# Patient Record
Sex: Female | Born: 1940 | Race: White | Hispanic: No | Smoking: Former smoker
Health system: Southern US, Community
[De-identification: ages and names within clinical notes are randomized; demographics above are authoritative.]

## PROBLEM LIST (undated history)

## (undated) DIAGNOSIS — E7800 Pure hypercholesterolemia, unspecified: Secondary | ICD-10-CM

## (undated) DIAGNOSIS — F32A Depression, unspecified: Secondary | ICD-10-CM

## (undated) DIAGNOSIS — E119 Type 2 diabetes mellitus without complications: Secondary | ICD-10-CM

## (undated) DIAGNOSIS — I10 Essential (primary) hypertension: Secondary | ICD-10-CM

## (undated) DIAGNOSIS — I4891 Unspecified atrial fibrillation: Secondary | ICD-10-CM

## (undated) DIAGNOSIS — I480 Paroxysmal atrial fibrillation: Secondary | ICD-10-CM

## (undated) DIAGNOSIS — N189 Chronic kidney disease, unspecified: Secondary | ICD-10-CM

## (undated) DIAGNOSIS — F329 Major depressive disorder, single episode, unspecified: Secondary | ICD-10-CM

## (undated) DIAGNOSIS — I639 Cerebral infarction, unspecified: Secondary | ICD-10-CM

## (undated) DIAGNOSIS — I219 Acute myocardial infarction, unspecified: Secondary | ICD-10-CM

## (undated) DIAGNOSIS — G4733 Obstructive sleep apnea (adult) (pediatric): Secondary | ICD-10-CM

## (undated) DIAGNOSIS — K219 Gastro-esophageal reflux disease without esophagitis: Secondary | ICD-10-CM

## (undated) DIAGNOSIS — E1142 Type 2 diabetes mellitus with diabetic polyneuropathy: Secondary | ICD-10-CM

## (undated) DIAGNOSIS — I5189 Other ill-defined heart diseases: Secondary | ICD-10-CM

## (undated) DIAGNOSIS — G4730 Sleep apnea, unspecified: Secondary | ICD-10-CM

## (undated) DIAGNOSIS — I2510 Atherosclerotic heart disease of native coronary artery without angina pectoris: Secondary | ICD-10-CM

## (undated) DIAGNOSIS — Z955 Presence of coronary angioplasty implant and graft: Secondary | ICD-10-CM

## (undated) HISTORY — PX: CYSTOSCOPY: SUR368

---

## 2009-03-10 ENCOUNTER — Ambulatory Visit: Payer: Self-pay | Admitting: Unknown Physician Specialty

## 2009-03-30 ENCOUNTER — Ambulatory Visit: Payer: Self-pay | Admitting: Unknown Physician Specialty

## 2009-04-14 ENCOUNTER — Encounter: Payer: Self-pay | Admitting: Podiatrist

## 2009-04-16 ENCOUNTER — Ambulatory Visit: Payer: Self-pay | Admitting: Unknown Physician Specialty

## 2009-05-17 ENCOUNTER — Ambulatory Visit: Payer: Self-pay | Admitting: Unknown Physician Specialty

## 2009-07-18 ENCOUNTER — Ambulatory Visit: Payer: Self-pay | Admitting: Family Medicine

## 2009-12-07 ENCOUNTER — Ambulatory Visit: Payer: Self-pay | Admitting: Family Medicine

## 2010-10-02 ENCOUNTER — Ambulatory Visit: Payer: Self-pay | Admitting: Internal Medicine

## 2011-06-14 ENCOUNTER — Ambulatory Visit: Payer: Self-pay | Admitting: Internal Medicine

## 2011-06-21 DIAGNOSIS — I10 Essential (primary) hypertension: Secondary | ICD-10-CM | POA: Insufficient documentation

## 2011-06-21 DIAGNOSIS — E7800 Pure hypercholesterolemia, unspecified: Secondary | ICD-10-CM | POA: Insufficient documentation

## 2011-06-21 DIAGNOSIS — F411 Generalized anxiety disorder: Secondary | ICD-10-CM | POA: Insufficient documentation

## 2011-09-26 ENCOUNTER — Ambulatory Visit: Payer: Self-pay | Admitting: Family Medicine

## 2011-09-28 DIAGNOSIS — E1142 Type 2 diabetes mellitus with diabetic polyneuropathy: Secondary | ICD-10-CM | POA: Insufficient documentation

## 2011-09-28 DIAGNOSIS — E1129 Type 2 diabetes mellitus with other diabetic kidney complication: Secondary | ICD-10-CM | POA: Insufficient documentation

## 2011-09-28 DIAGNOSIS — K219 Gastro-esophageal reflux disease without esophagitis: Secondary | ICD-10-CM | POA: Insufficient documentation

## 2011-11-23 DIAGNOSIS — M1710 Unilateral primary osteoarthritis, unspecified knee: Secondary | ICD-10-CM | POA: Insufficient documentation

## 2013-01-19 DIAGNOSIS — M8580 Other specified disorders of bone density and structure, unspecified site: Secondary | ICD-10-CM | POA: Insufficient documentation

## 2013-07-08 ENCOUNTER — Ambulatory Visit: Payer: Self-pay | Admitting: Family Medicine

## 2013-09-26 ENCOUNTER — Inpatient Hospital Stay: Payer: Self-pay | Admitting: Internal Medicine

## 2013-09-26 LAB — CBC
HCT: 46.5 % (ref 35.0–47.0)
MCH: 28.6 pg (ref 26.0–34.0)
MCHC: 33.8 g/dL (ref 32.0–36.0)
MCV: 85 fL (ref 80–100)
Platelet: 249 10*3/uL (ref 150–440)
RBC: 5.49 10*6/uL — ABNORMAL HIGH (ref 3.80–5.20)
RDW: 14.1 % (ref 11.5–14.5)

## 2013-09-26 LAB — TROPONIN I: Troponin-I: <0.02 ng/mL

## 2013-09-26 LAB — URINALYSIS, COMPLETE
Bacteria: NONE SEEN
Blood: NEGATIVE
Glucose,UR: NEGATIVE mg/dL (ref 0–75)
Ketone: NEGATIVE
Nitrite: NEGATIVE
Ph: 7 (ref 4.5–8.0)
RBC,UR: 2 /HPF (ref 0–5)

## 2013-09-26 LAB — COMPREHENSIVE METABOLIC PANEL
Albumin: 3.9 g/dL (ref 3.4–5.0)
Alkaline Phosphatase: 121 U/L (ref 50–136)
Anion Gap: 10 (ref 7–16)
Bilirubin,Total: 0.3 mg/dL (ref 0.2–1.0)
Co2: 26 mmol/L (ref 21–32)
EGFR (Non-African Amer.): >60
Glucose: 153 mg/dL — ABNORMAL HIGH (ref 65–99)
Osmolality: 283 (ref 275–301)
Potassium: 2.9 mmol/L — ABNORMAL LOW (ref 3.5–5.1)
Sodium: 140 mmol/L (ref 136–145)

## 2013-09-26 LAB — MAGNESIUM: Magnesium: 0.9 mg/dL — ABNORMAL LOW

## 2013-09-27 LAB — BASIC METABOLIC PANEL
Calcium, Total: 8.5 mg/dL (ref 8.5–10.1)
Creatinine: 0.93 mg/dL (ref 0.60–1.30)
EGFR (Non-African Amer.): >60
Glucose: 171 mg/dL — ABNORMAL HIGH (ref 65–99)
Potassium: 3.5 mmol/L (ref 3.5–5.1)
Sodium: 141 mmol/L (ref 136–145)

## 2013-09-27 LAB — CBC WITH DIFFERENTIAL/PLATELET
Eosinophil #: 0.1 10*3/uL (ref 0.0–0.7)
HGB: 14.3 g/dL (ref 12.0–16.0)
Lymphocyte %: 11.8 %
MCH: 28.5 pg (ref 26.0–34.0)
Neutrophil #: 7.3 10*3/uL — ABNORMAL HIGH (ref 1.4–6.5)
Neutrophil %: 76.1 %
Platelet: 239 10*3/uL (ref 150–440)
RBC: 5.02 10*6/uL (ref 3.80–5.20)
RDW: 13.9 % (ref 11.5–14.5)
WBC: 9.7 10*3/uL (ref 3.6–11.0)

## 2013-09-27 LAB — CK TOTAL AND CKMB (NOT AT ARMC)
CK, Total: 44 U/L (ref 21–215)
CK-MB: 1 ng/mL (ref 0.5–3.6)

## 2013-09-27 LAB — TROPONIN I
Troponin-I: 0.04 ng/mL
Troponin-I: <0.02 ng/mL

## 2013-09-27 LAB — MAGNESIUM: Magnesium: 1.7 mg/dL — ABNORMAL LOW

## 2013-12-07 ENCOUNTER — Ambulatory Visit: Payer: Self-pay | Admitting: Family Medicine

## 2013-12-07 LAB — URINALYSIS, COMPLETE
Bilirubin,UR: NEGATIVE
Glucose,UR: NEGATIVE mg/dL (ref 0–75)
Ketone: NEGATIVE
Ph: 6.5 (ref 4.5–8.0)
Protein: 100
Specific Gravity: 1.01 (ref 1.003–1.030)
Squamous Epithelial: NONE SEEN

## 2013-12-09 LAB — URINE CULTURE

## 2014-01-20 DIAGNOSIS — I4891 Unspecified atrial fibrillation: Secondary | ICD-10-CM | POA: Insufficient documentation

## 2014-01-20 DIAGNOSIS — M19039 Primary osteoarthritis, unspecified wrist: Secondary | ICD-10-CM | POA: Insufficient documentation

## 2014-01-20 DIAGNOSIS — I480 Paroxysmal atrial fibrillation: Secondary | ICD-10-CM | POA: Insufficient documentation

## 2014-02-22 ENCOUNTER — Other Ambulatory Visit: Payer: Self-pay | Admitting: Cardiology

## 2014-02-22 LAB — TROPONIN I: Troponin-I: <0.02 ng/mL

## 2014-03-29 ENCOUNTER — Ambulatory Visit: Payer: Self-pay | Admitting: Physician Assistant

## 2014-03-29 LAB — URIC ACID: URIC ACID: 7 mg/dL — AB (ref 2.6–6.0)

## 2014-11-08 DIAGNOSIS — N329 Bladder disorder, unspecified: Secondary | ICD-10-CM | POA: Insufficient documentation

## 2015-04-08 NOTE — Discharge Summary (Signed)
PATIENT NAME:  Vanessa Young, Vanessa Young MR#:  [id number] DATE OF BIRTH:  [birth year]  DATE OF ADMISSION:  09/26/2013 DATE OF DISCHARGE:  09/27/2013  ADMISSION DIAGNOSIS: New-onset atrial fibrillation.   DISCHARGE DIAGNOSIS:  New-onset atrial fibrillation.   CONSULTATIONS: Cardiology.   IMAGING AND RADIOLOGIC DATA:   1.  A 2-D echocardiogram is pending and will be followed up with Dr. Ubaldo Glassing.  2.  Troponins were negative.  3.  Discharge white blood cells 9.7, hemoglobin 14, hematocrit 43, platelets 239.  4.  Sodium 141, potassium 3.5, chloride 106, bicarb 28, BUN 12, creatinine 0.93, glucose 171.  5.  TSH was within normal limits.   HOSPITAL COURSE: This is a 74 year old female with new-onset atrial fibrillation. For further details, please refer to the H and P.  1.  Atrial fibrillation. The patient was given diltiazem x 2 in the ER. She was converted to normal sinus rhythm. Cardiology was consulted. Echocardiogram was ordered. The unofficial read of the echocardiogram is that it is essentially normal. She was placed on telemetry where she did not have any bouts of atrial fibrillation. Her CHADSS-VASC score is 4, therefore, cardiology recommended Xarelto 20 mg daily. I have explained the risks, benefits and alternatives of blood thinners such as Xarelto with the patient; risks including bleeding and hemorrhage. The patient understands these risks and is willing to take the risk. Her echo unofficial report does show a normal ejection fraction and no valvular abnormalities. She is also being discharged on Cardizem.    2.  History of hypertension. The patient will stop atenolol. Continue Cardizem, continue losartan and guanfacine.  Further followup with Dr. Ubaldo Glassing.  3.  Hyperlipidemia, on Crestor.  4.  Hypomagnesemia which was repleted.  5.  Diabetes. The patient will continue outpatient medications.   DISCHARGE MEDICATIONS: 1.  Metformin 500 mg daily.  2.  Losartan 100 mg daily.  3.  Fish oil 1 tablet b.i.d.   4.  Vitamin D3 1 tablet b.i.d.  5.  Guanfacine 4 mg daily.  6.  Lexapro 5 mg Monday, Wednesday, Friday.  7.  Crestor 5 mg Monday and Thursday.  8.  Omeprazole 10 mg in the evening.  9.  Xarelto 20 mg daily.  10.  Diltiazem 180 mg daily.  11.  Magnesium oxide 400 mg daily.   DISCHARGE DIET: Low sodium, ADA diet.   DISCHARGE ACTIVITY: As tolerated.   DISCHARGE FOLLOWUP: The patient will follow up Dr. Ubaldo Glassing in 2 week and Dr. Algie Coffer, her primary care physician, in 2 weeks.   TIME SPENT: Approximately 35 minutes.   The patient is medically stable for discharge.   ____________________________ Sheela Mcculley P. Benjie Karvonen, MD spm:cs D: 09/27/2013 12:49:00 ET T: 09/27/2013 14:36:21 ET JOB#: [id number]  cc: Chrissie Noa A. Ubaldo Glassing, MD Deshonda Cryderman P. Benjie Karvonen, MD, <Dictator>  Donell Beers Chastin Garlitz MD ELECTRONICALLY SIGNED 09/27/2013 19:59

## 2015-04-08 NOTE — Consult Note (Signed)
   Present Illness 74 yo female with history of diabetes melitus, hypertension who was admitted with rapid irregular heart rate and noted to be in atrial fibrillation. She recieved iv cardizem and converted to nsr. Her serum magnesium was 0.9 and her serum potassium was 2.9. She had been on 10 mg of oral lasix for her blood pressure after being taken off of hctz due to gout flare up. She denies chest pain, syncope or presyncope. SHe is currently stable in nsr. No contraindication to anticoagulation. Her CHADSS score is 2-3 and her CHADSS-VaSC is 4.   Physical Exam:  GEN no acute distress   HEENT PERRL, hearing intact to voice   NECK No masses   RESP normal resp effort  clear BS  no use of accessory muscles   CARD Regular rate and rhythm  No murmur   ABD denies tenderness  normal BS   LYMPH negative neck   EXTR negative edema   SKIN normal to palpation   NEURO cranial nerves intact, motor/sensory function intact   PSYCH A+O to time, place, person   Review of Systems:  General: No Complaints   Skin: No Complaints   ENT: No Complaints   Eyes: No Complaints   Neck: No Complaints   Respiratory: No Complaints   Cardiovascular: No Complaints   Gastrointestinal: No Complaints   Genitourinary: No Complaints   Vascular: No Complaints   Musculoskeletal: No Complaints   Neurologic: No Complaints   Hematologic: No Complaints   Endocrine: No Complaints   Psychiatric: No Complaints   Review of Systems: All other systems were reviewed and found to be negative   Medications/Allergies Reviewed Medications/Allergies reviewed   EKG:  EKG NSR   Interpretation sinus rhtyhm currently. Afib with rvr on admission    Penicillin: Hives  Keflex: Unknown   Impression 74 yo female with history of hypertension and diabetes melitus who developed afib with rvr. APpears to be in the setting of hypokalemia and hypomagnasemia. Has ruled out for mi and is currently in nsr on  cardeizem. Will repleat ka dn mg and discontinue atenolol and continue with cardizem cd 180 daily. Discussed risk and benefits of anticoagulation with warfarin vs novel agents. CHADSS score is 2-3 (age 81), and CHADSS-VASc of 4. Will place on Xarelto. 20 mg dialy for now. ECHO revealed normal lv function with normal atrial size and no significant valvular abnormalities.   Plan 1. OK for discharge on Cardizem CD 180 mg daily; Losartan 100 mg daily; Magnesium oxylate 250 mg daily; Xarelto 20 mg daily 2. Discontinue lasix and atenolol from admission meds 3. Ambulate and discharge to home. Will follow up as outpatient if desired.   Electronic Signatures: Teodoro Spray (MD)  (Signed 12-Oct-14 12:10)  Authored: General Aspect/Present Illness, History and Physical Exam, Review of System, EKG , Allergies, Impression/Plan   Last Updated: 12-Oct-14 12:10 by Teodoro Spray (MD)

## 2015-04-08 NOTE — H&P (Signed)
PATIENT NAME:  Vanessa Young, Vanessa Young MR#:  [id number] DATE OF BIRTH:  Apr 25, 1941  DATE OF ADMISSION:  09/26/2013  PRIMARY CARE PHYSICIAN: Dr. Myrtie Soman, [facility]   CHIEF COMPLAINT: Palpitations.   HISTORY OF PRESENT ILLNESS: This is a 74 year old female who presents to the hospital, as she developed palpitations earlier this afternoon around 2:00. The patient was in her usual state of health, sitting in her living room watching TV, when she started to feel palpitations and she became a bit short of breath. She was a bit concerned; therefore, came to the ER for further evaluation. At triage, the patient was noted to be in SVT and rapid AFib, with heart rates in the 160s. The patient was given 2 doses of IV Cardizem, and since then has converted to a normal sinus rhythm. The patient denied any chest pain with these symptoms. She denies any dizziness. She denied any nausea, vomiting, any numbness, tingling, or any other associated symptoms presently. The patient does not have any previous history of atrial fibrillation or SVT. The patient does say that her blood pressures have been significantly uncontrolled, so her primary care physician has been increasing her antihypertensive meds over the past few weeks.   REVIEW OF SYSTEMS:   CONSTITUTIONAL: No documented fever. No weight gain or weight loss.  EYES: No blurry or double vision.  ENT: No tinnitus. No postnasal drip. No redness of the oropharynx.  RESPIRATORY: No cough, no wheeze, no hemoptysis. Positive dyspnea.  CARDIOVASCULAR: No chest pain, no orthopnea. Positive palpitations. No true syncope.  GASTROINTESTINAL: No nausea, vomiting, diarrhea. No abdominal pain. No melena or hematochezia.  GENITOURINARY: No dysuria, no hematuria.  ENDOCRINE: No polyuria or nocturia. No heat or cold intolerance.  HEMATOLOGIC: No anemia, no bruising, no bleeding.  INTEGUMENT: No rashes. No lesions.  MUSCULOSKELETAL: No arthritis, no swelling, no gout.   NEUROLOGIC: No numbness or tingling. No ataxia. No seizure-type activity.  PSYCHIATRIC: No anxiety. No insomnia. No ADD. Positive depression.   PAST MEDICAL HISTORY: Consistent with diabetes, hypertension, hyperlipidemia, GERD, depression.   ALLERGIES: KEFLEX and PENICILLIN, which cause a rash.   SOCIAL HISTORY: Does have a history of smoking, but quit 20+ years ago. No alcohol abuse. No illicit drug abuse. Lives at home by herself.   FAMILY HISTORY: Mother and father are both deceased. Mother died from a cerebral hemorrhage. Father died from heart disease.   CURRENT MEDICATIONS ARE AS FOLLOWS:  Atenolol 25 mg in the morning, 50 mg in the evening. Metformin 500 mg daily. Lasix 10 mg daily. Losartan 100 mg daily. Fish oil b.i.d. Vitamin D twice daily. Lexapro 5 mg at bedtime. Crestor 5 mg on Mondays and Thursdays only. Tenex 4 mg daily. Prilosec 20 mg daily.   PHYSICAL EXAMINATION PRESENTLY IS AS FOLLOWS: VITAL SIGNS ARE NOTED TO BE: Temperature 98.2, pulse 82, respirations 18, blood pressure 185/92, sats 99% on room air.  GENERAL: She is a pleasant-appearing female in no apparent distress.  HEAD EYES, EARS, NOSE, THROAT EXAM: The patient is atraumatic, normocephalic. Extraocular muscles are intact. Pupils equal and reactive to light. Sclerae anicteric. No conjunctival injection. No pharyngeal erythema.  NECK: Supple. There is no jugular venous distention, no bruits, no lymphadenopathy, no thyromegaly.  HEART EXAM: Regular rate and rhythm. No murmurs. No rubs. No clicks.  LUNGS: Clear to auscultation bilaterally. No rales or rhonchi. No wheezes.  ABDOMEN: Soft, flat, nontender, nondistended. Has good bowel sounds. No hepatosplenomegaly appreciated.  EXTREMITIES: No evidence of any cyanosis, clubbing,  or peripheral edema. Has +2 pedal and radial pulses bilaterally.  NEUROLOGIC: The patient is alert, awake, oriented x 3, with no focal motor or sensory defecits  bilaterally.  SKIN: Moist and  warm, with no rashes appreciated.  LYMPHATIC: There is no cervical or axillary lymphadenopathy.   LABORATORY EXAM: Shows serum glucose of 153, BUN 14, creatinine 0.8, sodium 140, potassium 2.9, chloride 104, bicarb 26, magnesium is 0.9. The patient's LFTs are within normal limits. Troponin less than 0.02. White cell count 9.5, hemoglobin 15.7, hematocrit 46.5, platelet count 249. Urinalysis within normal limits.   The patient did have a chest x-ray done, which showed no acute cardiopulmonary disease of the chest.   ASSESSMENT AND PLAN: This is a 74 year old female with a history of diabetes, hypertension, depression, gastroesophageal reflux disease, hyperlipidemia, who presents to the hospital due to palpitations, and noted to be in new-onset atrial fibrillation.   1.  New-onset atrial fibrillation. This is likely cause of patient's palpitations. The exact etiology of the AFib is unclear, whether it is related to uncontrolled hypertension. The patient has now converted to a normal sinus rhythm after 2 doses of IV Cardizem. For now, I will continue atenolol and oral Cardizem for rate control. The patient's CHADS-VASC score is 2. She has an annual 4% risk for stroke. For now, I will continue the patient on just some aspirin. Await further cardiology evaluation for long-term anticoagulation. Will get a 2-dimensional echocardiogram, cycle her cardiac enzymes. I discussed the case with Dr. Ubaldo Glassing, who will see the patient tomorrow. Keep her on telemetry for now.   2.  Diabetes. The patient's blood sugars are under stable control. I will continue her metformin and place her on some sliding scale insulin. Continue with carb-control diet.   3.  Hypertension. Continue atenolol, continue Tenex, continue losartan.   4.  Gastroesophageal reflux disease. Continue Prilosec.    5.  Hyperlipidemia. Continue Crestor.  6.  Hypokalemia and hypomagnesemia. Will replace the electrolytes accordingly with both IV and oral  magnesium and potassium, and repeat levels in the morning.   The patient is a FULL CODE.   Time spent on admission is 50 minutes.     ____________________________ Belia Heman. Verdell Carmine, MD vjs:mr D: 09/26/2013 17:46:00 ET T: 09/26/2013 18:50:25 ET JOB#: [id number]  cc: Belia Heman. Verdell Carmine, MD, <Dictator> Henreitta Leber MD ELECTRONICALLY SIGNED 09/27/2013 13:37

## 2015-05-22 ENCOUNTER — Encounter: Payer: Self-pay | Admitting: Gynecology

## 2015-05-22 ENCOUNTER — Ambulatory Visit
Admission: EM | Admit: 2015-05-22 | Discharge: 2015-05-22 | Disposition: A | Discharge status: Home / Self Care | Payer: Medicare Other | Attending: Family Medicine | Admitting: Family Medicine

## 2015-05-22 DIAGNOSIS — A499 Bacterial infection, unspecified: Secondary | ICD-10-CM

## 2015-05-22 DIAGNOSIS — N390 Urinary tract infection, site not specified: Secondary | ICD-10-CM

## 2015-05-22 DIAGNOSIS — B9689 Other specified bacterial agents as the cause of diseases classified elsewhere: Secondary | ICD-10-CM

## 2015-05-22 DIAGNOSIS — N760 Acute vaginitis: Secondary | ICD-10-CM | POA: Insufficient documentation

## 2015-05-22 HISTORY — DX: Unspecified atrial fibrillation: I48.91

## 2015-05-22 HISTORY — DX: Essential (primary) hypertension: I10

## 2015-05-22 HISTORY — DX: Pure hypercholesterolemia, unspecified: E78.00

## 2015-05-22 HISTORY — DX: Gastro-esophageal reflux disease without esophagitis: K21.9

## 2015-05-22 HISTORY — DX: Type 2 diabetes mellitus without complications: E11.9

## 2015-05-22 HISTORY — DX: Major depressive disorder, single episode, unspecified: F32.9

## 2015-05-22 HISTORY — DX: Depression, unspecified: F32.A

## 2015-05-22 LAB — URINALYSIS COMPLETE WITH MICROSCOPIC (ARMC ONLY)
GLUCOSE, UA: NEGATIVE mg/dL
NITRITE: POSITIVE — AB
PH: 6 (ref 5.0–8.0)
Specific Gravity, Urine: 1.02 (ref 1.005–1.030)

## 2015-05-22 LAB — WET PREP, GENITAL
Trich, Wet Prep: NONE SEEN
Yeast Wet Prep HPF POC: NONE SEEN

## 2015-05-22 MED ORDER — NITROFURANTOIN MONOHYD MACRO 100 MG PO CAPS: 100.0000 mg | ORAL_CAPSULE | Freq: Two times a day (BID) | ORAL | Status: DC

## 2015-05-22 MED ORDER — PHENAZOPYRIDINE HCL 100 MG PO TABS: 200.0000 mg | ORAL_TABLET | Freq: Three times a day (TID) | ORAL | Status: DC | PRN

## 2015-05-22 MED ORDER — METRONIDAZOLE 0.75 % VA GEL: 1.0000 | Freq: Every day | VAGINAL | Status: DC

## 2015-05-22 NOTE — ED Provider Notes (Signed)
CSN: [id number]     Arrival date & time 05/22/15  0935 History   First MD Initiated Contact with Patient 05/22/15 1014     Chief Complaint  Patient presents with  . Urinary Tract Infection  . Vaginitis   (Consider location/radiation/quality/duration/timing/severity/associated sxs/prior Treatment) HPI Comments: Caucasian female with new diagnosis bladder cancer last year has cystoscopy and lesion ablation with urology and follow up CT scan scheduled for tomorrow.  Urinary odor started Friday 3 Jun, suprapubic pain, cloudy urine, headache for one week, urinary frequency and suprapubic pressure.  Last vaginal yeast infection 2008 monistat cleared but no improvement with OTC treatment x 24 hours  Patient is a 74 y.o. female presenting with urinary tract infection. The history is provided by the patient.  Urinary Tract Infection Pain severity:  Mild Onset quality:  Sudden Duration:  3 days Timing:  Constant Progression:  Unchanged Chronicity:  New Recent urinary tract infections: no   Relieved by:  Nothing Worsened by:  Nothing tried Ineffective treatments:  Cranberry juice Urinary symptoms: foul-smelling urine and frequent urination   Urinary symptoms: no discolored urine, no hematuria, no hesitancy and no bladder incontinence   Associated symptoms: abdominal pain and vaginal discharge   Associated symptoms: no fever, no flank pain, no genital lesions, no nausea and no vomiting   Abdominal pain:    Location:  Suprapubic   Quality:  Pressure   Severity:  Mild   Onset quality:  Sudden   Duration:  3 days   Timing:  Constant   Progression:  Unchanged   Chronicity:  New Vaginal discharge:    Quality:  Thick and white   Severity:  Mild   Onset quality:  Sudden   Duration:  1 day   Timing:  Constant   Progression:  Unchanged   Chronicity:  New Risk factors: sexually active   Risk factors: no hx of pyelonephritis, no hx of urolithiasis, no kidney transplant, not pregnant, no  recurrent urinary tract infections, no renal cysts, no renal disease, not single kidney, no sexually transmitted infections and no urinary catheter     Past Medical History  Diagnosis Date  . A-fib   . Hypertension   . Diabetes mellitus without complication   . GERD (gastroesophageal reflux disease)   . Hypercholesteremia   . Depression    Past Surgical History  Procedure Laterality Date  . Cystoscopy     No family history on file. History  Substance Use Topics  . Smoking status: Never Smoker   . Smokeless tobacco: Not on file  . Alcohol Use: No   OB History    No data available     Review of Systems  Constitutional: Negative for fever, chills, diaphoresis, activity change, appetite change and fatigue.  HENT: Negative for congestion, ear pain, mouth sores and trouble swallowing.   Eyes: Negative for pain, discharge, redness and itching.  Gastrointestinal: Positive for abdominal pain. Negative for nausea, vomiting, diarrhea, constipation, blood in stool, abdominal distention and rectal pain.  Endocrine: Negative for cold intolerance and heat intolerance.  Genitourinary: Positive for dysuria, frequency and vaginal discharge. Negative for urgency, hematuria, flank pain, decreased urine volume, vaginal bleeding, difficulty urinating, genital sores, vaginal pain, menstrual problem and pelvic pain.  Musculoskeletal: Negative for myalgias, back pain, joint swelling, arthralgias, gait problem, neck pain and neck stiffness.  Skin: Negative for color change, pallor, rash and wound.  Allergic/Immunologic: Negative for environmental allergies and food allergies.  Neurological: Positive for headaches. Negative for dizziness, tremors, syncope,  facial asymmetry, weakness and light-headedness.  Hematological: Negative for adenopathy. Does not bruise/bleed easily.  Psychiatric/Behavioral: Negative for behavioral problems, confusion, sleep disturbance and agitation.    Allergies  Keflex and  Penicillins  Home Medications   Prior to Admission medications   Medication Sig Start Date End Date Taking? Authorizing Provider  diltiazem (CARDIZEM) 120 MG tablet Take 120 mg by mouth 4 (four) times daily.   Yes Historical Provider, MD  escitalopram (LEXAPRO) 5 MG tablet Take 5 mg by mouth daily.   Yes Historical Provider, MD  guanFACINE (TENEX) 1 MG tablet Take 1 mg by mouth at bedtime.   Yes Historical Provider, MD  losartan (COZAAR) 100 MG tablet Take 100 mg by mouth daily.   Yes Historical Provider, MD  magnesium oxide (MAG-OX) 400 MG tablet Take 400 mg by mouth daily.   Yes Historical Provider, MD  metFORMIN (GLUMETZA) 500 MG (MOD) 24 hr tablet Take 500 mg by mouth daily with breakfast.   Yes Historical Provider, MD  omeprazole (PRILOSEC) 20 MG capsule Take 20 mg by mouth daily.   Yes Historical Provider, MD  rivaroxaban (XARELTO) 20 MG TABS tablet Take 20 mg by mouth daily with supper.   Yes Historical Provider, MD  rosuvastatin (CRESTOR) 5 MG tablet Take 5 mg by mouth daily.   Yes Historical Provider, MD  metroNIDAZOLE (METROGEL) 0.75 % vaginal gel Place 1 Applicatorful vaginally at bedtime. For 7 days 05/22/15   Olen Cordial, NP  nitrofurantoin, macrocrystal-monohydrate, (MACROBID) 100 MG capsule Take 1 capsule (100 mg total) by mouth 2 (two) times daily. 05/22/15   Olen Cordial, NP  phenazopyridine (PYRIDIUM) 100 MG tablet Take 2 tablets (200 mg total) by mouth 3 (three) times daily as needed for pain. 05/22/15   Aura Fey Betancourt, NP   BP 145/76 mmHg  Pulse 81  Temp(Src) 98.4 F (36.9 C) (Oral)  Ht 4\' 10"  (1.473 m)  Wt 137 lb (62.143 kg)  BMI 28.64 kg/m2  SpO2 98% Physical Exam  Constitutional: She is oriented to person, place, and time. Vital signs are normal. She appears well-developed and well-nourished.  HENT:  Head: Normocephalic and atraumatic.  Right Ear: External ear normal.  Left Ear: External ear normal.  Nose: Nose normal.  Mouth/Throat: Oropharynx is  clear and moist. No oropharyngeal exudate.  Eyes: Conjunctivae, EOM and lids are normal. Pupils are equal, round, and reactive to light. Right eye exhibits no discharge. Left eye exhibits no discharge. No scleral icterus.  Neck: Trachea normal and normal range of motion. Neck supple. No tracheal deviation present. No thyromegaly present.  Cardiovascular: Normal rate, regular rhythm, normal heart sounds and intact distal pulses.  Exam reveals no gallop and no friction rub.   No murmur heard. Pulmonary/Chest: Effort normal and breath sounds normal. No respiratory distress. She has no wheezes. She has no rales. She exhibits no tenderness.  Abdominal: Soft. Bowel sounds are normal. She exhibits no shifting dullness, no distension, no pulsatile liver, no fluid wave, no abdominal bruit, no ascites, no pulsatile midline mass and no mass. There is no hepatosplenomegaly. There is no tenderness. There is no rebound, no guarding and no CVA tenderness. No hernia. Hernia confirmed negative in the ventral area.  Dull to percussion x 4 quads  Genitourinary:    Pelvic exam was performed with patient supine. No labial fusion. There is rash on the right labia. There is no tenderness, lesion or injury on the right labia. There is rash on the left labia. There is  no tenderness, lesion or injury on the left labia. No erythema, tenderness or bleeding in the vagina. No foreign body around the vagina. No signs of injury around the vagina. Vaginal discharge found.  Chaperoned by RN Marni Griffon thin coating vaginal walls slight odor cervix with a little thicker discharge at base white slight excoriation vaginal walls  Musculoskeletal: Normal range of motion. She exhibits no edema.  Lymphadenopathy:    She has no cervical adenopathy.       Right: No inguinal adenopathy present.       Left: No inguinal adenopathy present.  Neurological: She is alert and oriented to person, place, and time. She displays normal reflexes.  Coordination normal.  Skin: Skin is warm, dry and intact. No rash noted. No erythema. No pallor.  Psychiatric: She has a normal mood and affect. Her speech is normal and behavior is normal. Judgment and thought content normal. Cognition and memory are normal.  Nursing note and vitals reviewed.   ED Course  Procedures (including critical care time) Labs Review Labs Reviewed  WET PREP, GENITAL - Abnormal; Notable for the following:    Clue Cells Wet Prep HPF POC FEW (*)    WBC, Wet Prep HPF POC MANY (*)    All other components within normal limits  URINALYSIS COMPLETEWITH MICROSCOPIC ([facility] ONLY) - Abnormal; Notable for the following:    APPearance CLOUDY (*)    Bilirubin Urine 1+ (*)    Ketones, ur TRACE (*)    Hgb urine dipstick 3+ (*)    Protein, ur >300 (*)    Nitrite POSITIVE (*)    Leukocytes, UA 3+ (*)    Bacteria, UA FEW (*)    Squamous Epithelial / LPF 0-5 (*)    All other components within normal limits  URINE CULTURE    Imaging Review No results found.   MDM   1. UTI (lower urinary tract infection)   2. Bacterial vaginosis    Patient given copy of urinalysis results and to follow up with urology this week.  Medications as directed.  Patient is also to push fluids and may use Pyridium po as needed. Call or return to clinic as needed if these symptoms worsen or fail to improve as anticipated.  Exitcare handout on cystitis given to patient Patient verbalized agreement and understanding of treatment plan and had no further questions at this time. P2:  Hydrate and cranberry juice  Patient to take metronidazole as directed avoid alcohol intake during entire therapy period.  Exitcare handout on bacterial vaginosis given to patient.  RTC if worsening symptoms or new symptoms.    Patient verbalized understanding and agreed with plan of care and had no further questions at this time.  Patient has taken metronidazole without side effects in the past    Olen Cordial,  NP 05/22/15 1816

## 2015-05-22 NOTE — Discharge Instructions (Signed)
Vaginitis Vaginitis is an inflammation of the vagina. It is most often caused by a change in the normal balance of the bacteria and yeast that live in the vagina. This change in balance causes an overgrowth of certain bacteria or yeast, which causes the inflammation. There are different types of vaginitis, but the most common types are:  Bacterial vaginosis.  Yeast infection (candidiasis).  Trichomoniasis vaginitis. This is a sexually transmitted infection (STI).  Viral vaginitis.  Atropic vaginitis.  Allergic vaginitis. CAUSES  The cause depends on the type of vaginitis. Vaginitis can be caused by:  Bacteria (bacterial vaginosis).  Yeast (yeast infection).  A parasite (trichomoniasis vaginitis)  A virus (viral vaginitis).  Low hormone levels (atrophic vaginitis). Low hormone levels can occur during pregnancy, breastfeeding, or after menopause.  Irritants, such as bubble baths, scented tampons, and feminine sprays (allergic vaginitis). Other factors can change the normal balance of the yeast and bacteria that live in the vagina. These include:  Antibiotic medicines.  Poor hygiene.  Diaphragms, vaginal sponges, spermicides, birth control pills, and intrauterine devices (IUD).  Sexual intercourse.  Infection.  Uncontrolled diabetes.  A weakened immune system. SYMPTOMS  Symptoms can vary depending on the cause of the vaginitis. Common symptoms include:  Abnormal vaginal discharge.  The discharge is white, gray, or yellow with bacterial vaginosis.  The discharge is thick, white, and cheesy with a yeast infection.  The discharge is frothy and yellow or greenish with trichomoniasis.  A bad vaginal odor.  The odor is fishy with bacterial vaginosis.  Vaginal itching, pain, or swelling.  Painful intercourse.  Pain or burning when urinating. Sometimes, there are no symptoms. TREATMENT  Treatment will vary depending on the type of infection.   Bacterial  vaginosis and trichomoniasis are often treated with antibiotic creams or pills.  Yeast infections are often treated with antifungal medicines, such as vaginal creams or suppositories.  Viral vaginitis has no cure, but symptoms can be treated with medicines that relieve discomfort. Your sexual partner should be treated as well.  Atrophic vaginitis may be treated with an estrogen cream, pill, suppository, or vaginal ring. If vaginal dryness occurs, lubricants and moisturizing creams may help. You may be told to avoid scented soaps, sprays, or douches.  Allergic vaginitis treatment involves quitting the use of the product that is causing the problem. Vaginal creams can be used to treat the symptoms. HOME CARE INSTRUCTIONS   Take all medicines as directed by your caregiver.  Keep your genital area clean and dry. Avoid soap and only rinse the area with water.  Avoid douching. It can remove the healthy bacteria in the vagina.  Do not use tampons or have sexual intercourse until your vaginitis has been treated. Use sanitary pads while you have vaginitis.  Wipe from front to back. This avoids the spread of bacteria from the rectum to the vagina.  Let air reach your genital area.  Wear cotton underwear to decrease moisture buildup.  Avoid wearing underwear while you sleep until your vaginitis is gone.  Avoid tight pants and underwear or nylons without a cotton panel.  Take off wet clothing (especially bathing suits) as soon as possible.  Use mild, non-scented products. Avoid using irritants, such as:  Scented feminine sprays.  Fabric softeners.  Scented detergents.  Scented tampons.  Scented soaps or bubble baths.  Practice safe sex and use condoms. Condoms may prevent the spread of trichomoniasis and viral vaginitis. SEEK MEDICAL CARE IF:   You have abdominal pain.  You  have a fever or persistent symptoms for more than 2-3 days.  You have a fever and your symptoms suddenly  get worse. Document Released: 09/30/2007 Document Revised: 08/27/2012 Document Reviewed: 05/15/2012 [facility] Patient Information 2015 [place], [place]. This information is not intended to replace advice given to you by your health care provider. Make sure you discuss any questions you have with your health care provider. Urinary Tract Infection Urinary tract infections (UTIs) can develop anywhere along your urinary tract. Your urinary tract is your body's drainage system for removing wastes and extra water. Your urinary tract includes two kidneys, two ureters, a bladder, and a urethra. Your kidneys are a pair of bean-shaped organs. Each kidney is about the size of your fist. They are located below your ribs, one on each side of your spine. CAUSES Infections are caused by microbes, which are microscopic organisms, including fungi, viruses, and bacteria. These organisms are so small that they can only be seen through a microscope. Bacteria are the microbes that most commonly cause UTIs. SYMPTOMS  Symptoms of UTIs may vary by age and gender of the patient and by the location of the infection. Symptoms in young women typically include a frequent and intense urge to urinate and a painful, burning feeling in the bladder or urethra during urination. Older women and men are more likely to be tired, shaky, and weak and have muscle aches and abdominal pain. A fever may mean the infection is in your kidneys. Other symptoms of a kidney infection include pain in your back or sides below the ribs, nausea, and vomiting. DIAGNOSIS To diagnose a UTI, your caregiver will ask you about your symptoms. Your caregiver also will ask to provide a urine sample. The urine sample will be tested for bacteria and white blood cells. White blood cells are made by your body to help fight infection. TREATMENT  Typically, UTIs can be treated with medication. Because most UTIs are caused by a bacterial infection, they usually can be  treated with the use of antibiotics. The choice of antibiotic and length of treatment depend on your symptoms and the type of bacteria causing your infection. HOME CARE INSTRUCTIONS  If you were prescribed antibiotics, take them exactly as your caregiver instructs you. Finish the medication even if you feel better after you have only taken some of the medication.  Drink enough water and fluids to keep your urine clear or pale yellow.  Avoid caffeine, tea, and carbonated beverages. They tend to irritate your bladder.  Empty your bladder often. Avoid holding urine for long periods of time.  Empty your bladder before and after sexual intercourse.  After a bowel movement, women should cleanse from front to back. Use each tissue only once. SEEK MEDICAL CARE IF:   You have back pain.  You develop a fever.  Your symptoms do not begin to resolve within 3 days. SEEK IMMEDIATE MEDICAL CARE IF:   You have severe back pain or lower abdominal pain.  You develop chills.  You have nausea or vomiting.  You have continued burning or discomfort with urination. MAKE SURE YOU:   Understand these instructions.  Will watch your condition.  Will get help right away if you are not doing well or get worse. Document Released: 09/12/2005 Document Revised: 06/03/2012 Document Reviewed: 01/11/2012 [facility] Patient Information 2015 [place], [place]. This information is not intended to replace advice given to you by your health care provider. Make sure you discuss any questions you have with your health care provider.

## 2015-05-26 LAB — URINE CULTURE: Special Requests: NORMAL

## 2015-06-11 DIAGNOSIS — G4733 Obstructive sleep apnea (adult) (pediatric): Secondary | ICD-10-CM | POA: Insufficient documentation

## 2015-09-30 ENCOUNTER — Emergency Department
Admission: EM | Admit: 2015-09-30 | Discharge: 2015-09-30 | Disposition: A | Discharge status: Home / Self Care | Payer: Medicare Other | Attending: Emergency Medicine | Admitting: Emergency Medicine

## 2015-09-30 ENCOUNTER — Emergency Department: Payer: Medicare Other

## 2015-09-30 DIAGNOSIS — R000 Tachycardia, unspecified: Secondary | ICD-10-CM | POA: Diagnosis not present

## 2015-09-30 DIAGNOSIS — I10 Essential (primary) hypertension: Secondary | ICD-10-CM | POA: Diagnosis not present

## 2015-09-30 DIAGNOSIS — E119 Type 2 diabetes mellitus without complications: Secondary | ICD-10-CM | POA: Insufficient documentation

## 2015-09-30 DIAGNOSIS — Z7901 Long term (current) use of anticoagulants: Secondary | ICD-10-CM | POA: Insufficient documentation

## 2015-09-30 DIAGNOSIS — Z880 Allergy status to penicillin: Secondary | ICD-10-CM | POA: Insufficient documentation

## 2015-09-30 DIAGNOSIS — Z79899 Other long term (current) drug therapy: Secondary | ICD-10-CM | POA: Insufficient documentation

## 2015-09-30 LAB — BASIC METABOLIC PANEL
Anion gap: 11 (ref 5–15)
BUN: 23 mg/dL — ABNORMAL HIGH (ref 6–20)
CALCIUM: 9.3 mg/dL (ref 8.9–10.3)
CO2: 25 mmol/L (ref 22–32)
Chloride: 103 mmol/L (ref 101–111)
Creatinine, Ser: 0.76 mg/dL (ref 0.44–1.00)
GFR calc Af Amer: >60 mL/min (ref >60)
Glucose, Bld: 156 mg/dL — ABNORMAL HIGH (ref 65–99)
Potassium: 4 mmol/L (ref 3.5–5.1)
Sodium: 139 mmol/L (ref 135–145)

## 2015-09-30 LAB — CBC
HCT: 46.2 % (ref 35.0–47.0)
HEMOGLOBIN: 15.2 g/dL (ref 12.0–16.0)
MCH: 27.6 pg (ref 26.0–34.0)
MCHC: 32.8 g/dL (ref 32.0–36.0)
MCV: 84.2 fL (ref 80.0–100.0)
Platelets: 264 10*3/uL (ref 150–440)
RBC: 5.49 MIL/uL — AB (ref 3.80–5.20)
RDW: 14.1 % (ref 11.5–14.5)
WBC: 7.4 10*3/uL (ref 3.6–11.0)

## 2015-09-30 LAB — TROPONIN I

## 2015-09-30 LAB — MAGNESIUM: MAGNESIUM: 1.8 mg/dL (ref 1.7–2.4)

## 2015-09-30 NOTE — ED Notes (Signed)
Pt c/o feeling like her heart is fast and irregular with a hx of afib ..denies pain/SOB.Marland Kitchen

## 2015-09-30 NOTE — ED Provider Notes (Signed)
[facility] Emergency Department Provider Note  Time seen: 9:11 AM  I have reviewed the triage vital signs and the nursing notes.   HISTORY  Chief Complaint Tachycardia    HPI Vanessa Young is a 74 y.o. female with a past medical history of paroxysmal atrial fibrillation, hypertension, gastric reflux, diabetes, hyperlipidemia, depression who presents the emergency department with a fast heart rate. According to the patient overnight she got up to use the restroom and noticed that her heart was racing. She laid back down and her heart continued to race for several minutes before slowing. She was able to go back to sleep, but noted several hours later she had a get up once again to go to the restroom and her heart was racing once again. This morning she felt like it was racing, so she came to the emergency department for evaluation. Currently takes are also for paroxysmal atrial fibrillation. Denies any history of rapid A. fib. Takes diltiazem twice daily including this morning. Currently the patient denies any symptoms, states her heart/chest feels normal. Denies any chest pain, shortness breath, nausea, diaphoresis now or at any time.    Past Medical History  Diagnosis Date  . A-fib ([place])   . Hypertension   . Diabetes mellitus without complication ([place])   . GERD (gastroesophageal reflux disease)   . Hypercholesteremia   . Depression     There are no active problems to display for this patient.   Past Surgical History  Procedure Laterality Date  . Cystoscopy      Current Outpatient Rx  Name  Route  Sig  Dispense  Refill  . diltiazem (CARDIZEM) 120 MG tablet   Oral   Take 120 mg by mouth 4 (four) times daily.         Marland Kitchen escitalopram (LEXAPRO) 5 MG tablet   Oral   Take 5 mg by mouth daily.         Marland Kitchen guanFACINE (TENEX) 1 MG tablet   Oral   Take 1 mg by mouth at bedtime.         Marland Kitchen losartan (COZAAR) 100 MG tablet   Oral   Take 100 mg by mouth  daily.         . magnesium oxide (MAG-OX) 400 MG tablet   Oral   Take 400 mg by mouth daily.         . metFORMIN (GLUMETZA) 500 MG (MOD) 24 hr tablet   Oral   Take 500 mg by mouth daily with breakfast.         . metroNIDAZOLE (METROGEL) 0.75 % vaginal gel   Vaginal   Place 1 Applicatorful vaginally at bedtime. For 7 days   70 g   0   . nitrofurantoin, macrocrystal-monohydrate, (MACROBID) 100 MG capsule   Oral   Take 1 capsule (100 mg total) by mouth 2 (two) times daily.   14 capsule   0   . omeprazole (PRILOSEC) 20 MG capsule   Oral   Take 20 mg by mouth daily.         . phenazopyridine (PYRIDIUM) 100 MG tablet   Oral   Take 2 tablets (200 mg total) by mouth 3 (three) times daily as needed for pain.   6 tablet   0   . rivaroxaban (XARELTO) 20 MG TABS tablet   Oral   Take 20 mg by mouth daily with supper.         . rosuvastatin (CRESTOR) 5  MG tablet   Oral   Take 5 mg by mouth daily.           Allergies Keflex and Penicillins  No family history on file.  Social History Social History  Substance Use Topics  . Smoking status: Never Smoker   . Smokeless tobacco: None  . Alcohol Use: No    Review of Systems Constitutional: Negative for fever. Cardiovascular: Negative for chest pain. Positive heart racing/fast heart rate. Now resolved. Respiratory: Negative for shortness of breath. Gastrointestinal: Negative for abdominal pain, vomiting and diarrhea. Genitourinary: Negative for dysuria. Musculoskeletal: Negative for back pain. Neurological: Negative for headache 10-point ROS otherwise negative.  ____________________________________________   PHYSICAL EXAM:  VITAL SIGNS: ED Triage Vitals  Enc Vitals Group     BP 09/30/15 0842 180/101 mmHg     Pulse Rate 09/30/15 0842 110     Resp 09/30/15 0842 18     Temp 09/30/15 0842 97.6 F (36.4 C)     Temp Source 09/30/15 0842 Oral     SpO2 09/30/15 0842 96 %     Weight 09/30/15 0842 135 lb  (61.236 kg)     Height 09/30/15 0842 4\' 10"  (1.473 m)     Head Cir --      Peak Flow --      Pain Score --      Pain Loc --      Pain Edu? --      Excl. in [place]? --     Constitutional: Alert and oriented. Well appearing and in no distress. Eyes: Normal exam ENT   Head: Normocephalic and atraumatic.   Mouth/Throat: Mucous membranes are moist. Cardiovascular: Normal rate, regular rhythm. No murmur Respiratory: Normal respiratory effort without tachypnea nor retractions. Breath sounds are clear Gastrointestinal: Soft and nontender. No distention.  Musculoskeletal: Nontender with normal range of motion in all extremities. No lower extremity tenderness or edema. Neurologic:  Normal speech and language. No gross focal neurologic deficits Psychiatric: Mood and affect are normal. Speech and behavior are normal. ____________________________________________    EKG  EKG reviewed and interpreted by myself shows sinus tachycardia 102 bpm, narrow QRS, left axis deviation, normal intervals, nonspecific but no concerning ST changes noted.  ____________________________________________    RADIOLOGY  Chest x-ray within normal limits  ____________________________________________    INITIAL IMPRESSION / ASSESSMENT AND PLAN / ED COURSE  Pertinent labs & imaging results that were available during my care of the patient were reviewed by me and considered in my medical decision making (see chart for details).  Patient with intermittent sensation of her heart racing. Denies any symptoms currently. Patient's EKG shows sinus tachycardia around 100 bpm. Patient did note over the last few days she has had lower extremity swelling so she has been taking her fluid pills, which is somewhat atypical for her. We will check labs, and closely monitor in the emergency department on telemetry. Currently the patient appears well with a normal heart rate, regular rhythm.  X-ray within normal limits, labs  within normal limits. Patient continues to feel well, heart rate around 80 bpm. I discussed strict return precautions with the patient, she will follow-up with Dr. Ubaldo Glassing. Patient agreeable to plan.  ____________________________________________   FINAL CLINICAL IMPRESSION(S) / ED DIAGNOSES  Tachycardia   Harvest Dark, MD 09/30/15 1124

## 2015-09-30 NOTE — Discharge Instructions (Signed)
As we have discussed please follow-up with your cardiologist as soon as possible. Return to the emergency department for any further fast heart rate, any chest pain, trouble breathing, or any other symptom personally concerning to yourself.    Nonspecific Tachycardia Tachycardia is a faster than normal heartbeat (more than 100 beats per minute). In adults, the heart normally beats between 60 and 100 times a minute. A fast heartbeat may be a normal response to exercise or stress. It does not necessarily mean that something is wrong. However, sometimes when your heart beats too fast it may not be able to pump enough blood to the rest of your body. This can result in chest pain, shortness of breath, dizziness, and even fainting. Nonspecific tachycardia means that the specific cause or pattern of your tachycardia is unknown. CAUSES  Tachycardia may be harmless or it may be due to a more serious underlying cause. Possible causes of tachycardia include:  Exercise or exertion.  Fever.  Pain or injury.  Infection.  Loss of body fluids (dehydration).  Overactive thyroid.  Lack of red blood cells (anemia).  Anxiety and stress.  Alcohol.  Caffeine.  Tobacco products.  Diet pills.  Illegal drugs.  Heart disease. SYMPTOMS  Rapid or irregular heartbeat (palpitations).  Suddenly feeling your heart beating (cardiac awareness).  Dizziness.  Tiredness (fatigue).  Shortness of breath.  Chest pain.  Nausea.  Fainting. DIAGNOSIS  Your caregiver will perform a physical exam and take your medical history. In some cases, a heart specialist (cardiologist) may be consulted. Your caregiver may also order:  Blood tests.  Electrocardiography. This test records the electrical activity of your heart.  A heart monitoring test. TREATMENT  Treatment will depend on the likely cause of your tachycardia. The goal is to treat the underlying cause of your tachycardia. Treatment methods may  include:  Replacement of fluids or blood through an intravenous (IV) tube for moderate to severe dehydration or anemia.  New medicines or changes in your current medicines.  Diet and lifestyle changes.  Treatment for certain infections.  Stress relief or relaxation methods. HOME CARE INSTRUCTIONS   Rest.  Drink enough fluids to keep your urine clear or pale yellow.  Do not smoke.  Avoid:  Caffeine.  Tobacco.  Alcohol.  Chocolate.  Stimulants such as over-the-counter diet pills or pills that help you stay awake.  Situations that cause anxiety or stress.  Illegal drugs such as marijuana, phencyclidine (PCP), and cocaine.  Only take medicine as directed by your caregiver.  Keep all follow-up appointments as directed by your caregiver. SEEK IMMEDIATE MEDICAL CARE IF:   You have pain in your chest, upper arms, jaw, or neck.  You become weak, dizzy, or feel faint.  You have palpitations that will not go away.  You vomit, have diarrhea, or pass blood in your stool.  Your skin is cool, pale, and wet.  You have a fever that will not go away with rest, fluids, and medicine. MAKE SURE YOU:   Understand these instructions.  Will watch your condition.  Will get help right away if you are not doing well or get worse.   This information is not intended to replace advice given to you by your health care provider. Make sure you discuss any questions you have with your health care provider.   Document Released: 01/10/2005 Document Revised: 02/25/2012 Document Reviewed: 06/17/2015 Elsevier Interactive Patient Education [employer].

## 2015-09-30 NOTE — ED Notes (Signed)
Patient transported to X-ray 

## 2016-01-05 ENCOUNTER — Ambulatory Visit
Admission: EM | Admit: 2016-01-05 | Discharge: 2016-01-05 | Disposition: A | Discharge status: Home / Self Care | Payer: Medicare Other | Attending: Family Medicine | Admitting: Family Medicine

## 2016-01-05 DIAGNOSIS — J40 Bronchitis, not specified as acute or chronic: Secondary | ICD-10-CM | POA: Diagnosis not present

## 2016-01-05 MED ORDER — AZITHROMYCIN 500 MG PO TABS: ORAL_TABLET | ORAL | Status: DC

## 2016-01-05 MED ORDER — FEXOFENADINE-PSEUDOEPHED ER 180-240 MG PO TB24: 1.0000 | ORAL_TABLET | Freq: Every day | ORAL | Status: DC

## 2016-01-05 NOTE — ED Notes (Signed)
Started Sunday with sinus congestion and cough. Not improving and non productive cough worse today

## 2016-01-05 NOTE — Discharge Instructions (Signed)

## 2016-01-05 NOTE — ED Provider Notes (Signed)
CSN: [id number]     Arrival date & time 01/05/16  1212 History   First MD Initiated Contact with Patient 01/05/16 1259    Nurses notes were reviewed. Chief Complaint  Patient presents with  . URI   Patient reports since Sunday she has had hoarseness running nose nasal congestion last night the cough got worse and the hoarseness progressively gotten worse as well. She denies any fever wheezing shortness of breath or chest pain. Stasis though sore but feels more from irritation sinus drainage. (Consider location/radiation/quality/duration/timing/severity/associated sxs/prior Treatment) Patient is a 75 y.o. female presenting with URI. The history is provided by the patient. No language interpreter was used.  URI Presenting symptoms: congestion, cough, rhinorrhea and sore throat   Presenting symptoms: no ear pain, no facial pain, no fatigue and no fever   Severity:  Mild Timing:  Constant Progression:  Worsening Chronicity:  New Ineffective treatments:  None tried Associated symptoms: sinus pain and swollen glands   Associated symptoms: no arthralgias, no headaches, no myalgias, no neck pain and no sneezing   Risk factors: being elderly and recent illness     Past Medical History  Diagnosis Date  . A-fib ([place])   . Hypertension   . Diabetes mellitus without complication ([place])   . GERD (gastroesophageal reflux disease)   . Hypercholesteremia   . Depression    Past Surgical History  Procedure Laterality Date  . Cystoscopy     Family History  Problem Relation Age of Onset  . CVA Mother   . CVA Father    Social History  Substance Use Topics  . Smoking status: Never Smoker   . Smokeless tobacco: None  . Alcohol Use: No   OB History    No data available     Review of Systems  Constitutional: Negative for fever and fatigue.  HENT: Positive for congestion, rhinorrhea and sore throat. Negative for ear pain and sneezing.   Respiratory: Positive for cough.   Musculoskeletal:  Negative for myalgias, arthralgias and neck pain.  Neurological: Negative for headaches.  All other systems reviewed and are negative.   Allergies  Keflex and Penicillins  Home Medications   Prior to Admission medications   Medication Sig Start Date End Date Taking? Authorizing Provider  diltiazem (CARDIZEM) 120 MG tablet Take 120 mg by mouth daily.    Yes Historical Provider, MD  diltiazem (DILACOR XR) 180 MG 24 hr capsule Take 180 mg by mouth every evening.   Yes Historical Provider, MD  escitalopram (LEXAPRO) 5 MG tablet Take 5 mg by mouth at bedtime.    Yes Historical Provider, MD  guanFACINE (TENEX) 1 MG tablet Take 1 mg by mouth 2 (two) times daily.    Yes Historical Provider, MD  losartan (COZAAR) 100 MG tablet Take 100 mg by mouth daily.   Yes Historical Provider, MD  magnesium oxide (MAG-OX) 400 MG tablet Take 400 mg by mouth 2 (two) times daily.    Yes Historical Provider, MD  metFORMIN (GLUMETZA) 500 MG (MOD) 24 hr tablet Take 500 mg by mouth daily with breakfast.   Yes Historical Provider, MD  omeprazole (PRILOSEC) 20 MG capsule Take 20 mg by mouth daily.   Yes Historical Provider, MD  rivaroxaban (XARELTO) 20 MG TABS tablet Take 20 mg by mouth daily with supper.   Yes Historical Provider, MD  rosuvastatin (CRESTOR) 5 MG tablet Take 5 mg by mouth daily. Daily on Monday and Thursday   Yes Historical Provider, MD   Meds Ordered  and Administered this Visit  Medications - No data to display  BP 134/74 mmHg  Pulse 88  Temp(Src) 98 F (36.7 C) (Tympanic)  Resp 17  Ht 4\' 9"  (1.448 m)  Wt 140 lb (63.504 kg)  BMI 30.29 kg/m2  SpO2 97% No data found.   Physical Exam  Constitutional: She is oriented to person, place, and time. She appears well-developed and well-nourished.  HENT:  Head: Normocephalic and atraumatic.  Right Ear: Hearing, tympanic membrane, external ear and ear canal normal.  Left Ear: Hearing, tympanic membrane, external ear and ear canal normal.  Nose:  Mucosal edema and rhinorrhea present. Right sinus exhibits maxillary sinus tenderness. Left sinus exhibits maxillary sinus tenderness.  Mouth/Throat: Uvula is midline. Posterior oropharyngeal erythema present.  Eyes: Conjunctivae are normal. Pupils are equal, round, and reactive to light.  Neck: Normal range of motion. Neck supple.  Cardiovascular: Normal rate and regular rhythm.   Pulmonary/Chest: Breath sounds normal.  Musculoskeletal: Normal range of motion.  Neurological: She is alert and oriented to person, place, and time.  Skin: Skin is warm and dry.  Psychiatric: She has a normal mood and affect. Her behavior is normal. Judgment normal.  Vitals reviewed.   ED Course  Procedures (including critical care time)  Labs Review Labs Reviewed - No data to display  Imaging Review No results found.   Visual Acuity Review  Right Eye Distance:   Left Eye Distance:   Bilateral Distance:    Right Eye Near:   Left Eye Near:    Bilateral Near:         MDM   1. Bronchitis    We'll treat the laryngitis and tracheitis with Zithromax for 5 days, allow her to take her Ladona Ridgel she has a home place on [address] 20 pounds 1 tablet daily.    Frederich Cha, MD 01/05/16 1322

## 2016-02-11 ENCOUNTER — Ambulatory Visit
Admission: EM | Admit: 2016-02-11 | Discharge: 2016-02-11 | Disposition: A | Discharge status: Home / Self Care | Payer: Medicare Other | Attending: Family Medicine | Admitting: Family Medicine

## 2016-02-11 ENCOUNTER — Encounter: Payer: Self-pay | Admitting: Gynecology

## 2016-02-11 DIAGNOSIS — H9222 Otorrhagia, left ear: Secondary | ICD-10-CM | POA: Diagnosis not present

## 2016-02-11 NOTE — ED Notes (Signed)
Patient c/o was seen by her PCP x yesterday and they did an ear lavaged on her left ear. Per patient the doctor use a probe to take some of the wax out  And then flush. Pt. Now with left ear bleeding.

## 2016-02-16 NOTE — ED Provider Notes (Signed)
CSN: [id number]     Arrival date & time 02/11/16  1009 History   First MD Initiated Contact with Patient 02/11/16 1147     Chief Complaint  Patient presents with  . Ear Drainage   (Consider location/radiation/quality/duration/timing/severity/associated sxs/prior Treatment) Patient is a 75 y.o. female presenting with ear drainage. The history is provided by the patient.  Ear Drainage This is a new problem. The current episode started yesterday (after ear lavage and probe by PCP; patient states has noticed intermittent bleeding since last nigh; denies pain or fevers). The problem occurs constantly. The problem has not changed (patient states she takes xarelto as blood thinner) since onset.   Past Medical History  Diagnosis Date  . A-fib ([place])   . Hypertension   . Diabetes mellitus without complication ([place])   . GERD (gastroesophageal reflux disease)   . Hypercholesteremia   . Depression    Past Surgical History  Procedure Laterality Date  . Cystoscopy     Family History  Problem Relation Age of Onset  . CVA Mother   . CVA Father    Social History  Substance Use Topics  . Smoking status: Never Smoker   . Smokeless tobacco: None  . Alcohol Use: No   OB History    No data available     Review of Systems  Allergies  Keflex and Penicillins  Home Medications   Prior to Admission medications   Medication Sig Start Date End Date Taking? Authorizing Provider  diltiazem (CARDIZEM) 120 MG tablet Take 120 mg by mouth daily.    Yes Historical Provider, MD  diltiazem (DILACOR XR) 180 MG 24 hr capsule Take 180 mg by mouth every evening.   Yes Historical Provider, MD  escitalopram (LEXAPRO) 5 MG tablet Take 5 mg by mouth at bedtime.    Yes Historical Provider, MD  fexofenadine-pseudoephedrine (ALLEGRA-D ALLERGY & CONGESTION) 180-240 MG 24 hr tablet Take 1 tablet by mouth daily. 01/05/16  Yes Frederich Cha, MD  guanFACINE (TENEX) 1 MG tablet Take 1 mg by mouth 2 (two) times daily.     Yes Historical Provider, MD  losartan (COZAAR) 100 MG tablet Take 100 mg by mouth daily.   Yes Historical Provider, MD  magnesium oxide (MAG-OX) 400 MG tablet Take 400 mg by mouth 2 (two) times daily.    Yes Historical Provider, MD  metFORMIN (GLUMETZA) 500 MG (MOD) 24 hr tablet Take 500 mg by mouth daily with breakfast.   Yes Historical Provider, MD  omeprazole (PRILOSEC) 20 MG capsule Take 20 mg by mouth daily.   Yes Historical Provider, MD  rivaroxaban (XARELTO) 20 MG TABS tablet Take 20 mg by mouth daily with supper.   Yes Historical Provider, MD  rosuvastatin (CRESTOR) 5 MG tablet Take 5 mg by mouth daily. Daily on Monday and Thursday   Yes Historical Provider, MD  azithromycin (ZITHROMAX) 500 MG tablet 1 tablet daily for 5 days 01/05/16   Frederich Cha, MD   Meds Ordered and Administered this Visit  Medications - No data to display  BP 145/68 mmHg  Pulse 72  Temp(Src) 98 F (36.7 C) (Oral)  Resp 16  Ht 4\' 9"  (1.448 m)  Wt 142 lb (64.411 kg)  BMI 30.72 kg/m2  SpO2 97% No data found.   Physical Exam  Constitutional: She appears well-developed and well-nourished. No distress.  HENT:  Head: Normocephalic and atraumatic.  Right Ear: Tympanic membrane, external ear and ear canal normal.  Nose: Nose normal.  Mouth/Throat: Oropharynx is clear  and moist. No oropharyngeal exudate.  Left ear canal with blood clot obstructing ear canal and dried blood surrounding  Skin: She is not diaphoretic.  Nursing note and vitals reviewed.   ED Course  Procedures (including critical care time)  Labs Review Labs Reviewed - No data to display  Imaging Review No results found.   Visual Acuity Review  Right Eye Distance:   Left Eye Distance:   Bilateral Distance:    Right Eye Near:   Left Eye Near:    Bilateral Near:         MDM   1. Blood in ear canal, left    1. diagnosis reviewed with patient 2.Recommend patient follow up with ENT for further evaluation and management 3.  Follow-up here prn    Norval Gable, MD 02/16/16 1351

## 2016-03-10 ENCOUNTER — Emergency Department: Payer: Medicare Other

## 2016-03-10 ENCOUNTER — Emergency Department
Admission: EM | Admit: 2016-03-10 | Discharge: 2016-03-10 | Disposition: A | Discharge status: Home / Self Care | Payer: Medicare Other | Attending: Emergency Medicine | Admitting: Emergency Medicine

## 2016-03-10 ENCOUNTER — Encounter: Payer: Self-pay | Admitting: Emergency Medicine

## 2016-03-10 DIAGNOSIS — Y9389 Activity, other specified: Secondary | ICD-10-CM | POA: Insufficient documentation

## 2016-03-10 DIAGNOSIS — Z7984 Long term (current) use of oral hypoglycemic drugs: Secondary | ICD-10-CM | POA: Diagnosis not present

## 2016-03-10 DIAGNOSIS — Y929 Unspecified place or not applicable: Secondary | ICD-10-CM | POA: Insufficient documentation

## 2016-03-10 DIAGNOSIS — Z792 Long term (current) use of antibiotics: Secondary | ICD-10-CM | POA: Diagnosis not present

## 2016-03-10 DIAGNOSIS — I10 Essential (primary) hypertension: Secondary | ICD-10-CM | POA: Insufficient documentation

## 2016-03-10 DIAGNOSIS — Z79899 Other long term (current) drug therapy: Secondary | ICD-10-CM | POA: Diagnosis not present

## 2016-03-10 DIAGNOSIS — S8002XA Contusion of left knee, initial encounter: Secondary | ICD-10-CM

## 2016-03-10 DIAGNOSIS — S8001XA Contusion of right knee, initial encounter: Secondary | ICD-10-CM | POA: Insufficient documentation

## 2016-03-10 DIAGNOSIS — E119 Type 2 diabetes mellitus without complications: Secondary | ICD-10-CM | POA: Insufficient documentation

## 2016-03-10 DIAGNOSIS — W1809XA Striking against other object with subsequent fall, initial encounter: Secondary | ICD-10-CM | POA: Diagnosis not present

## 2016-03-10 DIAGNOSIS — Y999 Unspecified external cause status: Secondary | ICD-10-CM | POA: Insufficient documentation

## 2016-03-10 DIAGNOSIS — S60221A Contusion of right hand, initial encounter: Secondary | ICD-10-CM | POA: Insufficient documentation

## 2016-03-10 DIAGNOSIS — E7800 Pure hypercholesterolemia, unspecified: Secondary | ICD-10-CM | POA: Insufficient documentation

## 2016-03-10 DIAGNOSIS — F329 Major depressive disorder, single episode, unspecified: Secondary | ICD-10-CM | POA: Insufficient documentation

## 2016-03-10 DIAGNOSIS — S1080XA Unspecified superficial injury of other specified part of neck, initial encounter: Secondary | ICD-10-CM | POA: Diagnosis not present

## 2016-03-10 DIAGNOSIS — I4891 Unspecified atrial fibrillation: Secondary | ICD-10-CM | POA: Diagnosis not present

## 2016-03-10 DIAGNOSIS — S1093XA Contusion of unspecified part of neck, initial encounter: Secondary | ICD-10-CM

## 2016-03-10 DIAGNOSIS — S0990XA Unspecified injury of head, initial encounter: Secondary | ICD-10-CM

## 2016-03-10 DIAGNOSIS — S0083XA Contusion of other part of head, initial encounter: Secondary | ICD-10-CM

## 2016-03-10 DIAGNOSIS — S80211A Abrasion, right knee, initial encounter: Secondary | ICD-10-CM

## 2016-03-10 DIAGNOSIS — S0181XA Laceration without foreign body of other part of head, initial encounter: Secondary | ICD-10-CM

## 2016-03-10 DIAGNOSIS — W19XXXA Unspecified fall, initial encounter: Secondary | ICD-10-CM

## 2016-03-10 LAB — CBC WITH DIFFERENTIAL/PLATELET
BASOS ABS: 0 10*3/uL (ref 0–0.1)
Basophils Relative: 0 %
EOS PCT: 1 %
Eosinophils Absolute: 0.1 10*3/uL (ref 0–0.7)
HCT: 42.8 % (ref 35.0–47.0)
Hemoglobin: 14.1 g/dL (ref 12.0–16.0)
LYMPHS PCT: 5 %
Lymphs Abs: 0.8 10*3/uL — ABNORMAL LOW (ref 1.0–3.6)
MCH: 27.4 pg (ref 26.0–34.0)
MCHC: 33 g/dL (ref 32.0–36.0)
MCV: 82.9 fL (ref 80.0–100.0)
Monocytes Absolute: 0.6 10*3/uL (ref 0.2–0.9)
Monocytes Relative: 5 %
NEUTROS ABS: 12.4 10*3/uL — AB (ref 1.4–6.5)
Neutrophils Relative %: 89 %
PLATELETS: 269 10*3/uL (ref 150–440)
RBC: 5.16 MIL/uL (ref 3.80–5.20)
RDW: 14.6 % — ABNORMAL HIGH (ref 11.5–14.5)
WBC: 13.9 10*3/uL — AB (ref 3.6–11.0)

## 2016-03-10 LAB — COMPREHENSIVE METABOLIC PANEL
ALT: 16 U/L (ref 14–54)
ANION GAP: 11 (ref 5–15)
AST: 29 U/L (ref 15–41)
Albumin: 4.3 g/dL (ref 3.5–5.0)
Alkaline Phosphatase: 85 U/L (ref 38–126)
BILIRUBIN TOTAL: 0.4 mg/dL (ref 0.3–1.2)
BUN: 18 mg/dL (ref 6–20)
CO2: 23 mmol/L (ref 22–32)
Calcium: 9.1 mg/dL (ref 8.9–10.3)
Chloride: 105 mmol/L (ref 101–111)
Creatinine, Ser: 0.92 mg/dL (ref 0.44–1.00)
GFR, EST NON AFRICAN AMERICAN: 59 mL/min — AB (ref >60)
Glucose, Bld: 153 mg/dL — ABNORMAL HIGH (ref 65–99)
POTASSIUM: 3.3 mmol/L — AB (ref 3.5–5.1)
Sodium: 139 mmol/L (ref 135–145)
TOTAL PROTEIN: 7.8 g/dL (ref 6.5–8.1)

## 2016-03-10 MED ORDER — TETANUS-DIPHTH-ACELL PERTUSSIS 5-2.5-18.5 LF-MCG/0.5 IM SUSP
0.5000 mL | Freq: Once | INTRAMUSCULAR | Status: AC
Start: 2016-03-10 — End: 2016-03-10
  Administered 2016-03-10: 0.5 mL via INTRAMUSCULAR
  Filled 2016-03-10: qty 0.5

## 2016-03-10 MED ORDER — IOPAMIDOL (ISOVUE-300) INJECTION 61%
75.0000 mL | Freq: Once | INTRAVENOUS | Status: AC | PRN
  Administered 2016-03-10: 75 mL via INTRAVENOUS
  Filled 2016-03-10: qty 75

## 2016-03-10 MED ORDER — ALPRAZOLAM 0.5 MG PO TABS
0.5000 mg | ORAL_TABLET | Freq: Once | ORAL | Status: AC
  Administered 2016-03-10: 0.5 mg via ORAL
  Filled 2016-03-10: qty 1

## 2016-03-10 MED ORDER — LIDOCAINE-EPINEPHRINE (PF) 1 %-1:200000 IJ SOLN
INTRAMUSCULAR | Status: AC
  Filled 2016-03-10: qty 30

## 2016-03-10 MED ORDER — TRAMADOL HCL 50 MG PO TABS: 50.0000 mg | ORAL_TABLET | Freq: Four times a day (QID) | ORAL | Status: DC | PRN

## 2016-03-10 MED ORDER — TRAMADOL HCL 50 MG PO TABS
ORAL_TABLET | ORAL | Status: AC
  Administered 2016-03-10: 50 mg via ORAL
  Filled 2016-03-10: qty 1

## 2016-03-10 MED ORDER — TRAMADOL HCL 50 MG PO TABS
50.0000 mg | ORAL_TABLET | Freq: Once | ORAL | Status: AC
  Administered 2016-03-10: 50 mg via ORAL

## 2016-03-10 MED ORDER — HYDROCODONE-ACETAMINOPHEN 5-325 MG PO TABS
1.0000 | ORAL_TABLET | Freq: Once | ORAL | Status: DC
  Filled 2016-03-10: qty 1

## 2016-03-10 MED ORDER — LIDOCAINE-EPINEPHRINE (PF) 2 %-1:200000 IJ SOLN
10.0000 mL | Freq: Once | INTRAMUSCULAR | Status: DC
  Filled 2016-03-10: qty 10

## 2016-03-10 NOTE — ED Notes (Signed)
Pt. Going home with family will follow up with urgent care in [facility] in one week for suture removal.

## 2016-03-10 NOTE — ED Provider Notes (Signed)
[facility] Emergency Department Provider Note  ____________________________________________  Time seen: Approximately 3:21 PM  I have reviewed the triage vital signs and the nursing notes.   HISTORY  Chief Complaint Facial Laceration    HPI Vanessa Young is a 75 y.o. female who presents to emergency department status post a fall this afternoon. Patient states that she was walking towards her car when her foot caught and uneven section of payment causing her to fall forward. She states that she tried to catch herself but ended up landing on her knees, right hand, striking the left side of her forehead against the corner of the car door. Patient endorses a laceration to the left eyebrow. She endorses a headache, left-sided neck pain, right hand pain, bilateral knee pain. Patient denies any blurred vision or double vision, shortness of breath or chest pain, nausea or vomiting. Patient is using Xarelto.   Past Medical History  Diagnosis Date  . A-fib ([place])   . Hypertension   . Diabetes mellitus without complication ([place])   . GERD (gastroesophageal reflux disease)   . Hypercholesteremia   . Depression     There are no active problems to display for this patient.   Past Surgical History  Procedure Laterality Date  . Cystoscopy      Current Outpatient Rx  Name  Route  Sig  Dispense  Refill  . azithromycin (ZITHROMAX) 500 MG tablet      1 tablet daily for 5 days   5 tablet   0   . diltiazem (CARDIZEM) 120 MG tablet   Oral   Take 120 mg by mouth daily.          Marland Kitchen diltiazem (DILACOR XR) 180 MG 24 hr capsule   Oral   Take 180 mg by mouth every evening.         . escitalopram (LEXAPRO) 5 MG tablet   Oral   Take 5 mg by mouth at bedtime.          . fexofenadine-pseudoephedrine (ALLEGRA-D ALLERGY & CONGESTION) 180-240 MG 24 hr tablet   Oral   Take 1 tablet by mouth daily.   30 tablet   0   . guanFACINE (TENEX) 1 MG tablet   Oral   Take 1  mg by mouth 2 (two) times daily.          Marland Kitchen losartan (COZAAR) 100 MG tablet   Oral   Take 100 mg by mouth daily.         . magnesium oxide (MAG-OX) 400 MG tablet   Oral   Take 400 mg by mouth 2 (two) times daily.          . metFORMIN (GLUMETZA) 500 MG (MOD) 24 hr tablet   Oral   Take 500 mg by mouth daily with breakfast.         . omeprazole (PRILOSEC) 20 MG capsule   Oral   Take 20 mg by mouth daily.         . rivaroxaban (XARELTO) 20 MG TABS tablet   Oral   Take 20 mg by mouth daily with supper.         . rosuvastatin (CRESTOR) 5 MG tablet   Oral   Take 5 mg by mouth daily. Daily on Monday and Thursday         . traMADol (ULTRAM) 50 MG tablet   Oral   Take 1 tablet (50 mg total) by mouth every 6 (six) hours as  needed.   20 tablet   0     Allergies Keflex; Lisinopril; Penicillins; and Statins  Family History  Problem Relation Age of Onset  . CVA Mother   . CVA Father     Social History Social History  Substance Use Topics  . Smoking status: Never Smoker   . Smokeless tobacco: None  . Alcohol Use: No     Review of Systems  Constitutional: No fever/chills Eyes: No visual changes.  Cardiovascular: no chest pain. Respiratory: no cough. No SOB. Gastrointestinal:   No nausea, no vomiting.   Musculoskeletal: Negative for back pain. Positive for right hand pain. Positive for left knee pain. Skin: Negative for rash. Neurological: Positive for headache but denies focal weakness or numbness. 10-point ROS otherwise negative.  ____________________________________________   PHYSICAL EXAM:  VITAL SIGNS: ED Triage Vitals  Enc Vitals Group     BP 03/10/16 1343 172/82 mmHg     Pulse Rate 03/10/16 1343 105     Resp 03/10/16 1343 18     Temp 03/10/16 1343 98.2 F (36.8 C)     Temp Source 03/10/16 1343 Oral     SpO2 03/10/16 1343 97 %     Weight 03/10/16 1343 140 lb (63.504 kg)     Height 03/10/16 1343 4\' 9"  (1.448 m)     Head Cir --       Peak Flow --      Pain Score 03/10/16 1345 5     Pain Loc --      Pain Edu? --      Excl. in [place]? --      Constitutional: Alert and oriented. Well appearing and in no acute distress. Eyes: Conjunctivae are normal. PERRL. EOMI. Head: Positive for laceration through the left eyebrow. This is approximately 3.5 cm in length. Edges are ragged. No visible foreign body. Bleeding is controlled. Ecchymosis is noted to the surrounding area around laceration. I cannot to left eye. Spreading ecchymosis down the left cheek. Patient is tender to palpation over the right frontal region. No crepitus noted. No palatal abnormality. No other tenderness to palpation over the skull. No crepitus noted. ENT:      Ears: No serosanguineous fluid drainage.      Nose: No congestion/rhinnorhea. No serosanguineous fluid drainage.      Mouth/Throat: Mucous membranes are moist.  Neck: No stridor.  No cervical spine tenderness to palpation. Large hematoma noted to the left anterior neck. Patient reports mild tenderness to palpation over this area. Area is not pulsating. No bruits are appreciated bilaterally. Cardiovascular: Normal rate, regular rhythm. Normal S1 and S2.  Good peripheral circulation. Respiratory: Normal respiratory effort without tachypnea or retractions. Lungs CTAB. Gastrointestinal: Soft and nontender. No distention. No rigidity. . Musculoskeletal: No visible deformity to right hand. Hematoma noted to the dorsal aspect over the fourth and fifth metacarpal bones. No palpable abnormality. Area is tender to palpation. Full range of motion of wrist and all digits. Sensation intact 5 digits. Radial pulses appreciated and Refill appreciated affected extremity. No visible deformity to the bilateral knees. Small abrasion noted to the anterior aspect of the right knee. Full range of motion to knee. Minimal tenderness to palpation of the anterior aspect of the right knee. Varus, valgus, Lachman's are negative to the  right knee. No visible deformity to left knee upon inspection. Hematoma noted to left knee. Knee is diffusely tender to palpation over the anterior aspect. No palpable abnormality. Limited range of motion due to pain. Negative  varus, valgus, Lachman's to knee. Dorsalis pedis pulses appreciated bilateral lower extremities. Sensation intact and equal lower extremities. Neurologic:  Normal speech and language. No gross focal neurologic deficits are appreciated. Cranial nerves II through XII grossly intact. Negative pronator and Romberg's. Skin:  Skin is warm, dry and intact. No rash noted. Psychiatric: Mood and affect are normal. Speech and behavior are normal. Patient exhibits appropriate insight and judgement.   ____________________________________________   LABS (all labs ordered are listed, but only abnormal results are displayed)  Labs Reviewed  COMPREHENSIVE METABOLIC PANEL - Abnormal; Notable for the following:    Potassium 3.3 (*)    Glucose, Bld 153 (*)    GFR calc non Af Amer 59 (*)    All other components within normal limits  CBC WITH DIFFERENTIAL/PLATELET - Abnormal; Notable for the following:    WBC 13.9 (*)    RDW 14.6 (*)    Neutro Abs 12.4 (*)    Lymphs Abs 0.8 (*)    All other components within normal limits   ____________________________________________  EKG   ____________________________________________  RADIOLOGY Diamantina Providence Cuthriell, personally viewed and evaluated these images (plain radiographs) as part of my medical decision making, as well as reviewing the written report by the radiologist.  Dg Knee 2 Views Left  03/10/2016  CLINICAL DATA:  Pt states she was walking down the steps with flowers in her arms and fell and her head heat her car door. Huge hematoma on left knee. Pt on blood thinners. EXAM: LEFT KNEE - 1-2 VIEW COMPARISON:  None. FINDINGS: Large anterior soft tissue hematoma. No fracture. Mild medial joint space compartment narrowing. No other  arthropathic change. No joint effusion. IMPRESSION: 1. No fracture or dislocation. 2. Large anterior soft tissue hematoma.  No joint effusion. Electronically Signed   By: Lajean Manes M.D.   On: 03/10/2016 15:52   Ct Head Wo Contrast  03/10/2016  CLINICAL DATA:  Patient fell earlier today.  Anticoagulation. EXAM: CT HEAD WITHOUT CONTRAST CT MAXILLOFACIAL WITHOUT CONTRAST TECHNIQUE: Multidetector CT imaging of the head and maxillofacial structures were performed using the standard protocol without intravenous contrast. Multiplanar CT image reconstructions of the maxillofacial structures were also generated. COMPARISON:  CT neck reported separately. FINDINGS: CT HEAD FINDINGS No evidence for acute infarction, hemorrhage, mass lesion, hydrocephalus, or extra-axial fluid. Generalized atrophy. Chronic microvascular ischemic change. Calvarium intact. Large LEFT frontal scalp hematoma with laceration. No sinus or mastoid air fluid level. Vascular calcification and dolichoectasia. CT MAXILLOFACIAL FINDINGS No facial fracture is evident. There is no sinus or mastoid air fluid level. There is a moderate-sized hematoma just lateral to the LEFT orbit, associated with swelling of the superior eyelid and preseptal periorbital soft tissue swelling. No postseptal hematoma. No visible radiopaque foreign body in the scalp or periorbital soft tissues. Globes are symmetric. Slight swelling over the LEFT cheek. No involvement of the masticator space. Visualized salivary glands unremarkable. No airway compromise. There is no blowout injury. Nasal bones are intact. Nasal septum essentially midline. TMJs are located. No mandibular fracture. Hounsfield artifact obscures portions of the neck. Atherosclerosis. Cervical spondylosis. IMPRESSION: No skull fracture or intracranial hemorrhage. Large LEFT frontal scalp hematoma and laceration. No facial fracture or blowout injury. No hemosinus or orbital hematoma. LEFT preseptal periorbital  hematoma and soft tissue swelling, without visible injury to the globe or postseptal orbital structures. Electronically Signed   By: Staci Righter M.D.   On: 03/10/2016 16:54   Ct Soft Tissue Neck W Contrast  03/10/2016  CLINICAL DATA:  75 year old female status post fall on Coumadin with left anterior neck hematoma. Initial encounter. EXAM: CT NECK WITH CONTRAST TECHNIQUE: Multidetector CT imaging of the neck was performed using the standard protocol following the bolus administration of intravenous contrast. CONTRAST:  75 mL Isovue-300. COMPARISON:  CT head and face without contrast from today reported separately. FINDINGS: Pharynx and larynx: Laryngeal and pharyngeal soft tissue contours are normal. Negative parapharyngeal and retropharyngeal spaces. Salivary glands: Sublingual space, submandibular glands and parotid glands are within normal limits. Superficial to the left submandibular gland there is a a 3 cm area of mild subcutaneous stranding compatible with superficial contusion in this setting (series 6, image 53). Mild thickening of the underlying platysma. Thyroid: Multiple subcentimeter thyroid nodules which do not meet consensus criteria for ultrasound follow-up. Lymph nodes: No cervical lymphadenopathy. There is a mildly enhancing left level 1 B node situated between the left submandibular gland an the superficial soft tissue contusion. Along the anterior right lower neck and clavicle there is a large area of generalized subcutaneous stranding (series 6, image 77) compatible with infiltrative subcutaneous hematoma or contusion. No discrete fluid collection in the neck. Vascular: Major vascular structures in the neck and at the skullbase are patent, although there is calcified plaque at the right ICA origin which appears to be hemodynamically significant. The left vertebral artery is dominant. Limited intracranial: Negative. Visualized orbits: Negative. Mastoids and visualized paranasal sinuses:  Clear. Skeleton: Mandible intact. Visible clavicles intact. Visualized skull base is intact. No atlanto-occipital dissociation. Multilevel degenerative appearing mild spondylolisthesis in the cervical spine including C4-C5, C5-C6, and C7-T1. Associated widespread cervical facet hypertrophy. Bilateral posterior element alignment is within normal limits. No acute cervical spine fracture identified, but the protocol for this exam is not optimal for that evaluation. There are mild superior endplate deformities of T2, T3, and T4 but these appear chronic. Visualized upper ribs appear intact. Upper chest: Right clavicular region superficial soft tissue injury as detailed above. No axillary lymphadenopathy. Small superior mediastinal lymph nodes appear increased in number but not in size. Negative lung apices. IMPRESSION: 1. Superficial soft tissue contusion and/or hematoma at the left submandibular and right clavicular regions. No discrete or drainable collection. 2. Calcified right ICA bulb atherosclerosis appears to be hemodynamically significant. 3. Otherwise negative neck soft tissues. 4. Chronic appearing mild upper thoracic compression fractures. 5. Nonspecific increased number of small superior mediastinal lymph nodes, probably inflammatory in nature. 6. CT Head and Face from today are reported separately. Electronically Signed   By: Genevie Ann M.D.   On: 03/10/2016 16:56   Dg Hand Complete Right  03/10/2016  CLINICAL DATA:  Fall today. Swollen and tenderness around the fifth metacarpal. EXAM: RIGHT HAND - COMPLETE 3+ VIEW COMPARISON:  None. FINDINGS: No fracture. No dislocation. There is a bone cyst at the base of the middle phalanx of the middle finger. There is joint space narrowing with small marginal osteophytes at the second metacarpophalangeal joints. Ulnar styloid is truncated with an adjacent small well corticated bone fragment likely an old fracture. Bones are demineralized. There is soft tissue swelling  over the dorsal ulnar aspect of the hand. IMPRESSION: 1. No fracture or dislocation. Electronically Signed   By: Lajean Manes M.D.   On: 03/10/2016 16:59   Ct Maxillofacial Wo Cm  03/10/2016  CLINICAL DATA:  Patient fell earlier today.  Anticoagulation. EXAM: CT HEAD WITHOUT CONTRAST CT MAXILLOFACIAL WITHOUT CONTRAST TECHNIQUE: Multidetector CT imaging of the head and maxillofacial structures were performed using  the standard protocol without intravenous contrast. Multiplanar CT image reconstructions of the maxillofacial structures were also generated. COMPARISON:  CT neck reported separately. FINDINGS: CT HEAD FINDINGS No evidence for acute infarction, hemorrhage, mass lesion, hydrocephalus, or extra-axial fluid. Generalized atrophy. Chronic microvascular ischemic change. Calvarium intact. Large LEFT frontal scalp hematoma with laceration. No sinus or mastoid air fluid level. Vascular calcification and dolichoectasia. CT MAXILLOFACIAL FINDINGS No facial fracture is evident. There is no sinus or mastoid air fluid level. There is a moderate-sized hematoma just lateral to the LEFT orbit, associated with swelling of the superior eyelid and preseptal periorbital soft tissue swelling. No postseptal hematoma. No visible radiopaque foreign body in the scalp or periorbital soft tissues. Globes are symmetric. Slight swelling over the LEFT cheek. No involvement of the masticator space. Visualized salivary glands unremarkable. No airway compromise. There is no blowout injury. Nasal bones are intact. Nasal septum essentially midline. TMJs are located. No mandibular fracture. Hounsfield artifact obscures portions of the neck. Atherosclerosis. Cervical spondylosis. IMPRESSION: No skull fracture or intracranial hemorrhage. Large LEFT frontal scalp hematoma and laceration. No facial fracture or blowout injury. No hemosinus or orbital hematoma. LEFT preseptal periorbital hematoma and soft tissue swelling, without visible injury  to the globe or postseptal orbital structures. Electronically Signed   By: Staci Righter M.D.   On: 03/10/2016 16:54    ____________________________________________    PROCEDURES  Procedure(s) performed:   LACERATION REPAIR Performed by: Darletta Moll Authorized by: Charline Bills Cuthriell Consent: Verbal consent obtained. Risks and benefits: risks, benefits and alternatives were discussed Consent given by: patient Patient identity confirmed: provided demographic data Prepped and Draped in normal sterile fashion Wound explored  Laceration Location: Left forehead  Laceration Length: 3.5 cm  No Foreign Bodies seen or palpated  Anesthesia: local infiltration  Local anesthetic: lidocaine 1 % with epinephrine  Anesthetic total: 7 ml  Irrigation method: syringe Amount of cleaning: standard  Skin closure: 5-0 Ethilon sutures   Number of sutures: 9   Technique: Simple interrupted   Patient tolerance: Patient tolerated the procedure well with no immediate complications.     Medications  lidocaine-EPINEPHrine (XYLOCAINE W/EPI) 2 %-1:200000 (PF) injection 10 mL (not administered)  lidocaine-EPINEPHrine (XYLOCAINE-EPINEPHrine) 1 %-1:200000 (PF) injection (not administered)  ALPRAZolam (XANAX) tablet 0.5 mg (0.5 mg Oral Given 03/10/16 1526)  traMADol (ULTRAM) tablet 50 mg (50 mg Oral Given 03/10/16 1555)  iopamidol (ISOVUE-300) 61 % injection 75 mL (75 mLs Intravenous Contrast Given 03/10/16 1705)  Tdap (BOOSTRIX) injection 0.5 mL (0.5 mLs Intramuscular Given 03/10/16 1729)     ____________________________________________   INITIAL IMPRESSION / ASSESSMENT AND PLAN / ED COURSE  Pertinent labs & imaging results that were available during my care of the patient were reviewed by me and considered in my medical decision making (see chart for details).   ----------------------------------------- 5:21 PM on 03/10/2016 -----------------------------------------  CT scans  and x-rays returned with reassuring results. No intracranial hemorrhage. No fractures. Incidental finding of significant atherosclerosis to the right ICA. At this time laceration will be closed emergency department.    Patient's diagnosis is consistent withA fall resulting in facial laceration and contusion, neck contusion, hand contusion, knee contusion and abrasion. The patient suffered a mechanical fall but did strike her head and was on blood thinners. As such, multiple imaging studies were undertaken. These resulted without any acute/emergent abnormalities. There was an incidental finding of significant atherosclerosis in the right ICA. Patient's laceration was closed as described above. Patient tolerated procedure well. Exam is  reassuring after this time the patient will be discharged home. She has a follow-up appointment with Dr. Ubaldo Glassing for routine visit for her atrial fibrillation and she is encouraged to discuss atherosclerosis with him at that time. Patient will follow-up with [place] urgent care in one week for suture removal. . Patient will be discharged home with prescriptions for tramadol for pain.  Patient is given ED precautions to return to the ED for any worsening or new symptoms.     ____________________________________________  FINAL CLINICAL IMPRESSION(S) / ED DIAGNOSES  Final diagnoses:  Fall, initial encounter  Head trauma, initial encounter  Facial laceration, initial encounter  Facial contusion, initial encounter  Neck contusion, initial encounter  Hand contusion, right, initial encounter  Knee contusion, left, initial encounter  Knee abrasion, right, initial encounter      NEW MEDICATIONS STARTED DURING THIS VISIT:  New Prescriptions   TRAMADOL (ULTRAM) 50 MG TABLET    Take 1 tablet (50 mg total) by mouth every 6 (six) hours as needed.        This chart was dictated using voice recognition software/Dragon. Despite best efforts to proofread, errors can occur  which can change the meaning. Any change was purely unintentional.    Darletta Moll, PA-C 03/10/16 1908  Daymon Larsen, MD 03/10/16 [phone]

## 2016-03-10 NOTE — ED Notes (Signed)
Patient returned  - IV left in left hand.  Need removal. ED Tech Kenasia Scheller removed 03/10/2016 1918.

## 2016-03-10 NOTE — ED Notes (Signed)
Patient takes xaralto, no labs drawn, pressure dressing to laceration.

## 2016-03-10 NOTE — Discharge Instructions (Signed)
Facial Laceration ° A facial laceration is a cut on the face. These injuries can be painful and cause bleeding. Lacerations usually heal quickly, but they need special care to reduce scarring. °DIAGNOSIS  °Your health care provider will take a medical history, ask for details about how the injury occurred, and examine the wound to determine how deep the cut is. °TREATMENT  °Some facial lacerations may not require closure. Others may not be able to be closed because of an increased risk of infection. The risk of infection and the chance for successful closure will depend on various factors, including the amount of time since the injury occurred. °The wound may be cleaned to help prevent infection. If closure is appropriate, pain medicines may be given if needed. Your health care provider will use stitches (sutures), wound glue (adhesive), or skin adhesive strips to repair the laceration. These tools bring the skin edges together to allow for faster healing and a better cosmetic outcome. If needed, you may also be given a tetanus shot. °HOME CARE INSTRUCTIONS °· Only take over-the-counter or prescription medicines as directed by your health care provider. °· Follow your health care provider's instructions for wound care. These instructions will vary depending on the technique used for closing the wound. °For Sutures: °· Keep the wound clean and dry.   °· If you were given a bandage (dressing), you should change it at least once a day. Also change the dressing if it becomes wet or dirty, or as directed by your health care provider.   °· Wash the wound with soap and water 2 times a day. Rinse the wound off with water to remove all soap. Pat the wound dry with a clean towel.   °· After cleaning, apply a thin layer of the antibiotic ointment recommended by your health care provider. This will help prevent infection and keep the dressing from sticking.   °· You may shower as usual after the first 24 hours. Do not soak the  wound in water until the sutures are removed.   °· Get your sutures removed as directed by your health care provider. With facial lacerations, sutures should usually be taken out after 4-5 days to avoid stitch marks.   °· Wait a few days after your sutures are removed before applying any makeup. °For Skin Adhesive Strips: °· Keep the wound clean and dry.   °· Do not get the skin adhesive strips wet. You may bathe carefully, using caution to keep the wound dry.   °· If the wound gets wet, pat it dry with a clean towel.   °· Skin adhesive strips will fall off on their own. You may trim the strips as the wound heals. Do not remove skin adhesive strips that are still stuck to the wound. They will fall off in time.   °For Wound Adhesive: °· You may briefly wet your wound in the shower or bath. Do not soak or scrub the wound. Do not swim. Avoid periods of heavy sweating until the skin adhesive has fallen off on its own. After showering or bathing, gently pat the wound dry with a clean towel.   °· Do not apply liquid medicine, cream medicine, ointment medicine, or makeup to your wound while the skin adhesive is in place. This may loosen the film before your wound is healed.   °· If a dressing is placed over the wound, be careful not to apply tape directly over the skin adhesive. This may cause the adhesive to be pulled off before the wound is healed.   °· Avoid   prolonged exposure to sunlight or tanning lamps while the skin adhesive is in place.  The skin adhesive will usually remain in place for 5-10 days, then naturally fall off the skin. Do not pick at the adhesive film.  After Healing: Once the wound has healed, cover the wound with sunscreen during the day for 1 full year. This can help minimize scarring. Exposure to ultraviolet light in the first year will darken the scar. It can take 1-2 years for the scar to lose its redness and to heal completely.  SEEK MEDICAL CARE IF:  You have a fever. SEEK IMMEDIATE  MEDICAL CARE IF:  You have redness, pain, or swelling around the wound.   You see ayellowish-white fluid (pus) coming from the wound.    This information is not intended to replace advice given to you by your health care provider. Make sure you discuss any questions you have with your health care provider.   Document Released: 01/10/2005 Document Revised: 12/24/2014 Document Reviewed: 07/16/2013 Elsevier Interactive Patient Education 2016 [place].  Facial or Scalp Contusion A facial or scalp contusion is a deep bruise on the face or head. Injuries to the face and head generally cause a lot of swelling, especially around the eyes. Contusions are the result of an injury that caused bleeding under the skin. The contusion may turn blue, purple, or yellow. Minor injuries will give you a painless contusion, but more severe contusions may stay painful and swollen for a few weeks.  CAUSES  A facial or scalp contusion is caused by a blunt injury or trauma to the face or head area.  SIGNS AND SYMPTOMS   Swelling of the injured area.   Discoloration of the injured area.   Tenderness, soreness, or pain in the injured area.  DIAGNOSIS  The diagnosis can be made by taking a medical history and doing a physical exam. An X-ray exam, CT scan, or MRI may be needed to determine if there are any associated injuries, such as broken bones (fractures). TREATMENT  Often, the best treatment for a facial or scalp contusion is applying cold compresses to the injured area. Over-the-counter medicines may also be recommended for pain control.  HOME CARE INSTRUCTIONS   Only take over-the-counter or prescription medicines as directed by your health care provider.   Apply ice to the injured area.   Put ice in a plastic bag.   Place a towel between your skin and the bag.   Leave the ice on for 20 minutes, 2-3 times a day.  SEEK MEDICAL CARE IF:  You have bite problems.   You have pain with  chewing.   You are concerned about facial defects. SEEK IMMEDIATE MEDICAL CARE IF:  You have severe pain or a headache that is not relieved by medicine.   You have unusual sleepiness, confusion, or personality changes.   You throw up (vomit).   You have a persistent nosebleed.   You have double vision or blurred vision.   You have fluid drainage from your nose or ear.   You have difficulty walking or using your arms or legs.  MAKE SURE YOU:   Understand these instructions.  Will watch your condition.  Will get help right away if you are not doing well or get worse.   This information is not intended to replace advice given to you by your health care provider. Make sure you discuss any questions you have with your health care provider.   Document Released: 01/10/2005  Document Revised: 12/24/2014 Document Reviewed: 07/16/2013 Elsevier Interactive Patient Education 2016 [employer]  Hematoma A hematoma is a collection of blood under the skin, in an organ, in a body space, in a joint space, or in other tissue. The blood can clot to form a lump that you can see and feel. The lump is often firm and may sometimes become sore and tender. Most hematomas get better in a few days to weeks. However, some hematomas may be serious and require medical care. Hematomas can range in size from very small to very large. CAUSES  A hematoma can be caused by a blunt or penetrating injury. It can also be caused by spontaneous leakage from a blood vessel under the skin. Spontaneous leakage from a blood vessel is more likely to occur in older people, especially those taking blood thinners. Sometimes, a hematoma can develop after certain medical procedures. SIGNS AND SYMPTOMS   A firm lump on the body.  Possible pain and tenderness in the area.  Bruising.Blue, dark blue, purple-red, or yellowish skin may appear at the site of the hematoma if the hematoma is close to the surface of the  skin. For hematomas in deeper tissues or body spaces, the signs and symptoms may be subtle. For example, an intra-abdominal hematoma may cause abdominal pain, weakness, fainting, and shortness of breath. An intracranial hematoma may cause a headache or symptoms such as weakness, trouble speaking, or a change in consciousness. DIAGNOSIS  A hematoma can usually be diagnosed based on your medical history and a physical exam. Imaging tests may be needed if your health care provider suspects a hematoma in deeper tissues or body spaces, such as the abdomen, head, or chest. These tests may include ultrasonography or a CT scan.  TREATMENT  Hematomas usually go away on their own over time. Rarely does the blood need to be drained out of the body. Large hematomas or those that may affect vital organs will sometimes need surgical drainage or monitoring. HOME CARE INSTRUCTIONS   Apply ice to the injured area:   Put ice in a plastic bag.   Place a towel between your skin and the bag.   Leave the ice on for 20 minutes, 2-3 times a day for the first 1 to 2 days.   After the first 2 days, switch to using warm compresses on the hematoma.   Elevate the injured area to help decrease pain and swelling. Wrapping the area with an elastic bandage may also be helpful. Compression helps to reduce swelling and promotes shrinking of the hematoma. Make sure the bandage is not wrapped too tight.   If your hematoma is on a lower extremity and is painful, crutches may be helpful for a couple days.   Only take over-the-counter or prescription medicines as directed by your health care provider. SEEK IMMEDIATE MEDICAL CARE IF:   You have increasing pain, or your pain is not controlled with medicine.   You have a fever.   You have worsening swelling or discoloration.   Your skin over the hematoma breaks or starts bleeding.   Your hematoma is in your chest or abdomen and you have weakness, shortness of  breath, or a change in consciousness.  Your hematoma is on your scalp (caused by a fall or injury) and you have a worsening headache or a change in alertness or consciousness. MAKE SURE YOU:   Understand these instructions.  Will watch your condition.  Will get help right away if you are  not doing well or get worse.   This information is not intended to replace advice given to you by your health care provider. Make sure you discuss any questions you have with your health care provider.   Document Released: 07/17/2004 Document Revised: 08/05/2013 Document Reviewed: 05/13/2013 Elsevier Interactive Patient Education 2016 [place] Tissue Injury of the Neck A soft tissue injury of the neck may be either blunt or penetrating. A blunt injury does not break the skin. A penetrating injury breaks the skin, creating an open wound. Blunt injuries may happen in several ways. Most involve some type of direct blow to the neck. This can cause serious injury to the windpipe, voice box, cervical spine, or esophagus. In some cases, the injury to the soft tissue can also result in a break (fracture) of the cervical spine.  Soft tissue injuries of the neck require immediate medical care. Sometimes, you may not notice the signs of injury right away. You may feel fine at first, but the swelling may eventually close off your airway. This could result in a significant or life-threatening injury. This is rare, but it is important to keep in mind with any injury to the neck.  CAUSES  Causes of blunt injury may include:  "Clothesline" injuries. This happens when someone is moving at high speed and runs into a clothesline, outstretched arm, or similar object. This results in a direct injury to the front of the neck. If the airway is blocked, it can cause suffocation due to lack of oxygen (asphyxiation) or even instant death.  High-energy trauma. This includes injuries from motor vehicle crashes, falling from  a great height, or heavy objects falling onto the neck.  Sports-related injuries. Injury to the windpipe and voice box can result from being struck by another player or being struck by an object, such as a baseball, hockey stick, or an outstretched arm.  Strangulation. This type of injury may cause skin trauma, hoarseness of voice, or broken cartilage in the voice box or windpipe. It may also cause a serious airway problem. SYMPTOMS   Bruising.  Pain and tenderness in the neck.  Swelling of the neck and face.  Hoarseness of voice.  Pain or difficulty with swallowing.  Drooling or inability to swallow.  Trouble breathing. This may become worse when lying flat.  Coughing up blood.  High-pitched, harsh, vibratory noise due to partial obstruction of the windpipe (stridor).  Swelling of the upper arms.  Windpipe that appears to be pushed off to one side.  Air in the tissues under the skin of the neck or chest (subcutaneous emphysema). This usually indicates a problem with the normal airway and is a medical emergency. DIAGNOSIS   If possible, your caregiver may ask about the details of how the injury occurred. A detailed exam can help to identify specific areas of the neck that are injured.  Your caregiver may ask for tests to rule out injury of the voice box, airway, or esophagus. This may include X-rays, ultrasounds, CT scans, or MRI scans, depending on the severity of your injury. TREATMENT  If you have an injury to your windpipe or voice box, immediate medical care is required. In almost all cases, hospitalization is necessary. For injuries that do not appear to require surgery, it is helpful to have medical observation for 24 hours. You may be asked to do one or more of the following:  Rest your voice.  Bed rest.  Limit your diet, depending on the  extent of the injury. Follow your caregiver's dietary guidelines. Often, only fluids and soft foods are recommended.  Keep your  head raised.  Breathe humidified air.  Take medicines to control infection, reduce swelling, and reduce normal stomach acid. You may also need pain medicine, depending on your injury. For injuries that appear to require surgery, you will need to stay in the hospital. The exact type of procedure needed will depend on your exact injury or injuries.  HOME CARE INSTRUCTIONS   If the skin was broken, keep the wound area clean and dry. Wear your bandage (dressing) and care for your wound as instructed.  Follow your caregiver's advice about your diet.  Follow your caregiver's advice about use of your voice.  Take medicines as directed.  Keep your head and neck at least partially raised (elevated) while recovering. This should also be done while sleeping. SEEK MEDICAL CARE IF:   Your voice becomes weaker.  Your swelling or bruising is not improving as expected. Typically, this takes several days to improve.  You feel that you are having problems with medicines prescribed.  You have drainage from the injury site. This may be a sign that your wound is not healing properly or is infected.  You develop increasing pain or difficulty while swallowing.  You develop an oral temperature of 102 F (38.9 C) or higher. SEEK IMMEDIATE MEDICAL CARE IF:   You cough up blood.  You develop sudden trouble breathing.  You cannot tolerate your oral medicines, or you are unable to swallow.  You develop drooling.  You have new or worsening vomiting.  You develop sudden, new swelling of the neck or face.  You have an oral temperature above 102 F (38.9 C), not controlled by medicine. MAKE SURE YOU:  Understand these instructions.  Will watch your condition.  Will get help right away if you are not doing well or get worse.   This information is not intended to replace advice given to you by your health care provider. Make sure you discuss any questions you have with your health care  provider.   Document Released: 03/11/2008 Document Revised: 02/25/2012 Document Reviewed: 06/22/2015 Elsevier Interactive Patient Education 2016 [employer]  Periosteal Hematoma Periosteal hematoma (bone bruise) is a localized, tender, raised area close to the bone. It can occur from a small hidden fracture of the bone, following surgery, or from other trauma to the area. It typically occurs in bones located close to the surface of the skin, such as the shin, knee, and heel bone. Although it may take 2 or more weeks to completely heal, bone bruises typically are not associated with permanent or serious damage to the bone. If you are taking blood thinners, you may be at greater risk for such injuries.  CAUSES  A bone bruise is usually caused by high-impact trauma to the bone, but it can be caused by sports injuries or twisting injuries. SIGNS AND SYMPTOMS   Severe pain around the injured area that typically lasts longer than a normal bruise.  Difficulty using the bruised area.  Tender, raised area close to the bone.  Discoloration or swelling of the bruised area. DIAGNOSIS  You may need an MRI of the injured area to confirm a bone bruise if your health care provider feels it is necessary. A regular X-ray will not detect a bone bruise, but it will detect a broken bone (fracture). An X-ray may be taken to rule out any fractures. TREATMENT  Often, the best  treatment for a bone bruise is resting, icing, and applying cold compresses to the injured area. Over-the-counter medicines may also be recommended for pain control. HOME CARE INSTRUCTIONS  Some things you can do to improve the condition are:   Rest and elevate the area of injury as long as it is very tender or swollen.  Apply ice to the injured area:  Put ice in a plastic bag.  Place a towel between your skin and the bag.  Leave the ice on for 20 minutes, 2-3 times a day.  Use an elastic wrap to reduce swelling and protect the  injured area. Make sure it is not applied too tightly. If the area around the wrap becomes cold or blue, the wrap is too tight. Wrap it more loosely.  For activity:  Follow your health care provider's instructions about whether walking with crutches is required. This will depend on how serious your condition is.  Start weight bearing gradually on the bruised part.  Continue to use crutches or a cane until you can stand without causing pain, or as instructed.  If a plaster splint was applied:  Wear the splint until you are seen for a follow-up exam.  Rest it on nothing harder than a pillow the first 24 hours.  Do not put weight on it.  Do not get it wet. You may take it off to take a shower or bath.  You may have been given an elastic bandage to use with or without the plaster splint. The splint is too tight if you have numbness or tingling, or if the skin around the bandage becomes cold and blue. Adjust the bandage to make it comfortable.  If an air splint was applied:  You may alter the amount of air in the splint as needed for comfort.  You may take it off at night and to take a shower or bath.  If the injury was in either leg, wiggle your toes in the splint several times per day if you are able.  Only take over-the-counter or prescription medicines for pain, discomfort, or fever as directed by your health care provider.  Keep all follow-up visits with your health care provider. This includes any orthopedic referrals, physical therapy, and rehabilitation. Any delay in getting necessary care could result in a delay or failure of the bones to heal. SEEK MEDICAL CARE IF:   You have an increase in bruising, swelling, tenderness, heat, or pain over your injury.  You notice coldness of your toes that does not improve after removing a splint or bandage.  Your pain is not lessened after you take medicine.  You have increased difficulty bearing weight on the injured leg, if the  injury is in either leg. SEEK IMMEDIATE MEDICAL CARE IF:   You have severe pain near the injured area or severe pain with stretching.  You have increased swelling that resulted in a tense, hard area or a loss of sensation in the area of the injury.  You have pale, cool skin below the area of the injury (in an extremity) that does not go away after removing a splint or bandage. MAKE SURE YOU:   Understand these instructions.  Will watch your condition.  Will get help right away if you are not doing well or get worse.   This information is not intended to replace advice given to you by your health care provider. Make sure you discuss any questions you have with your health care provider.  Document Released: 01/10/2005 Document Revised: 09/23/2013 Document Reviewed: 05/22/2013 Elsevier Interactive Patient Education 2016 [place], [place], or Adhesive Wound Closure Health care providers use stitches (sutures), staples, and certain glue (skin adhesives) to hold skin together while it heals (wound closure). You may need this treatment after you have surgery or if you cut your skin accidentally. These methods help your skin to heal more quickly and make it less likely that you will have a scar. A wound may take several months to heal completely. The type of wound you have determines when your wound gets closed. In most cases, the wound is closed as soon as possible (primary skin closure). Sometimes, closure is delayed so the wound can be cleaned and allowed to heal naturally. This reduces the chance of infection. Delayed closure may be needed if your wound:  Is caused by a bite.  Happened more than 6 hours ago.  Involves loss of skin or the tissues under the skin.  Has dirt or debris in it that cannot be removed.  Is infected. WHAT ARE THE DIFFERENT KINDS OF WOUND CLOSURES? There are many options for wound closure. The one that your health care provider uses depends on  how deep and how large your wound is. Adhesive Glue To use this type of glue to close a wound, your health care provider holds the edges of the wound together and paints the glue on the surface of your skin. You may need more than one layer of glue. Then the wound may be covered with a light bandage (dressing). This type of skin closure may be used for small wounds that are not deep (superficial). Using glue for wound closure is less painful than other methods. It does not require a medicine that numbs the area (local anesthetic). This method also leaves nothing to be removed. Adhesive glue is often used for children and on facial wounds. Adhesive glue cannot be used for wounds that are deep, uneven, or bleeding. It is not used inside of a wound.  Adhesive Strips These strips are made of sticky (adhesive), porous paper. They are applied across your skin edges like a regular adhesive bandage. You leave them on until they fall off. Adhesive strips may be used to close very superficial wounds. They may also be used along with sutures to improve the closure of your skin edges.  Sutures Sutures are the oldest method of wound closure. Sutures can be made from natural substances, such as silk, or from synthetic materials, such as nylon and steel. They can be made from a material that your body can break down as your wound heals (absorbable), or they can be made from a material that needs to be removed from your skin (nonabsorbable). They come in many different strengths and sizes. Your health care provider attaches the sutures to a steel needle on one end. Sutures can be passed through your skin, or through the tissues beneath your skin. Then they are tied and cut. Your skin edges may be closed in one continuous stitch or in separate stitches. Sutures are strong and can be used for all kinds of wounds. Absorbable sutures may be used to close tissues under the skin. The disadvantage of sutures is that they may  cause skin reactions that lead to infection. Nonabsorbable sutures need to be removed. Staples When surgical staples are used to close a wound, the edges of your skin on both sides of the wound are brought close together. A staple is placed  across the wound, and an instrument secures the edges together. Staples are often used to close surgical cuts (incisions). Staples are faster to use than sutures, and they cause less skin reaction. Staples need to be removed using a tool that bends the staples away from your skin. HOW DO I CARE FOR MY WOUND CLOSURE?  Take medicines only as directed by your health care provider.  If you were prescribed an antibiotic medicine for your wound, finish it all even if you start to feel better.  Use ointments or creams only as directed by your health care provider.  Wash your hands with soap and water before and after touching your wound.  Do not soak your wound in water. Do not take baths, swim, or use a hot tub until your health care provider approves.  Ask your health care provider when you can start showering. Cover your wound if directed by your health care provider.  Do not take out your own sutures or staples.  Do not pick at your wound. Picking can cause an infection.  Keep all follow-up visits as directed by your health care provider. This is important. HOW LONG WILL I HAVE MY WOUND CLOSURE?  Leave adhesive glue on your skin until the glue peels away.  Leave adhesive strips on your skin until the strips fall off.  Absorbable sutures will dissolve within several days.  Nonabsorbable sutures and staples must be removed. The location of the wound will determine how long they stay in. This can range from several days to a couple of weeks. WHEN SHOULD I SEEK HELP FOR MY WOUND CLOSURE? Contact your health care provider if:  You have a fever.  You have chills.  You have drainage, redness, swelling, or pain at your wound.  There is a bad smell  coming from your wound.  The skin edges of your wound start to separate after your sutures have been removed.  Your wound becomes thick, raised, and darker in color after your sutures come out (scarring).   This information is not intended to replace advice given to you by your health care provider. Make sure you discuss any questions you have with your health care provider.   Document Released: 08/28/2001 Document Revised: 12/24/2014 Document Reviewed: 05/12/2014 Elsevier Interactive Patient Education [employer].

## 2016-03-10 NOTE — ED Notes (Signed)
Tripped and fell into open car door just prior to arrival. No LOC. Mechanical fall.

## 2016-03-23 DIAGNOSIS — C4442 Squamous cell carcinoma of skin of scalp and neck: Secondary | ICD-10-CM | POA: Insufficient documentation

## 2017-03-13 ENCOUNTER — Ambulatory Visit
Admission: EM | Admit: 2017-03-13 | Discharge: 2017-03-13 | Disposition: A | Discharge status: Home / Self Care | Payer: Medicare Other | Attending: Internal Medicine | Admitting: Internal Medicine

## 2017-03-13 DIAGNOSIS — R69 Illness, unspecified: Secondary | ICD-10-CM

## 2017-03-13 DIAGNOSIS — J111 Influenza due to unidentified influenza virus with other respiratory manifestations: Secondary | ICD-10-CM

## 2017-03-13 MED ORDER — BENZONATATE 100 MG PO CAPS: 100.0000 mg | ORAL_CAPSULE | Freq: Three times a day (TID) | ORAL | 0 refills | Status: DC | PRN

## 2017-03-13 MED ORDER — OSELTAMIVIR PHOSPHATE 75 MG PO CAPS
75.0000 mg | ORAL_CAPSULE | Freq: Two times a day (BID) | ORAL | 0 refills | Status: DC
Start: 2017-03-13 — End: 2018-11-10

## 2017-03-13 NOTE — ED Provider Notes (Signed)
[facility] ____________________________________________  Time seen: Approximately 8:52 AM  I have reviewed the triage vital signs and the nursing notes.   HISTORY  Chief Complaint Fever   HPI Vanessa Young is a 76 y.o. female presenting for evaluation of onset of runny nose, nasal congestion, cough, chills, body aches and fever that started yesterday morning. Patient reports that prior to yesterday morning she felt well. Reports her daughter is currently visiting in town and has similar symptoms. Reports fever maximum 101 orally and that was this morning. Reports to take Tylenol just prior to arrival. Reports has continued to eat and drink well. States no nasal congestion and cough or any other symptoms prior to yesterday morning. Denies any recent changes in her chronic medical history. Denies any pain at this time. States no accompanying sore throat.  Denies chest pain, shortness of breath, abdominal pain, dysuria, extremity pain, extremity swelling or rash. Denies recent sickness. Denies recent antibiotic use. Patient reports that she has had evaluation for questionable cancer cells to kidneys, denies any renal insufficiency.  [facility]: PCP    Past Medical History:  Diagnosis Date  . A-fib (HCC)-intermittent   . Depression   . Diabetes mellitus without complication ([place])   . GERD (gastroesophageal reflux disease)   . Hypercholesteremia   . Hypertension     There are no active problems to display for this patient.   Past Surgical History:  Procedure Laterality Date  . CYSTOSCOPY       No current facility-administered medications for this encounter.   Current Outpatient Prescriptions:  .  diltiazem (CARDIZEM) 120 MG tablet, Take 120 mg by mouth daily. , Disp: , Rfl:  .  diltiazem (DILACOR XR) 180 MG 24 hr capsule, Take 180 mg by mouth every evening., Disp: , Rfl:  .  escitalopram (LEXAPRO) 5 MG tablet, Take 5 mg by mouth at bedtime. ,  Disp: , Rfl:  .  guanFACINE (TENEX) 1 MG tablet, Take 1 mg by mouth 2 (two) times daily. , Disp: , Rfl:  .  losartan (COZAAR) 100 MG tablet, Take 100 mg by mouth daily., Disp: , Rfl:  .  magnesium oxide (MAG-OX) 400 MG tablet, Take 400 mg by mouth 2 (two) times daily. , Disp: , Rfl:  .  metFORMIN (GLUMETZA) 500 MG (MOD) 24 hr tablet, Take 500 mg by mouth daily with breakfast., Disp: , Rfl:  .  omeprazole (PRILOSEC) 20 MG capsule, Take 20 mg by mouth daily., Disp: , Rfl:  .  rivaroxaban (XARELTO) 20 MG TABS tablet, Take 20 mg by mouth daily with supper., Disp: , Rfl:  .  rosuvastatin (CRESTOR) 5 MG tablet, Take 5 mg by mouth daily. Daily on Monday and Thursday, Disp: , Rfl:  .  azithromycin (ZITHROMAX) 500 MG tablet, 1 tablet daily for 5 days, Disp: 5 tablet, Rfl: 0 .  benzonatate (TESSALON PERLES) 100 MG capsule, Take 1 capsule (100 mg total) by mouth 3 (three) times daily as needed., Disp: 15 capsule, Rfl: 0 .  fexofenadine-pseudoephedrine (ALLEGRA-D ALLERGY & CONGESTION) 180-240 MG 24 hr tablet, Take 1 tablet by mouth daily., Disp: 30 tablet, Rfl: 0 .  oseltamivir (TAMIFLU) 75 MG capsule, Take 1 capsule (75 mg total) by mouth every 12 (twelve) hours., Disp: 10 capsule, Rfl: 0 .  traMADol (ULTRAM) 50 MG tablet, Take 1 tablet (50 mg total) by mouth every 6 (six) hours as needed., Disp: 20 tablet, Rfl: 0  Allergies Keflex [cephalexin]; Lisinopril; Penicillins; and Statins  Family History  Problem Relation Age of Onset  . CVA Mother   . CVA Father     Social History Social History  Substance Use Topics  . Smoking status: Never Smoker  . Smokeless tobacco: Never Used  . Alcohol use No    Review of Systems Constitutional: As above.  Eyes: No visual changes. ENT: No sore throat. Cardiovascular: Denies chest pain. Respiratory: Denies shortness of breath. Gastrointestinal: No abdominal pain.  No nausea, no vomiting.  No diarrhea.  No constipation. Genitourinary: Negative for  dysuria. Musculoskeletal: Negative for back pain. Skin: Negative for rash. Neurological: Negative for focal weakness or numbness.  10-point ROS otherwise negative.  ____________________________________________   PHYSICAL EXAM:  VITAL SIGNS: ED Triage Vitals  Enc Vitals Group     BP 03/13/17 0835 138/63     Pulse Rate 03/13/17 0835 97     Resp 03/13/17 0835 18     Temp 03/13/17 0835 100.2 F (37.9 C)     Temp Source 03/13/17 0835 Oral     SpO2 03/13/17 0835 94 %     Weight 03/13/17 0832 145 lb (65.8 kg)     Height 03/13/17 0832 4\' 9"  (1.448 m)     Head Circumference --      Peak Flow --      Pain Score 03/13/17 0832 5     Pain Loc --      Pain Edu? --      Excl. in [place]? --     Constitutional: Alert and oriented. Well appearing and in no acute distress. Eyes: Conjunctivae are normal. PERRL. EOMI. Head: Atraumatic. No sinus tenderness to palpation. No swelling. No erythema.  Ears: no erythema, normal TMs bilaterally.   Nose:Nasal congestion with clear rhinorrhea  Mouth/Throat: Mucous membranes are moist. No pharyngeal erythema. No tonsillar swelling or exudate.  Neck: No stridor.  No cervical spine tenderness to palpation. Hematological/Lymphatic/Immunilogical: No cervical lymphadenopathy. Cardiovascular: Normal rate, regular rhythm. Grossly normal heart sounds.  Good peripheral circulation. Respiratory: Normal respiratory effort.  No retractions. No wheezes, rales or rhonchi. Good air movement. Occasional dry cough noted in room. Speaking in complete sentences. Gastrointestinal: Soft and nontender. No CVA tenderness. Musculoskeletal: Ambulatory with steady gait. No cervical, thoracic or lumbar tenderness to palpation. Bilateral lower extremities and nontender and no edema noted. Neurologic:  Normal speech and language. No gait instability. Skin:  Skin appears warm, dry and intact. No rash noted. Psychiatric: Mood and affect are normal. Speech and behavior are  normal.  ___________________________________________   LABS (all labs ordered are listed, but only abnormal results are displayed)  Labs Reviewed - No data to display  PROCEDURES Procedures    INITIAL IMPRESSION / ASSESSMENT AND PLAN / ED COURSE  Pertinent labs & imaging results that were available during my care of the patient were reviewed by me and considered in my medical decision making (see chart for details).  Well-appearing patient. No acute distress. Lungs clear throughout. Reports quick onset of symptoms yesterday. Discussed in detail with patient suspect influenza-like illness. Discussed evaluation and treatment options with patient. Will treat patient with oral Tamiflu and when necessary Tessalon Perles. Encouraged rest, fluids and supportive care. Discussed very strict follow-up and return parameters. Discussed indication, risks and benefits of medications with patient.  Discussed follow up with Primary care physician this week. Discussed follow up and return parameters including no resolution or any worsening concerns. Patient verbalized understanding and agreed to plan.   ____________________________________________   FINAL CLINICAL IMPRESSION(S) /  ED DIAGNOSES  Final diagnoses:  Influenza-like illness     New Prescriptions   BENZONATATE (TESSALON PERLES) 100 MG CAPSULE    Take 1 capsule (100 mg total) by mouth 3 (three) times daily as needed.   OSELTAMIVIR (TAMIFLU) 75 MG CAPSULE    Take 1 capsule (75 mg total) by mouth every 12 (twelve) hours.    Note: This dictation was prepared with Dragon dictation along with smaller phrase technology. Any transcriptional errors that result from this process are unintentional.         Marylene Land, NP 03/13/17 0900

## 2017-03-13 NOTE — ED Triage Notes (Signed)
Patient complains of fever, cough, body aches that started yesterday. Patient states that cough is dry, reports that temp was 101 this morning before taking tylenol.

## 2017-03-13 NOTE — Discharge Instructions (Signed)
Take medication as prescribed. Rest. Drink plenty of fluids.  ° °Follow up with your primary care physician this week as needed. Return to Urgent care for new or worsening concerns.  ° °

## 2017-05-28 ENCOUNTER — Ambulatory Visit: Payer: Medicare Other | Admitting: Dietician

## 2017-05-30 ENCOUNTER — Encounter: Payer: Medicare Other | Attending: Unknown Physician Specialty | Admitting: Dietician

## 2017-05-30 ENCOUNTER — Encounter: Payer: Self-pay | Admitting: Dietician

## 2017-05-30 VITALS — BP 124/80 | Ht <= 58 in | Wt 140.0 lb

## 2017-05-30 DIAGNOSIS — E663 Overweight: Secondary | ICD-10-CM

## 2017-05-30 DIAGNOSIS — E119 Type 2 diabetes mellitus without complications: Secondary | ICD-10-CM | POA: Insufficient documentation

## 2017-05-30 DIAGNOSIS — Z713 Dietary counseling and surveillance: Secondary | ICD-10-CM | POA: Insufficient documentation

## 2017-05-30 NOTE — Progress Notes (Signed)
Diabetes Self-Management Education  Visit Type: First/Initial  Diabetes refresher program, initial visit  Appt. Start Time: 1500 Appt. End Time: 1600  05/31/2017  Ms. Lionel December, identified by name and date of birth, is a 76 y.o. female with a diagnosis of Diabetes: Type 2.   ASSESSMENT  Blood pressure 124/80, height 4\' 9"  (1.448 m), weight 140 lb (63.5 kg). Body mass index is 30.3 kg/m.      Diabetes Self-Management Education - 05/30/17 1514      Visit Information   Visit Type First/Initial     Initial Visit   Diabetes Type Type 2   Are you taking your medications as prescribed? Yes     Psychosocial Assessment   Patient Belief/Attitude about Diabetes Motivated to manage diabetes   Self-care barriers None   Patient Concerns Nutrition/Meal planning   Special Needs None   Preferred Learning Style Auditory   Learning Readiness Not Ready   How often do you need to have someone help you when you read instructions, pamphlets, or other written materials from your doctor or pharmacy? 1 - Never     Pre-Education Assessment   Patient understands the diabetes disease and treatment process. Demonstrates understanding / competency   Patient understands incorporating nutritional management into lifestyle. Needs Review   Patient undertands incorporating physical activity into lifestyle. Needs Review   Patient understands using medications safely. Demonstrates understanding / competency   Patient understands monitoring blood glucose, interpreting and using results Needs Review   Patient understands prevention, detection, and treatment of acute complications. Needs Review   Patient understands prevention, detection, and treatment of chronic complications. Demonstrates understanding / competency   Patient understands how to develop strategies to address psychosocial issues. Needs Review   Patient understands how to develop strategies to promote health/change behavior. Demonstrates  understanding / competency     Complications   Last HgB A1C per patient/outside source 7.5 %  [place] 07/2016   How often do you check your blood sugar? 1-2 times/day   Fasting Blood glucose range (mg/dL) 130-179   Postprandial Blood glucose range (mg/dL) 130-179     Dietary Intake   Breakfast Dave's Bread 2 slices with 1tsp avocado and 1 boiled egg; occasional Egg McMuffin; bacon or Canadian bacon and egg; Special K with berries. 2c. coffee. Loves tomato juice   Lunch If out, chick fila sandwich with fries; at home ham and [place] or BLT on [place] + fruit (grapes, peaches, banana), diet Coke   Snack (afternoon) occasionally banana and peanut butter; thin pretzels   Dinner often takeout from cafeteria: grilled chicken with onions or conutry steak with rice; at home: pork chop or chicken or shrimp grilled; stuffed peppers; Likes tuna; baked potato 1-2 times a week; rice; veg. such as brussels sprouts, asparagus   Snack (evening) pretzels, multigrain chips with salsa; triscuits with some type of topping i.e. cheese; sugar free jello with whipped topping   Beverage(s) water, coffee, diet drinks, uses Fairlife milk (lactose free)     Exercise   Exercise Type ADL's  generally sedentary     Patient Education   Previous Diabetes Education Yes (please comment)  [place] approximately 2014   Nutrition management  Role of diet in the treatment of diabetes and the relationship between the three main macronutrients and blood glucose level;Food label reading, portion sizes and measuring food.;Reviewed blood glucose goals for pre and post meals and how to evaluate the patients' food intake on their blood glucose level.;Meal timing in regards to  the patients' current diabetes medication.;Meal options for control of blood glucose level and chronic complications.;Other (comment)  basic meal planning for gradual weight loss   Physical activity and exercise  Role of exercise on diabetes management, blood pressure  control and cardiac health.;Helped patient identify appropriate exercises in relation to his/her diabetes, diabetes complications and other health issue.   Monitoring Purpose and frequency of SMBG.   Personal strategies to promote health Lifestyle issues that need to be addressed for better diabetes care     Outcomes   Expected Outcomes Demonstrated interest in learning. Expect positive outcomes   Future DMSE PRN   Program Status Not Completed  patient will complete later if needed.      Individualized Plan for Diabetes Self-Management Training:   Learning Objective:  Patient will have a greater understanding of diabetes self-management. Patient education plan is to attend individual and/or group sessions per assessed needs and concerns.  Ms. Bulls requested review of meal planning, as she feels she has gradually drifted away from healthy eating habits. She feels confident that she can now make diet improvements. She has positive support from her daughter.    Plan:   Patient Instructions   Pre-measure portions of snack foods; keep to 15, no more than 20grams carbohydrate for your evening snack.   Control portions of starchy foods at meals to fist-size (1 cup) or less. If closer to 1/2-cup portions, you can add a fruit to the meal.   Use menus to plan easy and balanced meals.   Begin some regular exercise; allow yourself to start with a short time/ distance and gradually increase as you develop more energy.    Expected Outcomes:  Demonstrated interest in learning. Expect positive outcomes  Education material provided: General Guidelines for Meal Planning with Diabetes; Carb Counting and Meal Planning [place]); Plate Planner with food lists; Quick and Healthy Meal Ideas  If problems or questions, patient to contact team via:  Phone and Email  Future DSME appointment: PRN

## 2017-05-30 NOTE — Patient Instructions (Signed)
   Pre-measure portions of snack foods; keep to 15, no more than 20grams carbohydrate for your evening snack.   Control portions of starchy foods at meals to fist-size (1 cup) or less. If closer to 1/2-cup portions, you can add a fruit to the meal.   Use menus to plan easy and balanced meals.   Begin some regular exercise; allow yourself to start with a short time/ distance and gradually increase as you develop more energy.

## 2017-09-15 ENCOUNTER — Ambulatory Visit
Admission: EM | Admit: 2017-09-15 | Discharge: 2017-09-15 | Discharge status: Against Medical Advice | Payer: Medicare Other | Attending: Family Medicine | Admitting: Family Medicine

## 2017-09-15 ENCOUNTER — Ambulatory Visit
Admission: EM | Admit: 2017-09-15 | Discharge: 2017-09-15 | Disposition: A | Discharge status: Home / Self Care | Payer: Medicare Other | Attending: Family Medicine | Admitting: Family Medicine

## 2017-09-15 DIAGNOSIS — R21 Rash and other nonspecific skin eruption: Secondary | ICD-10-CM

## 2017-09-15 DIAGNOSIS — B029 Zoster without complications: Secondary | ICD-10-CM | POA: Diagnosis not present

## 2017-09-15 MED ORDER — VALACYCLOVIR HCL 1 G PO TABS: 1000.0000 mg | ORAL_TABLET | Freq: Two times a day (BID) | ORAL | 0 refills | Status: DC

## 2017-09-15 NOTE — ED Provider Notes (Signed)
[facility]    CSN: [id number] Arrival date & time: 09/15/17  1326     History   Chief Complaint Chief Complaint  Patient presents with  . Rash    HPI Vanessa Young is a 76 y.o. female.   Patient is a 76 year old white female who's had pain in her left leg for about 3 days. She reports burning and irritation the left leg got worse yesterday and today she knows to rash. She's not had any trouble with her back yet. The rash wraps around her left thigh. She did get shingles vaccination but that was about for 5 years ago. History of A. fib depression diabetes GERD hyperlipidemia and hypertension. She does not smoke. She has cystoscopy before in the past. Allergic to Dyazide Keflex lisinopril penicillins and statins.   The history is provided by the patient.  Rash  Location:  Leg Leg rash location:  L upper leg Quality: blistering   Severity:  Moderate Onset quality:  Sudden Duration:  3 days Timing:  Constant Progression:  Worsening Chronicity:  New Relieved by:  Nothing Worsened by:  Nothing   Past Medical History:  Diagnosis Date  . A-fib ([place])   . Depression   . Diabetes mellitus without complication ([place])   . GERD (gastroesophageal reflux disease)   . Hypercholesteremia   . Hypertension     Patient Active Problem List   Diagnosis Date Noted  . SCC (squamous cell carcinoma), scalp/neck 03/23/2016  . 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 Atrial fibrillation ([place]) 01/20/2014  . 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 Osteopenia 01/19/2013  . OA (osteoarthritis) of knee 11/23/2011  . Type 2 diabetes mellitus with microalbuminuria, without long-term current use of insulin ([place]) 09/28/2011  . Type 2 diabetes mellitus with diabetic polyneuropathy, without long-term current use of insulin ([place]) 09/28/2011  . GERD (gastroesophageal reflux disease) 09/28/2011  . Benign essential hypertension 06/21/2011  . Hypercholesterolemia 06/21/2011  . Generalized  anxiety disorder 06/21/2011    Past Surgical History:  Procedure Laterality Date  . CYSTOSCOPY      OB History    No data available       Home Medications    Prior to Admission medications   Medication Sig Start Date End Date Taking? Authorizing Provider  azithromycin (ZITHROMAX) 500 MG tablet 1 tablet daily for 5 days Patient not taking: Reported on 05/30/2017 01/05/16   Frederich Cha, MD  benzonatate (TESSALON PERLES) 100 MG capsule Take 1 capsule (100 mg total) by mouth 3 (three) times daily as needed. Patient not taking: Reported on 05/30/2017 03/13/17   Marylene Land, NP  diltiazem (CARDIZEM) 120 MG tablet Take 120 mg by mouth daily.     [provider]  diltiazem (DILACOR XR) 180 MG 24 hr capsule Take 180 mg by mouth every evening.    [provider]  escitalopram (LEXAPRO) 5 MG tablet Take 5 mg by mouth at bedtime.     [provider]  fexofenadine-pseudoephedrine (ALLEGRA-D ALLERGY & CONGESTION) 180-240 MG 24 hr tablet Take 1 tablet by mouth daily. Patient not taking: Reported on 05/30/2017 01/05/16   Frederich Cha, MD  guanFACINE (TENEX) 1 MG tablet Take 1 mg by mouth 2 (two) times daily.     [provider]  losartan (COZAAR) 100 MG tablet Take 100 mg by mouth daily.    [provider]  magnesium oxide (MAG-OX) 400 MG tablet Take 400 mg by mouth 2 (two) times daily.     [provider]  metFORMIN (GLUMETZA) 500 MG (MOD) 24 hr tablet Take 500 mg by mouth daily with breakfast.    [provider]  omeprazole (PRILOSEC) 20 MG capsule Take 20 mg by mouth daily.    [provider]  oseltamivir (TAMIFLU) 75 MG capsule Take 1 capsule (75 mg total) by mouth every 12 (twelve) hours. Patient not taking: Reported on 05/30/2017 03/13/17   Marylene Land, NP  rivaroxaban (XARELTO) 20 MG TABS tablet Take 20 mg by mouth daily with supper.    [provider]  rosuvastatin (CRESTOR) 5 MG tablet Take 5 mg by mouth  daily. Daily on Monday and Thursday    [provider]  traMADol (ULTRAM) 50 MG tablet Take 1 tablet (50 mg total) by mouth every 6 (six) hours as needed. Patient not taking: Reported on 05/30/2017 03/10/16   Cuthriell, Charline Bills, PA-C  valACYclovir (VALTREX) 1000 MG tablet Take 1 tablet (1,000 mg total) by mouth 2 (two) times daily. 09/15/17   Frederich Cha, MD    Family History Family History  Problem Relation Age of Onset  . CVA Mother   . CVA Father     Social History Social History  Substance Use Topics  . Smoking status: Never Smoker  . Smokeless tobacco: Never Used  . Alcohol use No     Allergies   Keflex [cephalexin]; Hydrochlorothiazide w-triamterene; Lisinopril; Penicillins; and Statins   Review of Systems Review of Systems  Skin: Positive for rash.  All other systems reviewed and are negative.    Physical Exam Triage Vital Signs ED Triage Vitals [09/15/17 1443]  Enc Vitals Group     BP 119/76     Pulse Rate 70     Resp      Temp 98.2 F (36.8 C)     Temp Source Oral     SpO2 98 %     Weight      Height      Head Circumference      Peak Flow      Pain Score      Pain Loc      Pain Edu?      Excl. in [place]?    No data found.   Updated Vital Signs BP 119/76 (BP Location: Left Arm)   Pulse 70   Temp 98.2 F (36.8 C) (Oral)   SpO2 98%   Visual Acuity Right Eye Distance:   Left Eye Distance:   Bilateral Distance:    Right Eye Near:   Left Eye Near:    Bilateral Near:     Physical Exam  Constitutional: She is oriented to person, place, and time. She appears well-developed and well-nourished.  HENT:  Head: Normocephalic.  Eyes: Pupils are equal, round, and reactive to light.  Neck: Normal range of motion. Neck supple.  Pulmonary/Chest: Effort normal.  Musculoskeletal: Normal range of motion.  Neurological: She is oriented to person, place, and time.  Skin: Rash noted. There is erythema.     Very spread out this occur rash on  her left upper thigh almost The Leg but As It Wraps It Does Go Further Distally. No Lesions on the Back at This Time  Psychiatric: She has a normal mood and affect.  Vitals reviewed.    UC Treatments / Results  Labs (all labs ordered are listed, but only abnormal results are displayed) Labs Reviewed - No data to display  EKG  EKG Interpretation None       Radiology No results  found.  Procedures Procedures (including critical care time)  Medications Ordered in UC Medications - No data to display   Initial Impression / Assessment and Plan / UC Course  I have reviewed the triage vital signs and the nursing notes.  Pertinent labs & imaging results that were available during my care of the patient were reviewed by me and considered in my medical decision making (see chart for details).     Rash suspicious for shingles. Pain. We'll place on Valtrex 1 g 3 times a day for 7 days she was a written prescription will be that to her as well. Follow-up PCP as needed once the rash may get worse  Final Clinical Impressions(s) / UC Diagnoses   Final diagnoses:  Rash  Thigh shingles    New Prescriptions Current Discharge Medication List    START taking these medications   Details  valACYclovir (VALTREX) 1000 MG tablet Take 1 tablet (1,000 mg total) by mouth 2 (two) times daily. Qty: 21 tablet, Refills: 0       Note: This dictation was prepared with Dragon dictation along with smaller phrase technology. Any transcriptional errors that result from this process are unintentional.  Controlled Substance Prescriptions [place] Controlled Substance Registry consulted? Not Applicable   Frederich Cha, MD 09/15/17 [phone]

## 2017-09-15 NOTE — ED Triage Notes (Signed)
See Previous note. Patient had left and return.

## 2017-09-15 NOTE — ED Triage Notes (Signed)
Patient complains rash on her left leg. Patient is concerned that rash may be shingles. Patient states that she noticed the area around Friday pm.

## 2018-10-09 ENCOUNTER — Other Ambulatory Visit: Payer: Self-pay | Admitting: Unknown Physician Specialty

## 2018-10-09 DIAGNOSIS — Z780 Asymptomatic menopausal state: Secondary | ICD-10-CM

## 2018-10-16 ENCOUNTER — Inpatient Hospital Stay: Admission: RE | Admit: 2018-10-16 | Payer: Medicare Other | Source: Ambulatory Visit

## 2018-11-04 ENCOUNTER — Ambulatory Visit
Admission: RE | Admit: 2018-11-04 | Discharge: 2018-11-04 | Disposition: A | Discharge status: Home / Self Care | Payer: Medicare Other | Source: Ambulatory Visit | Attending: Unknown Physician Specialty | Admitting: Unknown Physician Specialty

## 2018-11-04 DIAGNOSIS — Z780 Asymptomatic menopausal state: Secondary | ICD-10-CM | POA: Diagnosis not present

## 2018-11-10 ENCOUNTER — Other Ambulatory Visit (INDEPENDENT_AMBULATORY_CARE_PROVIDER_SITE_OTHER): Payer: Self-pay | Admitting: Vascular Surgery

## 2018-11-10 ENCOUNTER — Observation Stay
Admission: EM | Admit: 2018-11-10 | Discharge: 2018-11-11 | Disposition: A | Discharge status: Home / Self Care | Payer: Medicare Other | Attending: Internal Medicine | Admitting: Internal Medicine

## 2018-11-10 ENCOUNTER — Emergency Department: Payer: Medicare Other

## 2018-11-10 ENCOUNTER — Other Ambulatory Visit: Payer: Self-pay

## 2018-11-10 ENCOUNTER — Observation Stay
Admit: 2018-11-10 | Discharge: 2018-11-10 | Disposition: A | Discharge status: Home / Self Care | Payer: Medicare Other | Attending: Internal Medicine | Admitting: Internal Medicine

## 2018-11-10 DIAGNOSIS — R0902 Hypoxemia: Secondary | ICD-10-CM | POA: Diagnosis not present

## 2018-11-10 DIAGNOSIS — G4733 Obstructive sleep apnea (adult) (pediatric): Secondary | ICD-10-CM | POA: Insufficient documentation

## 2018-11-10 DIAGNOSIS — I2510 Atherosclerotic heart disease of native coronary artery without angina pectoris: Secondary | ICD-10-CM

## 2018-11-10 DIAGNOSIS — I10 Essential (primary) hypertension: Secondary | ICD-10-CM | POA: Insufficient documentation

## 2018-11-10 DIAGNOSIS — I480 Paroxysmal atrial fibrillation: Secondary | ICD-10-CM | POA: Diagnosis not present

## 2018-11-10 DIAGNOSIS — Z79899 Other long term (current) drug therapy: Secondary | ICD-10-CM | POA: Insufficient documentation

## 2018-11-10 DIAGNOSIS — F411 Generalized anxiety disorder: Secondary | ICD-10-CM | POA: Insufficient documentation

## 2018-11-10 DIAGNOSIS — Z7984 Long term (current) use of oral hypoglycemic drugs: Secondary | ICD-10-CM | POA: Diagnosis not present

## 2018-11-10 DIAGNOSIS — E860 Dehydration: Secondary | ICD-10-CM | POA: Insufficient documentation

## 2018-11-10 DIAGNOSIS — Z7901 Long term (current) use of anticoagulants: Secondary | ICD-10-CM | POA: Insufficient documentation

## 2018-11-10 DIAGNOSIS — K219 Gastro-esophageal reflux disease without esophagitis: Secondary | ICD-10-CM | POA: Diagnosis not present

## 2018-11-10 DIAGNOSIS — R002 Palpitations: Secondary | ICD-10-CM | POA: Diagnosis present

## 2018-11-10 DIAGNOSIS — F329 Major depressive disorder, single episode, unspecified: Secondary | ICD-10-CM | POA: Diagnosis not present

## 2018-11-10 DIAGNOSIS — E785 Hyperlipidemia, unspecified: Secondary | ICD-10-CM | POA: Diagnosis not present

## 2018-11-10 DIAGNOSIS — E1142 Type 2 diabetes mellitus with diabetic polyneuropathy: Secondary | ICD-10-CM | POA: Diagnosis not present

## 2018-11-10 DIAGNOSIS — E7800 Pure hypercholesterolemia, unspecified: Secondary | ICD-10-CM | POA: Diagnosis not present

## 2018-11-10 DIAGNOSIS — I700 Atherosclerosis of aorta: Secondary | ICD-10-CM | POA: Diagnosis not present

## 2018-11-10 DIAGNOSIS — I4891 Unspecified atrial fibrillation: Secondary | ICD-10-CM | POA: Diagnosis not present

## 2018-11-10 LAB — BASIC METABOLIC PANEL
ANION GAP: 12 (ref 5–15)
BUN: 22 mg/dL (ref 8–23)
CALCIUM: 9.3 mg/dL (ref 8.9–10.3)
CHLORIDE: 103 mmol/L (ref 98–111)
CO2: 25 mmol/L (ref 22–32)
Creatinine, Ser: 0.84 mg/dL (ref 0.44–1.00)
GFR calc Af Amer: >60 mL/min (ref >60)
GFR calc non Af Amer: >60 mL/min (ref >60)
Glucose, Bld: 221 mg/dL — ABNORMAL HIGH (ref 70–99)
POTASSIUM: 3.5 mmol/L (ref 3.5–5.1)
Sodium: 140 mmol/L (ref 135–145)

## 2018-11-10 LAB — HEPATIC FUNCTION PANEL
ALT: 14 U/L (ref 0–44)
AST: 28 U/L (ref 15–41)
Albumin: 4.3 g/dL (ref 3.5–5.0)
Alkaline Phosphatase: 100 U/L (ref 38–126)
BILIRUBIN TOTAL: 0.4 mg/dL (ref 0.3–1.2)
Total Protein: 7.7 g/dL (ref 6.5–8.1)

## 2018-11-10 LAB — CBC
HEMATOCRIT: 46.6 % — AB (ref 36.0–46.0)
HEMOGLOBIN: 15 g/dL (ref 12.0–15.0)
MCH: 28.2 pg (ref 26.0–34.0)
MCHC: 32.2 g/dL (ref 30.0–36.0)
MCV: 87.6 fL (ref 80.0–100.0)
Platelets: 288 10*3/uL (ref 150–400)
RBC: 5.32 MIL/uL — AB (ref 3.87–5.11)
RDW: 13.4 % (ref 11.5–15.5)
WBC: 7.7 10*3/uL (ref 4.0–10.5)
nRBC: 0 % (ref 0.0–0.2)

## 2018-11-10 LAB — GLUCOSE, CAPILLARY
Glucose-Capillary: 200 mg/dL — ABNORMAL HIGH (ref 70–99)
Glucose-Capillary: 144 mg/dL — ABNORMAL HIGH (ref 70–99)
Glucose-Capillary: 183 mg/dL — ABNORMAL HIGH (ref 70–99)

## 2018-11-10 LAB — PHOSPHORUS: Phosphorus: 3.3 mg/dL (ref 2.5–4.6)

## 2018-11-10 LAB — ECHOCARDIOGRAM COMPLETE
HEIGHTINCHES: 57 in
Weight: 2121.6 oz

## 2018-11-10 LAB — TROPONIN I: Troponin I: <0.03 ng/mL (ref <0.03)

## 2018-11-10 LAB — BRAIN NATRIURETIC PEPTIDE: B NATRIURETIC PEPTIDE 5: 93 pg/mL (ref 0.0–100.0)

## 2018-11-10 LAB — MAGNESIUM: MAGNESIUM: 1.6 mg/dL — AB (ref 1.7–2.4)

## 2018-11-10 MED ORDER — MORPHINE SULFATE (PF) 4 MG/ML IV SOLN: 4.0000 mg | INTRAVENOUS | Status: DC | PRN

## 2018-11-10 MED ORDER — LACTATED RINGERS IV SOLN: INTRAVENOUS | Status: DC

## 2018-11-10 MED ORDER — KCL-LACTATED RINGERS 20 MEQ/L IV SOLN
INTRAVENOUS | Status: DC
  Filled 2018-11-10: qty 1000

## 2018-11-10 MED ORDER — VALACYCLOVIR HCL 500 MG PO TABS
1000.0000 mg | ORAL_TABLET | Freq: Two times a day (BID) | ORAL | Status: DC
  Filled 2018-11-10 (×3): qty 2

## 2018-11-10 MED ORDER — MAGNESIUM SULFATE 2 GM/50ML IV SOLN
2.0000 g | Freq: Once | INTRAVENOUS | Status: AC
  Administered 2018-11-10: 2 g via INTRAVENOUS
  Filled 2018-11-10: qty 50

## 2018-11-10 MED ORDER — ONDANSETRON HCL 4 MG/2ML IJ SOLN: 4.0000 mg | Freq: Four times a day (QID) | INTRAMUSCULAR | Status: DC | PRN

## 2018-11-10 MED ORDER — DILTIAZEM HCL 30 MG PO TABS
120.0000 mg | ORAL_TABLET | Freq: Every day | ORAL | Status: DC
  Administered 2018-11-10 – 2018-11-11 (×2): 120 mg via ORAL
  Filled 2018-11-10 (×2): qty 4

## 2018-11-10 MED ORDER — ASPIRIN EC 81 MG PO TBEC
81.0000 mg | DELAYED_RELEASE_TABLET | Freq: Every day | ORAL | Status: DC
  Filled 2018-11-10 (×2): qty 1

## 2018-11-10 MED ORDER — RIVAROXABAN 20 MG PO TABS
20.0000 mg | ORAL_TABLET | Freq: Every day | ORAL | Status: DC
  Administered 2018-11-10: 20 mg via ORAL
  Filled 2018-11-10: qty 1

## 2018-11-10 MED ORDER — IOHEXOL 350 MG/ML SOLN
75.0000 mL | Freq: Once | INTRAVENOUS | Status: AC | PRN
  Administered 2018-11-10: 75 mL via INTRAVENOUS

## 2018-11-10 MED ORDER — KCL-LACTATED RINGERS 20 MEQ/L IV SOLN
INTRAVENOUS | Status: DC
  Administered 2018-11-10: 07:00:00 via INTRAVENOUS
  Filled 2018-11-10: qty 1000

## 2018-11-10 MED ORDER — DILTIAZEM HCL 60 MG PO TABS: 30.0000 mg | ORAL_TABLET | Freq: Three times a day (TID) | ORAL | Status: DC | PRN

## 2018-11-10 MED ORDER — SENNOSIDES-DOCUSATE SODIUM 8.6-50 MG PO TABS: 1.0000 | ORAL_TABLET | Freq: Every evening | ORAL | Status: DC | PRN

## 2018-11-10 MED ORDER — ROSUVASTATIN CALCIUM 5 MG PO TABS
5.0000 mg | ORAL_TABLET | ORAL | Status: DC
  Administered 2018-11-10: 5 mg via ORAL
  Filled 2018-11-10: qty 1

## 2018-11-10 MED ORDER — LOSARTAN POTASSIUM 50 MG PO TABS
100.0000 mg | ORAL_TABLET | Freq: Every day | ORAL | Status: DC
  Administered 2018-11-10 – 2018-11-11 (×2): 100 mg via ORAL
  Filled 2018-11-10 (×2): qty 2

## 2018-11-10 MED ORDER — INSULIN ASPART 100 UNIT/ML ~~LOC~~ SOLN: 0.0000 [IU] | Freq: Every day | SUBCUTANEOUS | Status: DC

## 2018-11-10 MED ORDER — MORPHINE SULFATE (PF) 2 MG/ML IV SOLN: 2.0000 mg | INTRAVENOUS | Status: DC | PRN

## 2018-11-10 MED ORDER — PANTOPRAZOLE SODIUM 40 MG PO TBEC
40.0000 mg | DELAYED_RELEASE_TABLET | Freq: Every day | ORAL | Status: DC
  Administered 2018-11-10 – 2018-11-11 (×2): 40 mg via ORAL
  Filled 2018-11-10 (×2): qty 1

## 2018-11-10 MED ORDER — MAGNESIUM OXIDE 400 (241.3 MG) MG PO TABS
400.0000 mg | ORAL_TABLET | Freq: Two times a day (BID) | ORAL | Status: DC
  Administered 2018-11-10 – 2018-11-11 (×3): 400 mg via ORAL
  Filled 2018-11-10 (×3): qty 1

## 2018-11-10 MED ORDER — BISACODYL 5 MG PO TBEC: 5.0000 mg | DELAYED_RELEASE_TABLET | Freq: Every day | ORAL | Status: DC | PRN

## 2018-11-10 MED ORDER — INSULIN ASPART 100 UNIT/ML ~~LOC~~ SOLN
0.0000 [IU] | Freq: Three times a day (TID) | SUBCUTANEOUS | Status: DC
  Administered 2018-11-10: 3 [IU] via SUBCUTANEOUS
  Administered 2018-11-10: 2 [IU] via SUBCUTANEOUS
  Administered 2018-11-11: 3 [IU] via SUBCUTANEOUS
  Filled 2018-11-10 (×4): qty 1

## 2018-11-10 MED ORDER — DILTIAZEM HCL ER COATED BEADS 180 MG PO CP24
180.0000 mg | ORAL_CAPSULE | Freq: Every evening | ORAL | Status: DC
  Administered 2018-11-10: 180 mg via ORAL
  Filled 2018-11-10: qty 1

## 2018-11-10 MED ORDER — ESCITALOPRAM OXALATE 10 MG PO TABS
5.0000 mg | ORAL_TABLET | Freq: Every day | ORAL | Status: DC
  Administered 2018-11-10: 5 mg via ORAL
  Filled 2018-11-10 (×2): qty 0.5

## 2018-11-10 MED ORDER — ACETAMINOPHEN 325 MG PO TABS
650.0000 mg | ORAL_TABLET | ORAL | Status: DC | PRN
  Administered 2018-11-10: 650 mg via ORAL
  Filled 2018-11-10: qty 2

## 2018-11-10 MED ORDER — GUANFACINE HCL 1 MG PO TABS
1.0000 mg | ORAL_TABLET | Freq: Two times a day (BID) | ORAL | Status: DC
  Administered 2018-11-10 – 2018-11-11 (×3): 1 mg via ORAL
  Filled 2018-11-10 (×4): qty 1

## 2018-11-10 NOTE — Care Management Obs Status (Signed)
[facility] NOTIFICATION   Patient Details  Name: Vanessa Young MRN: [id number] Date of Birth: 1941/02/20   Medicare Observation Status Notification Given:  Yes    Elza Rafter, RN 11/10/2018, 3:40 PM

## 2018-11-10 NOTE — ED Notes (Signed)
Patient transported to CT 

## 2018-11-10 NOTE — ED Triage Notes (Signed)
Patient coming [facility] from home for "racing heart" that woke her from sleep at 245 this am. Patient also reported burning chest pain upon awakening. Patient took prescribed dose of 30 mg Cardizem prior to EMS arrival.   EMS vitals: Heart Rate 110, BP 167/82, 97% on RA

## 2018-11-10 NOTE — Consult Note (Signed)
[facility] Cardiology  CARDIOLOGY CONSULT NOTE  Patient ID: Vanessa Young MRN: [id number] DOB/AGE: 01-05-1941 77 y.o.  Admit date: 11/10/2018 Referring Physician Dr. Brett Albino Primary Physician [facility] Primary Cardiologist Dr. Ubaldo Glassing Reason for Consultation Atrial fibrillation with RVR  HPI: Vanessa Young is a 77 year old female with a past medical history significant for paroxsymal atrial fibrillation, on Xarelto, essential hypertension, hypercholesterolemia, type 2 diabetes, and obstructive sleep apnea who presented to the ED on 11/10/18 with palpitations.  Around 2:30am this morning, Vanessa Young was awoken from her sleep with palpitations.  Took 30mg  of diltiazem and called EMS.  Upon EMS arrival, patient was noted to be in atrial fibrillation with RVR with a rate between 110 and 120.  Denied associated chest pain or shortness of breath.  Denied dizziness or lightheadedness.  Vanessa Young had another brief episode of palpitations in the ED with ambulation, but has been asymptomatic since.   Compliant with diltiazem and Xarelto daily.  Admits to bruising but denies evidence of bleeding.   Follows up with Dr. Ubaldo Glassing at [facility].  Takes diltiazem 120mg  every morning and diltiazem 180mg  every evening which has worked well for her.  Echocardiogram revealed normal LV function with an EF estimated at 55% to 60% with mild LVH and grade 1 diastolic dysfunction.     Review of systems complete and found to be negative unless listed above     Past Medical History:  Diagnosis Date  . A-fib ([place])   . Depression   . Diabetes mellitus without complication ([place])   . GERD (gastroesophageal reflux disease)   . Hypercholesteremia   . Hypertension     Past Surgical History:  Procedure Laterality Date  . CYSTOSCOPY      Medications Prior to Admission  Medication Sig Dispense Refill Last Dose  . allopurinol (ZYLOPRIM) 100 MG tablet Take 100 mg by mouth.   11/09/2018 at 2000  . diltiazem (CARDIZEM) 120 MG  tablet Take 120 mg by mouth daily. Patient takes 120mg  tablet in the am and 180mg  in the evening   11/09/2018 at 0800  . diltiazem (DILACOR XR) 180 MG 24 hr capsule Take 180 mg by mouth every evening.   11/09/2018 at 2000  . escitalopram (LEXAPRO) 5 MG tablet Take 5 mg by mouth at bedtime.    11/09/2018 at 2000  . furosemide (LASIX) 20 MG tablet Take 20 mg by mouth.   11/09/2018 at 0800  . guanFACINE (TENEX) 1 MG tablet Take 1 mg by mouth 2 (two) times daily.    11/09/2018 at 2000  . losartan (COZAAR) 100 MG tablet Take 100 mg by mouth daily.   11/09/2018 at 2000  . magnesium oxide (MAG-OX) 400 MG tablet Take 400 mg by mouth daily.    11/09/2018 at 0800  . metFORMIN (GLUMETZA) 500 MG (MOD) 24 hr tablet Take 500 mg by mouth daily with breakfast.   11/09/2018 at 0800  . omeprazole (PRILOSEC) 20 MG capsule Take 20 mg by mouth daily.   11/09/2018 at 2000  . rivaroxaban (XARELTO) 20 MG TABS tablet Take 20 mg by mouth daily with supper.   11/09/2018 at 0800  . rosuvastatin (CRESTOR) 5 MG tablet Take 5 mg by mouth daily. Daily on Monday and Thursday   Past Week at Unknown time  . valACYclovir (VALTREX) 1000 MG tablet Take 1 tablet (1,000 mg total) by mouth 2 (two) times daily. 21 tablet 0 prn at prn   Social History   Socioeconomic History  .  Marital status: Widowed    Spouse name: Not on file  . Number of children: Not on file  . Years of education: Not on file  . Highest education level: Not on file  Occupational History  . Not on file  Social Needs  . Financial resource strain: Not on file  . Food insecurity:    Worry: Not on file    Inability: Not on file  . Transportation needs:    Medical: Not on file    Non-medical: Not on file  Tobacco Use  . Smoking status: Never Smoker  . Smokeless tobacco: Never Used  Substance and Sexual Activity  . Alcohol use: No  . Drug use: No  . Sexual activity: Not on file  Lifestyle  . Physical activity:    Days per week: Not on file    Minutes  per session: Not on file  . Stress: Not on file  Relationships  . Social connections:    Talks on phone: Not on file    Gets together: Not on file    Attends religious service: Not on file    Active member of club or organization: Not on file    Attends meetings of clubs or organizations: Not on file    Relationship status: Not on file  . Intimate partner violence:    Fear of current or ex partner: Not on file    Emotionally abused: Not on file    Physically abused: Not on file    Forced sexual activity: Not on file  Other Topics Concern  . Not on file  Social History Narrative  . Not on file    Family History  Problem Relation Age of Onset  . CVA Mother   . CVA Father       Review of systems complete and found to be negative unless listed above      PHYSICAL EXAM  General: Well developed, well nourished, sitting up in bed in no acute distress HEENT:  Normocephalic and atramatic Neck:  No JVD.  Lungs: On supplemental O2. Clear bilaterally to auscultation and percussion. Heart: HRRR . Normal S1 and S2 without gallops or murmurs.  Abdomen: Bowel sounds are positive, abdomen soft and non-tender  Msk:  Back normal. Normal strength and tone for age. Gait not assessed Extremities: No clubbing, cyanosis or edema.   Neuro: Alert and oriented X 3. Psych:  Good affect, responds appropriately  Labs:   Lab Results  Component Value Date   WBC 7.7 11/10/2018   HGB 15.0 11/10/2018   HCT 46.6 (H) 11/10/2018   MCV 87.6 11/10/2018   PLT 288 11/10/2018    Recent Labs  Lab 11/10/18 0431  NA 140  K 3.5  CL 103  CO2 25  BUN 22  CREATININE 0.84  CALCIUM 9.3  PROT 7.7  BILITOT 0.4  ALKPHOS 100  ALT 14  AST 28  GLUCOSE 221*   Lab Results  Component Value Date   CKTOTAL 39 09/27/2013   CKMB 1.0 09/27/2013   TROPONINI <0.03 11/10/2018   No results found for: CHOL No results found for: HDL No results found for: LDLCALC No results found for: TRIG No results found  for: CHOLHDL No results found for: LDLDIRECT    Radiology: Dg Chest 2 View  Result Date: 11/10/2018 CLINICAL DATA:  Chest pain. EXAM: CHEST - 2 VIEW COMPARISON:  Radiograph 09/30/2015 FINDINGS: The cardiomediastinal contours are normal. Aortic atherosclerosis. Pulmonary vasculature is normal. No consolidation, pleural effusion, or pneumothorax.  No acute osseous abnormalities are seen. IMPRESSION: 1. No acute findings. 2.  Aortic Atherosclerosis (ICD10-I70.0). Electronically Signed   By: Keith Rake M.D.   On: 11/10/2018 05:15   Ct Angio Chest Pe W/cm &/or Wo Cm  Result Date: 11/10/2018 CLINICAL DATA:  PE suspected, high pretest prob. Chest pain and racing heart. EXAM: CT ANGIOGRAPHY CHEST WITH CONTRAST TECHNIQUE: Multidetector CT imaging of the chest was performed using the standard protocol during bolus administration of intravenous contrast. Multiplanar CT image reconstructions and MIPs were obtained to evaluate the vascular anatomy. CONTRAST:  86mL OMNIPAQUE IOHEXOL 350 MG/ML SOLN COMPARISON:  Radiograph earlier this day. FINDINGS: Cardiovascular: There are no filling defects within the pulmonary arteries to suggest pulmonary embolus. Thoracic aorta is normal in caliber without dissection. There is calcified and noncalcified atheromatous plaque primarily of the arch and descending aorta. Focal ulcerated plaque of the distal thoracic aorta just proximal to the diaphragmatic hiatus without periaortic stranding. No aortic dissection. There are coronary artery calcifications. Mediastinum/Nodes: Multiple small mediastinal nodes not enlarged by size criteria. No hilar adenopathy. No visualized thyroid nodule. The esophagus is nondistended. Lungs/Pleura: Dependent hypoventilatory changes in the bases. No confluent consolidation. Small subpleural blebs in the right lower lobe. Scattered subpleural atelectasis or scarring in the right middle lobe. Calcified right middle lobe granuloma. No pulmonary mass.  The trachea and mainstem bronchi are patent. Upper Abdomen: No acute findings. Atherosclerosis of upper abdominal vasculature. Musculoskeletal: Bone island within L1, partially included. There are no acute or suspicious osseous abnormalities. Review of the MIP images confirms the above findings. IMPRESSION: 1. No pulmonary embolus. 2. Aortic atherosclerosis with calcified and noncalcified plaque. Small ulcerated plaque in the distal thoracic aorta. Aortic Atherosclerosis (ICD10-I70.0). 3. Coronary artery calcifications. Electronically Signed   By: Keith Rake M.D.   On: 11/10/2018 05:48   Dg Bone Density (dxa)  Result Date: 11/04/2018 EXAM: DUAL X-RAY ABSORPTIOMETRY (DXA) FOR BONE MINERAL DENSITY IMPRESSION: crr Your patient Janice Bodine completed a BMD test on 11/04/2018 using the [place] (analysis version: 14.10) manufactured by [employer]. The following summarizes the results of our evaluation. PATIENT BIOGRAPHICAL: Name: Sherill, Mangen Patient ID: [id number] Birth Date: 1941/07/07 Height: 58.0 in. Gender: Female Exam Date: 11/04/2018 Weight: 140.4 lbs. Indications: Advanced Age, Caucasian, diabetic, Height Loss, Low Body Weight, POSTmenopausal, skin ca Fractures: Treatments: metformin, Vitamin D, XARELTO ASSESSMENT: The BMD measured at Forearm Radius 33% is 0.711 g/cm2 with a T-score of -1.9. This patient is considered osteopenic according to [facility]) criteria.scan quality is good. Site Region Measured Measured WHO Young Adult BMD Date       Age      Classification T-score AP Spine L1-L4 11/04/2018 78.7 Normal -0.4 1.129 g/cm2 DualFemur Neck Left 11/04/2018 78.7 Osteopenia -1.8 0.789 g/cm2 Right Forearm Radius 33% 11/04/2018 78.7 Osteopenia -1.9 0.711 g/cm2 World Health Organization [facility]) criteria for post-menopausal, Caucasian Women: Normal:       T-score at or above -1 SD Osteopenia:   T-score between -1 and -2.5 SD Osteoporosis: T-score at or below -2.5  SD RECOMMENDATIONS: 1. All patients should optimize calcium and vitamin D intake. 2. Consider FDA-approved medical therapies in postmenopausal women and men aged 64 years and older, based on the following: a. A hip or vertebral (clinical or morphometric) fracture b. T-score < -2.5 at the femoral neck or spine after appropriate evaluation to exclude secondary causes c. Low bone mass (T-score between -1.0 and -2.5 at the femoral neck or spine) and  a 10-year probability of a hip fracture > 3% or a 10-year probability of a major osteoporosis-related fracture > 20% based on the [place]-adapted WHO algorithm d. Clinician judgment and/or patient preferences may indicate treatment for people with 10-year fracture probabilities above or below these levels FOLLOW-UP: Patients with diagnosed cases of osteoporosis or at high risk for fracture should have regular bone mineral density tests. For patients eligible for Medicare, routine testing is allowed once every 2 years. The testing frequency can be increased to one year for patients who have rapidly progressing disease, those who are receiving or discontinuing medical therapy to restore bone mass, or have additional risk factors. I have reviewed this report, and agree with the above findings. [facility] crr Your patient VARIE MACHAMER completed a FRAX assessment on 11/04/2018 using the [place] (analysis version: 14.10) manufactured by [employer]. The following summarizes the results of our evaluation. PATIENT BIOGRAPHICAL: Name: Evangelina, Delancey Patient ID: [id number] Birth Date: 1941-12-14 Height:    58.0 in. Gender:     [age]    [age]       Weight:    140.4 lbs. Ethnicity:  White                            Exam Date: 11/04/2018 FRAX* RESULTS:  (version: 3.5) 10-year Probability of Fracture1 Major Osteoporotic Fracture2 Hip Fracture 13.9% 3.6% Population: [place] (Caucasian) Risk Factors: None Based on Femur (Left) Neck BMD 1 -The 10-year  probability of fracture may be lower than reported if the patient has received treatment. 2 -Major Osteoporotic Fracture: Clinical Spine, Forearm, Hip or Shoulder *FRAX is a Materials engineer of the [employer] for Metabolic Bone Disease, a [place] (WHO) [facility]. ASSESSMENT: The probability of a major osteoporotic fracture is 13.9% within the next ten years. The probability of a hip fracture is 3.6% within the next ten years. Electronically Signed   By: Rolm Baptise M.D.   On: 11/04/2018 10:58    EKG: Normal sinus rhythm with rate of 85  ASSESSMENT AND PLAN:  1.  Atrial fibrillation   -Now back in NSR with controlled rate  -Will continue with daily medication regimen of diltiazem 120mg  in the morning and 180mg  in the evening  -Continue with Xarelto   -Follow up outpatient cardiology  2.  Hypertension  -Continue current home medication regimen of losartan, diltiazem, and furosemide  3.  Obstructive sleep apnea  -Continue nightly compliance with CPAP  4.  Type 2 diabetes  -Continue current medications  5.  Hypercholesterolemia   -Continue rosuvastatin 5mg     The history, physical exam findings, and plan of care were all discussed with Dr. Bartholome Bill, and all decision making was made in collaboration.   Signed: Avie Arenas PA-C 11/10/2018, 4:35 PM

## 2018-11-10 NOTE — H&P (Addendum)
[facility] [place] NAME: Vanessa Young    MR#:  [id number]  DATE OF BIRTH:  11/01/1941  DATE OF ADMISSION:  11/10/2018  PRIMARY CARE PHYSICIAN: [facility]   REQUESTING/REFERRING PHYSICIAN: Darel Hong, MD  CHIEF COMPLAINT:   Chief Complaint  Patient presents with  . Palpitations    HISTORY OF PRESENT ILLNESS:  Vanessa Young  is a 77 y.o. female with a known history of T2NIDDM, HTN, HLD, paroxysmal Afib (Xarelto) p/w palpitations and dehydration. Pt states she was well and in her usual state of health during the day on Sunday (11/09/2018). She states she went to bed as usual. She was woken from sleep @~0200-0215AM (Monday 11/10/2018) by a burning sensation throughout her upper body (chest, arms, neck). She states she got up, and developed palpitations. She called EMS and then took a 30mg  Diltiazem to see if that would help. When she got up, she noticed she her legs were weak and she was very shaky. She states she sat down and waited for EMS to help her up. She was in RVR at the time of arrival to ED, but spontaneously converted back to sinus rhythm w/o intervention. She is in sinus at the time of my assessment, HR 80s-90s. Denies N/V, diaphoresis, LH/LOC. She is comfortable, well-appearing and in no acute distress at the time of my assessment.  PAST MEDICAL HISTORY:   Past Medical History:  Diagnosis Date  . A-fib ([place])   . Depression   . Diabetes mellitus without complication ([place])   . GERD (gastroesophageal reflux disease)   . Hypercholesteremia   . Hypertension     PAST SURGICAL HISTORY:   Past Surgical History:  Procedure Laterality Date  . CYSTOSCOPY      SOCIAL HISTORY:   Social History   Tobacco Use  . Smoking status: Never Smoker  . Smokeless tobacco: Never Used  Substance Use Topics  . Alcohol use: No    FAMILY HISTORY:   Family History  Problem Relation Age of Onset  . CVA Mother   . CVA  Father     DRUG ALLERGIES:   Allergies  Allergen Reactions  . Keflex [Cephalexin] Hives  . Hydrochlorothiazide W-Triamterene Nausea Only  . Lisinopril Other (See Comments)    Unknown    . Penicillins Hives  . Statins Other (See Comments)    Unknown     REVIEW OF SYSTEMS:   Review of Systems  Constitutional: Negative for chills, diaphoresis, fever, malaise/fatigue and weight loss.  HENT: Negative for congestion, ear pain, hearing loss, nosebleeds, sinus pain, sore throat and tinnitus.   Eyes: Negative for blurred vision, double vision, photophobia, pain, discharge and redness.  Respiratory: Negative for cough, hemoptysis, sputum production, shortness of breath and wheezing.   Cardiovascular: Positive for palpitations. Negative for chest pain, orthopnea, claudication, leg swelling and PND.  Gastrointestinal: Negative for abdominal pain, blood in stool, constipation, diarrhea, heartburn, melena, nausea and vomiting.  Genitourinary: Negative for dysuria, flank pain, frequency, hematuria and urgency.  Musculoskeletal: Negative for back pain, falls, joint pain, myalgias and neck pain.  Skin: Negative for itching and rash.  Neurological: Positive for weakness. Negative for dizziness, tingling, tremors, sensory change, speech change, focal weakness, seizures, loss of consciousness and headaches.  Psychiatric/Behavioral: Negative for memory loss. The patient does not have insomnia.    MEDICATIONS AT HOME:   Prior to Admission medications   Medication Sig Start Date End Date Taking? Authorizing Provider  azithromycin (ZITHROMAX) 500 MG tablet 1 tablet daily for 5 days Patient not taking: Reported on 05/30/2017 01/05/16   Frederich Cha, MD  benzonatate (TESSALON PERLES) 100 MG capsule Take 1 capsule (100 mg total) by mouth 3 (three) times daily as needed. Patient not taking: Reported on 05/30/2017 03/13/17   Marylene Land, NP  diltiazem (CARDIZEM) 120 MG tablet Take 120 mg by mouth daily.      [provider]  diltiazem (DILACOR XR) 180 MG 24 hr capsule Take 180 mg by mouth every evening.    [provider]  escitalopram (LEXAPRO) 5 MG tablet Take 5 mg by mouth at bedtime.     [provider]  fexofenadine-pseudoephedrine (ALLEGRA-D ALLERGY & CONGESTION) 180-240 MG 24 hr tablet Take 1 tablet by mouth daily. Patient not taking: Reported on 05/30/2017 01/05/16   Frederich Cha, MD  guanFACINE (TENEX) 1 MG tablet Take 1 mg by mouth 2 (two) times daily.     [provider]  losartan (COZAAR) 100 MG tablet Take 100 mg by mouth daily.    [provider]  magnesium oxide (MAG-OX) 400 MG tablet Take 400 mg by mouth 2 (two) times daily.     [provider]  metFORMIN (GLUMETZA) 500 MG (MOD) 24 hr tablet Take 500 mg by mouth daily with breakfast.    [provider]  omeprazole (PRILOSEC) 20 MG capsule Take 20 mg by mouth daily.    [provider]  oseltamivir (TAMIFLU) 75 MG capsule Take 1 capsule (75 mg total) by mouth every 12 (twelve) hours. Patient not taking: Reported on 05/30/2017 03/13/17   Marylene Land, NP  rivaroxaban (XARELTO) 20 MG TABS tablet Take 20 mg by mouth daily with supper.    [provider]  rosuvastatin (CRESTOR) 5 MG tablet Take 5 mg by mouth daily. Daily on Monday and Thursday    [provider]  traMADol (ULTRAM) 50 MG tablet Take 1 tablet (50 mg total) by mouth every 6 (six) hours as needed. Patient not taking: Reported on 05/30/2017 03/10/16   Cuthriell, Charline Bills, PA-C  valACYclovir (VALTREX) 1000 MG tablet Take 1 tablet (1,000 mg total) by mouth 2 (two) times daily. 09/15/17   Frederich Cha, MD      VITAL SIGNS:  Blood pressure (!) 145/77, pulse 90, resp. rate (!) 21, SpO2 93 %.  PHYSICAL EXAMINATION:  Physical Exam  Constitutional: She is oriented to person, place, and time. She appears well-developed and well-nourished. She is active and cooperative.  Non-toxic appearance.  She does not have a sickly appearance. She does not appear ill. No distress. She is not intubated.  HENT:  Head: Normocephalic and atraumatic.  Mouth/Throat: Oropharynx is clear and moist. No oropharyngeal exudate.  Eyes: Conjunctivae, EOM and lids are normal. No scleral icterus.  Neck: Neck supple. No JVD present. No thyromegaly present.  Cardiovascular: Normal rate, regular rhythm, S1 normal, S2 normal and normal heart sounds.  No extrasystoles are present. Exam reveals no gallop, no S3, no S4, no distant heart sounds and no friction rub.  No murmur heard. Pulmonary/Chest: Effort normal. No accessory muscle usage or stridor. No apnea and no tachypnea. She is not intubated. No respiratory distress. She has no decreased breath sounds. She has no wheezes. She has no rhonchi. She has no rales.  Abdominal: Soft. Bowel sounds are normal. She exhibits no distension. There is no tenderness. There is no rigidity, no rebound and no guarding.  Musculoskeletal: Normal range of motion. She exhibits no  edema or tenderness.  Lymphadenopathy:    She has no cervical adenopathy.  Neurological: She is alert and oriented to person, place, and time. She is not disoriented.  Skin: Skin is warm, dry and intact. No rash noted. She is not diaphoretic. No erythema.  Psychiatric: She has a normal mood and affect. Her speech is normal and behavior is normal. Judgment and thought content normal. Cognition and memory are normal.   In sinus at the time of my assessment, HR 80s-90s. Lungs diminished but clear. Comfortable, well-appearing, no distress. LABORATORY PANEL:   CBC Recent Labs  Lab 11/10/18 0431  WBC 7.7  HGB 15.0  HCT 46.6*  PLT 288   ------------------------------------------------------------------------------------------------------------------  Chemistries  Recent Labs  Lab 11/10/18 0431  NA 140  K 3.5  CL 103  CO2 25  GLUCOSE 221*  BUN 22  CREATININE 0.84  CALCIUM 9.3  AST 28  ALT 14    ALKPHOS 100  BILITOT 0.4   ------------------------------------------------------------------------------------------------------------------  Cardiac Enzymes Recent Labs  Lab 11/10/18 0431  TROPONINI <0.03   ------------------------------------------------------------------------------------------------------------------  RADIOLOGY:  Dg Chest 2 View  Result Date: 11/10/2018 CLINICAL DATA:  Chest pain. EXAM: CHEST - 2 VIEW COMPARISON:  Radiograph 09/30/2015 FINDINGS: The cardiomediastinal contours are normal. Aortic atherosclerosis. Pulmonary vasculature is normal. No consolidation, pleural effusion, or pneumothorax. No acute osseous abnormalities are seen. IMPRESSION: 1. No acute findings. 2.  Aortic Atherosclerosis (ICD10-I70.0). Electronically Signed   By: Keith Rake M.D.   On: 11/10/2018 05:15   Ct Angio Chest Pe W/cm &/or Wo Cm  Result Date: 11/10/2018 CLINICAL DATA:  PE suspected, high pretest prob. Chest pain and racing heart. EXAM: CT ANGIOGRAPHY CHEST WITH CONTRAST TECHNIQUE: Multidetector CT imaging of the chest was performed using the standard protocol during bolus administration of intravenous contrast. Multiplanar CT image reconstructions and MIPs were obtained to evaluate the vascular anatomy. CONTRAST:  80mL OMNIPAQUE IOHEXOL 350 MG/ML SOLN COMPARISON:  Radiograph earlier this day. FINDINGS: Cardiovascular: There are no filling defects within the pulmonary arteries to suggest pulmonary embolus. Thoracic aorta is normal in caliber without dissection. There is calcified and noncalcified atheromatous plaque primarily of the arch and descending aorta. Focal ulcerated plaque of the distal thoracic aorta just proximal to the diaphragmatic hiatus without periaortic stranding. No aortic dissection. There are coronary artery calcifications. Mediastinum/Nodes: Multiple small mediastinal nodes not enlarged by size criteria. No hilar adenopathy. No visualized thyroid nodule. The  esophagus is nondistended. Lungs/Pleura: Dependent hypoventilatory changes in the bases. No confluent consolidation. Small subpleural blebs in the right lower lobe. Scattered subpleural atelectasis or scarring in the right middle lobe. Calcified right middle lobe granuloma. No pulmonary mass. The trachea and mainstem bronchi are patent. Upper Abdomen: No acute findings. Atherosclerosis of upper abdominal vasculature. Musculoskeletal: Bone island within L1, partially included. There are no acute or suspicious osseous abnormalities. Review of the MIP images confirms the above findings. IMPRESSION: 1. No pulmonary embolus. 2. Aortic atherosclerosis with calcified and noncalcified plaque. Small ulcerated plaque in the distal thoracic aorta. Aortic Atherosclerosis (ICD10-I70.0). 3. Coronary artery calcifications. Electronically Signed   By: Keith Rake M.D.   On: 11/10/2018 05:48   IMPRESSION AND PLAN:   A/P: 3F p/w palpitations, AF + RVR, dehydration. Incidentally found to be hypoxic, CTA chest to evaluate for PE was (-) PE but (+) "Small ulcerated plaque in the distal thoracic aorta." Hyperglycemia (w/ T2NIDDM), hypomagnesemia. -Palpitations, AF + RVR, dehydration: Converted to sinus while in ED. HR WNL. C/w home rate ctrl +  anticoagulation. Endorses dehydration. Labwork demonstrates BUN/Cr ratio 22/0.84 = ~26.2. Suspect RVR 2/2 dehydration. IVF. Prior Echo w/ normal EF, however pt has not had an Echo since 2014; pending. -Hypoxia (incidental): SpO2 92% on room air. Lungs clear. CTA chest (-) PE, effusion, edema, infiltrate, consolidation, etc. Denies SOB. Echo to evaluate for pulmonary HTN. Supplemental O2. -Thoracic aortic ulcerated plaque (incidental): CTA chest (+) "Small ulcerated plaque in the distal thoracic aorta." Already on Xarelto and statin. Vascular surgery consult appreciated. -Hyperglycemia, T2NIDDM: SSI. Hold home antihyperglycemics. -Hypomagnesemia: K+ WNL @ 3.5. Replete Mag. -c/w home  meds/formulary subs. -FEN/GI: Cardiac diabetic diet. -DVT PPx: Xarelto. -Code status: Full code. -Disposition: Observation, < 2 midnights.   All the records are reviewed and case discussed with ED provider. Management plans discussed with the patient, family and they are in agreement.  CODE STATUS: Full code.  TOTAL TIME TAKING CARE OF THIS PATIENT: 75 minutes.    Arta Silence M.D on 11/10/2018 at 6:46 AM  Between 7am to 6pm - Pager - [phone]  After 6pm go to [email or web address] - Proofreader  [facility]  Office  [phone]  CC: Primary care physician; [facility]   Note: This dictation was prepared with Dragon dictation along with smaller phrase technology. Any transcriptional errors that result from this process are unintentional.

## 2018-11-10 NOTE — Progress Notes (Signed)
*  PRELIMINARY RESULTS* Echocardiogram 2D Echocardiogram has been performed.  Vanessa Young 11/10/2018, 10:57 AM

## 2018-11-10 NOTE — Care Management Obs Status (Signed)
[facility] NOTIFICATION   Patient Details  Name: Vanessa Young MRN: [id number] Date of Birth: [birth year]   Medicare Observation Status Notification Given:  Yes   Tablet would not accept signature, Geoffry Paradise, RN    Elza Rafter, RN 11/10/2018, 3:40 PM

## 2018-11-10 NOTE — ED Notes (Signed)
ED Provider at bedside. 

## 2018-11-10 NOTE — Progress Notes (Signed)
Patient admitted early this morning. Seen and examined by me today. Doing well. Denies any palpitations, chest pain, or shortness of breath.  On exam, she is well-appearing. RRR. Lungs CTAB.  -Agree with admission H&P -Continue diltiazem at current dose -Seen by vascular- recommended aspirin  -Patient requesting cardiology consult  Hyman Bible, MD

## 2018-11-10 NOTE — ED Notes (Signed)
Patient placed on 2L [place] per MD [place] request

## 2018-11-10 NOTE — ED Notes (Signed)
Patient ambulated by ED Physician.

## 2018-11-10 NOTE — ED Notes (Signed)
Admitting Provider at bedside. 

## 2018-11-10 NOTE — Consult Note (Signed)
[facility] Vascular Consult Note  MRN : [id number]  Vanessa Young is a 77 y.o. (11/27/1941) female who presents with chief complaint of  Chief Complaint  Patient presents with  . Palpitations   History of Present Illness:  The patient is a 77 year old female with a past medical history of atrial fibrillation, hypertension, sleep apnea, type 2 diabetes, GERD, hypercholesteremia, generalized anxiety disorder who presented to [facility] emergency department with chest pain.  The patient endorses a history of being in her normal state of health however woke up early on the with a "burning sensation" throughout her chest arms and neck.  Patient notes experiencing chest pain and palpitations with this burning sensation.  Patient called EMS.  On upon arrival to the [facility] emergency department the patient was in A. fib with RVR however spontaneously converted back to sinus rhythm without intervention.  At this time, the patient denies any shortness of breath or chest pain.  Patient denies any claudication-like symptoms, rest pain or ulcer formation to the bilateral lower extremity.  Patient denies any weakness to the bilateral legs.  Patient denies any fever, nausea or vomiting.  During her work-up in the emergency department the patient underwent a CT Angio of the chest on November 10, 2018 which was notable for "No pulmonary embolus. Aortic atherosclerosis with calcified and noncalcified plaque. Small ulcerated plaque in the distal thoracic aorta. Aortic Atherosclerosis (ICD10-I70.0). Coronary artery calcifications."  Vascular surgery was consulted by Dr. Jodell Cipro for further recommendations in regard to the small ulcerated plaque noted in the distal thoracic aorta.  Current Facility-Administered Medications  Medication Dose Route Frequency Provider Last Rate Last Dose  . acetaminophen (TYLENOL) tablet 650 mg  650 mg Oral Q4H  PRN Arta Silence, MD      . bisacodyl (DULCOLAX) EC tablet 5 mg  5 mg Oral Daily PRN Arta Silence, MD      . diltiazem (CARDIZEM CD) 24 hr capsule 180 mg  180 mg Oral QPM Sridharan, Prasanna, MD      . diltiazem (CARDIZEM) tablet 120 mg  120 mg Oral Daily Arta Silence, MD   120 mg at 11/10/18 1116  . escitalopram (LEXAPRO) tablet 5 mg  5 mg Oral QHS Sridharan, Prasanna, MD      . guanFACINE (TENEX) tablet 1 mg  1 mg Oral BID Arta Silence, MD   1 mg at 11/10/18 1118  . insulin aspart (novoLOG) injection 0-15 Units  0-15 Units Subcutaneous TID WC Arta Silence, MD   3 Units at 11/10/18 1240  . insulin aspart (novoLOG) injection 0-5 Units  0-5 Units Subcutaneous QHS Jodell Cipro, Prasanna, MD      . lactated ringers infusion   Intravenous Continuous Jodell Cipro, Prasanna, MD      . lactated ringers with KCl 20 mEq/L infusion   Intravenous Continuous Arta Silence, MD 100 mL/hr at 11/10/18 0725    . losartan (COZAAR) tablet 100 mg  100 mg Oral Daily Arta Silence, MD   100 mg at 11/10/18 1117  . magnesium oxide (MAG-OX) tablet 400 mg  400 mg Oral BID Arta Silence, MD   400 mg at 11/10/18 1116  . morphine 2 MG/ML injection 2 mg  2 mg Intravenous Q3H PRN Arta Silence, MD       Or  . morphine 4 MG/ML injection 4 mg  4 mg Intravenous Q3H PRN Arta Silence, MD      . ondansetron (ZOFRAN) injection 4 mg  4  mg Intravenous Q6H PRN Arta Silence, MD      . pantoprazole (PROTONIX) EC tablet 40 mg  40 mg Oral Daily Arta Silence, MD   40 mg at 11/10/18 1117  . rivaroxaban (XARELTO) tablet 20 mg  20 mg Oral Q supper Arta Silence, MD      . rosuvastatin (CRESTOR) tablet 5 mg  5 mg Oral Once per day on Mon Thu Sridharan, Prasanna, MD   5 mg at 11/10/18 1117  . senna-docusate (Senokot-S) tablet 1 tablet  1 tablet Oral QHS PRN Arta Silence, MD      . valACYclovir (VALTREX) tablet 1,000 mg  1,000 mg Oral BID Arta Silence, MD        Past Medical History:  Diagnosis Date  . A-fib ([place])   . Depression   . Diabetes mellitus without complication ([place])   . GERD (gastroesophageal reflux disease)   . Hypercholesteremia   . Hypertension    Past Surgical History:  Procedure Laterality Date  . CYSTOSCOPY     Social History Social History   Tobacco Use  . Smoking status: Never Smoker  . Smokeless tobacco: Never Used  Substance Use Topics  . Alcohol use: No  . Drug use: No   Family History Family History  Problem Relation Age of Onset  . CVA Mother   . CVA Father   Denies family history of peripheral artery disease, venous disease or bleeding/clotting disorders.  Allergies  Allergen Reactions  . Keflex [Cephalexin] Hives  . Hydrochlorothiazide W-Triamterene Nausea Only  . Lisinopril Other (See Comments)    Unknown    . Penicillins Hives  . Statins Other (See Comments)    Unknown    REVIEW OF SYSTEMS (Negative unless checked)  Constitutional: [] Weight loss  [] Fever  [] Chills Cardiac: [x] Chest pain   [x] Chest pressure   [x] Palpitations   [] Shortness of breath when laying flat   [] Shortness of breath at rest   [] Shortness of breath with exertion. Vascular:  [] Pain in legs with walking   [] Pain in legs at rest   [] Pain in legs when laying flat   [] Claudication   [] Pain in feet when walking  [] Pain in feet at rest  [] Pain in feet when laying flat   [] History of DVT   [] Phlebitis   [] Swelling in legs   [] Varicose veins   [] Non-healing ulcers Pulmonary:   [] Uses home oxygen   [] Productive cough   [] Hemoptysis   [] Wheeze  [] COPD   [] Asthma Neurologic:  [] Dizziness  [] Blackouts   [] Seizures   [] History of stroke   [] History of TIA  [] Aphasia   [] Temporary blindness   [] Dysphagia   [] Weakness or numbness in arms   [x] Weakness or numbness in legs Musculoskeletal:  [] Arthritis   [] Joint swelling   [] Joint pain   [] Low back pain Hematologic:  [] Easy bruising  [] Easy bleeding   [] Hypercoagulable state   [] Anemic   [] Hepatitis Gastrointestinal:  [] Blood in stool   [] Vomiting blood  [] Gastroesophageal reflux/heartburn   [] Difficulty swallowing. Genitourinary:  [] Chronic kidney disease   [] Difficult urination  [] Frequent urination  [] Burning with urination   [] Blood in urine Skin:  [] Rashes   [] Ulcers   [] Wounds Psychological:  [x] History of anxiety   []  History of major depression.  Physical Examination  Vitals:   11/10/18 0700 11/10/18 0730 11/10/18 0800 11/10/18 0843  BP: (!) 143/86 (!) 144/87 (!) 148/79 (!) 150/81  Pulse: 85 84 83 86  Resp: 15 (!) 23  18  Temp:    98.4  F (36.9 C)  TempSrc:    Oral  SpO2: 98% 98% 97% 97%  Weight:    60.1 kg  Height:    4\' 9"  (1.448 m)   Body mass index is 28.69 kg/m. Gen:  WD/WN, NAD Head: [place]/AT, No temporalis wasting. Prominent temp pulse not noted. Ear/Nose/Throat: Hearing grossly intact, nares w/o erythema or drainage, oropharynx w/o Erythema/Exudate Eyes: Sclera non-icteric, conjunctiva clear Neck: Trachea midline.  No JVD.  Pulmonary:  Good air movement, respirations not labored, equal bilaterally.  Cardiac: RRR, normal S1, S2. Vascular:  Vessel Right Left  Radial Palpable Palpable  Ulnar Palpable Palpable  Brachial Palpable Palpable  Carotid Palpable, without bruit Palpable, without bruit  Aorta Not palpable N/A  Femoral Palpable Palpable  Popliteal Palpable Palpable  PT Palpable Palpable  DP Palpable Palpable   Gastrointestinal: soft, non-tender/non-distended. No guarding/reflex.  Musculoskeletal: M/S 5/5 throughout.  Extremities without ischemic changes.  No deformity or atrophy. No edema. Neurologic: Sensation grossly intact in extremities.  Symmetrical.  Speech is fluent. Motor exam as listed above. Psychiatric: Judgment intact, Mood & affect appropriate for pt's clinical situation. Dermatologic: No rashes or ulcers noted.  No cellulitis or open wounds. Lymph : No Cervical, Axillary, or Inguinal lymphadenopathy.  CBC Lab Results   Component Value Date   WBC 7.7 11/10/2018   HGB 15.0 11/10/2018   HCT 46.6 (H) 11/10/2018   MCV 87.6 11/10/2018   PLT 288 11/10/2018   BMET    Component Value Date/Time   NA 140 11/10/2018 0431   NA 141 09/27/2013 0015   K 3.5 11/10/2018 0431   K 3.5 09/27/2013 0015   CL 103 11/10/2018 0431   CL 106 09/27/2013 0015   CO2 25 11/10/2018 0431   CO2 28 09/27/2013 0015   GLUCOSE 221 (H) 11/10/2018 0431   GLUCOSE 171 (H) 09/27/2013 0015   BUN 22 11/10/2018 0431   BUN 12 09/27/2013 0015   CREATININE 0.84 11/10/2018 0431   CREATININE 0.93 09/27/2013 0015   CALCIUM 9.3 11/10/2018 0431   CALCIUM 8.5 09/27/2013 0015   GFRNONAA >60 11/10/2018 0431   GFRNONAA >60 09/27/2013 0015   GFRAA >60 11/10/2018 0431   GFRAA >60 09/27/2013 0015   Estimated Creatinine Clearance: 41.1 mL/min (by C-G formula based on SCr of 0.84 mg/dL).  COAG No results found for: INR, PROTIME  Radiology Dg Chest 2 View  Result Date: 11/10/2018 CLINICAL DATA:  Chest pain. EXAM: CHEST - 2 VIEW COMPARISON:  Radiograph 09/30/2015 FINDINGS: The cardiomediastinal contours are normal. Aortic atherosclerosis. Pulmonary vasculature is normal. No consolidation, pleural effusion, or pneumothorax. No acute osseous abnormalities are seen. IMPRESSION: 1. No acute findings. 2.  Aortic Atherosclerosis (ICD10-I70.0). Electronically Signed   By: Keith Rake M.D.   On: 11/10/2018 05:15   Ct Angio Chest Pe W/cm &/or Wo Cm  Result Date: 11/10/2018 CLINICAL DATA:  PE suspected, high pretest prob. Chest pain and racing heart. EXAM: CT ANGIOGRAPHY CHEST WITH CONTRAST TECHNIQUE: Multidetector CT imaging of the chest was performed using the standard protocol during bolus administration of intravenous contrast. Multiplanar CT image reconstructions and MIPs were obtained to evaluate the vascular anatomy. CONTRAST:  67mL OMNIPAQUE IOHEXOL 350 MG/ML SOLN COMPARISON:  Radiograph earlier this day. FINDINGS: Cardiovascular: There are no  filling defects within the pulmonary arteries to suggest pulmonary embolus. Thoracic aorta is normal in caliber without dissection. There is calcified and noncalcified atheromatous plaque primarily of the arch and descending aorta. Focal ulcerated plaque of the distal thoracic aorta just proximal  to the diaphragmatic hiatus without periaortic stranding. No aortic dissection. There are coronary artery calcifications. Mediastinum/Nodes: Multiple small mediastinal nodes not enlarged by size criteria. No hilar adenopathy. No visualized thyroid nodule. The esophagus is nondistended. Lungs/Pleura: Dependent hypoventilatory changes in the bases. No confluent consolidation. Small subpleural blebs in the right lower lobe. Scattered subpleural atelectasis or scarring in the right middle lobe. Calcified right middle lobe granuloma. No pulmonary mass. The trachea and mainstem bronchi are patent. Upper Abdomen: No acute findings. Atherosclerosis of upper abdominal vasculature. Musculoskeletal: Bone island within L1, partially included. There are no acute or suspicious osseous abnormalities. Review of the MIP images confirms the above findings. IMPRESSION: 1. No pulmonary embolus. 2. Aortic atherosclerosis with calcified and noncalcified plaque. Small ulcerated plaque in the distal thoracic aorta. Aortic Atherosclerosis (ICD10-I70.0). 3. Coronary artery calcifications. Electronically Signed   By: Keith Rake M.D.   On: 11/10/2018 05:48   Dg Bone Density (dxa)  Result Date: 11/04/2018 EXAM: DUAL X-RAY ABSORPTIOMETRY (DXA) FOR BONE MINERAL DENSITY IMPRESSION: crr Your patient Girl Schissler completed a BMD test on 11/04/2018 using the [place] (analysis version: 14.10) manufactured by [employer]. The following summarizes the results of our evaluation. PATIENT BIOGRAPHICAL: Name: Lenny, Bouchillon Patient ID: [id number] Birth Date: November 23, 1941 Height: 58.0 in. Gender: Female Exam Date: 11/04/2018 Weight:  140.4 lbs. Indications: Advanced Age, Caucasian, diabetic, Height Loss, Low Body Weight, POSTmenopausal, skin ca Fractures: Treatments: metformin, Vitamin D, XARELTO ASSESSMENT: The BMD measured at Forearm Radius 33% is 0.711 g/cm2 with a T-score of -1.9. This patient is considered osteopenic according to [facility]) criteria.scan quality is good. Site Region Measured Measured WHO Young Adult BMD Date       Age      Classification T-score AP Spine L1-L4 11/04/[age] 78.7 Normal -0.4 1.129 g/cm2 DualFemur Neck Left 11/04/2018 78.7 Osteopenia -1.8 0.789 g/cm2 Right Forearm Radius 33% 11/04/2018 78.7 Osteopenia -1.9 0.711 g/cm2 World Health Organization [facility]) criteria for post-menopausal, Caucasian Women: Normal:       T-score at or above -1 SD Osteopenia:   T-score between -1 and -2.5 SD Osteoporosis: T-score at or below -2.5 SD RECOMMENDATIONS: 1. All patients should optimize calcium and vitamin D intake. 2. Consider FDA-approved medical therapies in postmenopausal women and men aged 47 years and older, based on the following: a. A hip or vertebral (clinical or morphometric) fracture b. T-score < -2.5 at the femoral neck or spine after appropriate evaluation to exclude secondary causes c. Low bone mass (T-score between -1.0 and -2.5 at the femoral neck or spine) and a 10-year probability of a hip fracture > 3% or a 10-year probability of a major osteoporosis-related fracture > 20% based on the [place]-adapted WHO algorithm d. Clinician judgment and/or patient preferences may indicate treatment for people with 10-year fracture probabilities above or below these levels FOLLOW-UP: Patients with diagnosed cases of osteoporosis or at high risk for fracture should have regular bone mineral density tests. For patients eligible for Medicare, routine testing is allowed once every 2 years. The testing frequency can be increased to one year for patients who have rapidly progressing disease, those who are receiving  or discontinuing medical therapy to restore bone mass, or have additional risk factors. I have reviewed this report, and agree with the above findings. [facility] Radiology crr Your patient MAKAIYA GEERDES completed a FRAX assessment on 11/04/2018 using the [place] (analysis version: 14.10) manufactured by [employer]. The following summarizes the results  of our evaluation. PATIENT BIOGRAPHICAL: Name: My, Rinke Patient ID: [id number] Birth Date: 1941/11/26 Height:    58.0 in. Gender:     [age]    [age]       Weight:    140.4 lbs. Ethnicity:  White                            Exam Date: 11/04/2018 FRAX* RESULTS:  (version: 3.5) 10-year Probability of Fracture1 Major Osteoporotic Fracture2 Hip Fracture 13.9% 3.6% Population: [place] (Caucasian) Risk Factors: None Based on Femur (Left) Neck BMD 1 -The 10-year probability of fracture may be lower than reported if the patient has received treatment. 2 -Major Osteoporotic Fracture: Clinical Spine, Forearm, Hip or Shoulder *FRAX is a Materials engineer of the [employer] for Metabolic Bone Disease, a [place] (WHO) [facility]. ASSESSMENT: The probability of a major osteoporotic fracture is 13.9% within the next ten years. The probability of a hip fracture is 3.6% within the next ten years. Electronically Signed   By: Rolm Baptise M.D.   On: 11/04/2018 10:58   Assessment/Plan The patient is a 77 year old female with a past medical history of atrial fibrillation, hypertension, sleep apnea, type 2 diabetes, GERD, hypercholesteremia, generalized anxiety disorder who presented to [facility] emergency department with chest pain found to be in A. fib with RVR and spontaneously converted - stable 1. 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 Lucky Cowboy.  At this time, there is no indication for endovascular/open vascular intervention.  Would  recommend antiplatelet therapy with ASA.  The patient states that she is on aspirin and Xarelto.  This is more than appropriate for medical management.  Recommended follow-up with a repeat CTA as an outpatient in our office in one month.  I will place an order for a CTA of the chest.  The patient understands that she is to make an appointment with our office to see Dr. Lucky Cowboy to review her CTA after she under goes her repeat CTA.  Both the patient and her daughter who was present are in agreement. 2. Aortic Atherosclerosis: Patient is currently on aspirin and statin.  Asymptomatic at this time. 3. CAD: This is followed by her cardiologist  Discussed with Dr. Mayme Genta, PA-C  11/10/2018 2:48 PM  This note was created with Dragon medical transcription system.  Any error is purely unintentional.

## 2018-11-10 NOTE — ED Provider Notes (Signed)
[facility] Emergency Department Provider Note  ____________________________________________   None    (approximate)  I have reviewed the triage vital signs and the nursing notes.   HISTORY  Chief Complaint Palpitations   HPI Vanessa Young is a 77 y.o. female comes to the emergency department via EMS with palpitations that woke her up acutely from her sleep at 2:30 AM.  She has a past medical history of paroxysmal atrial fibrillation.  Prior to calling EMS the patient gave herself 30 mg of diltiazem.  EMS noted she was in atrial fibrillation to about 110 or 120 beats in route.  Patient does take rivaroxaban.  She denies chest pain or shortness of breath.  Her symptoms came on suddenly were moderate to severe and she described it as "palpitations and uncomfortable".  Her symptoms are somewhat worse with exertion somewhat improved with rest.  Non-positional.  No history of heart attack.    Past Medical History:  Diagnosis Date  . A-fib ([place])   . Depression   . Diabetes mellitus without complication ([place])   . GERD (gastroesophageal reflux disease)   . Hypercholesteremia   . Hypertension     Patient Active Problem List   Diagnosis Date Noted  . Paroxysmal atrial fibrillation with rapid ventricular response ([place]) 11/10/2018  . SCC (squamous cell carcinoma), scalp/neck 03/23/2016  . 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 Atrial fibrillation ([place]) 01/20/2014  . 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 Osteopenia 01/19/2013  . OA (osteoarthritis) of knee 11/23/2011  . Type 2 diabetes mellitus with microalbuminuria, without long-term current use of insulin ([place]) 09/28/2011  . Type 2 diabetes mellitus with diabetic polyneuropathy, without long-term current use of insulin ([place]) 09/28/2011  . GERD (gastroesophageal reflux disease) 09/28/2011  . Benign essential hypertension 06/21/2011  . Hypercholesterolemia 06/21/2011  . Generalized anxiety disorder 06/21/2011      Past Surgical History:  Procedure Laterality Date  . CYSTOSCOPY      Prior to Admission medications   Medication Sig Start Date End Date Taking? Authorizing Provider  azithromycin (ZITHROMAX) 500 MG tablet 1 tablet daily for 5 days Patient not taking: Reported on 05/30/2017 01/05/16   Frederich Cha, MD  benzonatate (TESSALON PERLES) 100 MG capsule Take 1 capsule (100 mg total) by mouth 3 (three) times daily as needed. Patient not taking: Reported on 05/30/2017 03/13/17   Marylene Land, NP  diltiazem (CARDIZEM) 120 MG tablet Take 120 mg by mouth daily.     [provider]  diltiazem (DILACOR XR) 180 MG 24 hr capsule Take 180 mg by mouth every evening.    [provider]  escitalopram (LEXAPRO) 5 MG tablet Take 5 mg by mouth at bedtime.     [provider]  fexofenadine-pseudoephedrine (ALLEGRA-D ALLERGY & CONGESTION) 180-240 MG 24 hr tablet Take 1 tablet by mouth daily. Patient not taking: Reported on 05/30/2017 01/05/16   Frederich Cha, MD  guanFACINE (TENEX) 1 MG tablet Take 1 mg by mouth 2 (two) times daily.     [provider]  losartan (COZAAR) 100 MG tablet Take 100 mg by mouth daily.    [provider]  magnesium oxide (MAG-OX) 400 MG tablet Take 400 mg by mouth 2 (two) times daily.     [provider]  metFORMIN (GLUMETZA) 500 MG (MOD) 24 hr tablet Take 500 mg by mouth daily with breakfast.    [provider]  omeprazole (PRILOSEC) 20 MG capsule Take 20 mg by mouth daily.  [provider]  oseltamivir (TAMIFLU) 75 MG capsule Take 1 capsule (75 mg total) by mouth every 12 (twelve) hours. Patient not taking: Reported on 05/30/2017 03/13/17   Marylene Land, NP  rivaroxaban (XARELTO) 20 MG TABS tablet Take 20 mg by mouth daily with supper.    [provider]  rosuvastatin (CRESTOR) 5 MG tablet Take 5 mg by mouth daily. Daily on Monday and Thursday    [provider]  traMADol (ULTRAM) 50 MG tablet  Take 1 tablet (50 mg total) by mouth every 6 (six) hours as needed. Patient not taking: Reported on 05/30/2017 03/10/16   Cuthriell, Charline Bills, PA-C  valACYclovir (VALTREX) 1000 MG tablet Take 1 tablet (1,000 mg total) by mouth 2 (two) times daily. 09/15/17   Frederich Cha, MD    Allergies Keflex [cephalexin]; Hydrochlorothiazide w-triamterene; Lisinopril; Penicillins; and Statins  Family History  Problem Relation Age of Onset  . CVA Mother   . CVA Father     Social History Social History   Tobacco Use  . Smoking status: Never Smoker  . Smokeless tobacco: Never Used  Substance Use Topics  . Alcohol use: No  . Drug use: No    Review of Systems Constitutional: No fever/chills Eyes: No visual changes. ENT: No sore throat. Cardiovascular: Denies chest pain. Respiratory: Denies shortness of breath. Gastrointestinal: No abdominal pain.  No nausea, no vomiting.  No diarrhea.  No constipation. Genitourinary: Negative for dysuria. Musculoskeletal: Negative for back pain. Skin: Negative for rash. Neurological: Negative for headaches, focal weakness or numbness.   ____________________________________________   PHYSICAL EXAM:  VITAL SIGNS: ED Triage Vitals [11/10/18 0425]  Enc Vitals Group     BP      Pulse      Resp      Temp      Temp src      SpO2      Weight      Height      Head Circumference      Peak Flow      Pain Score 0     Pain Loc      Pain Edu?      Excl. in [place]?     Constitutional: Alert and oriented x4 appears somewhat uncomfortable although nontoxic no diaphoresis and speaks in full clear sentences Eyes: PERRL EOMI. Head: Atraumatic. Nose: No congestion/rhinnorhea. Mouth/Throat: No trismus Neck: No stridor.  Able to lie completely flat with no JVD Cardiovascular: Normal rate, regular rhythm. Grossly normal heart sounds.  Good peripheral circulation. Respiratory: Normal respiratory effort.  No retractions. Lungs CTAB and moving good  air Gastrointestinal: Soft nontender Musculoskeletal: No lower extremity edema   Neurologic:  Normal speech and language. No gross focal neurologic deficits are appreciated. Skin:  Skin is warm, dry and intact. No rash noted. Psychiatric: Mood and affect are normal. Speech and behavior are normal.    ____________________________________________   DIFFERENTIAL includes but not limited to  Atrial fibrillation, dehydration, pulmonary embolism, COPD, pneumonia, pneumothorax ____________________________________________   LABS (all labs ordered are listed, but only abnormal results are displayed)  Labs Reviewed  BASIC METABOLIC PANEL - Abnormal; Notable for the following components:      Result Value   Glucose, Bld 221 (*)    All other components within normal limits  CBC - Abnormal; Notable for the following components:   RBC 5.32 (*)    HCT 46.6 (*)    All other components within normal limits  MAGNESIUM - Abnormal; Notable for the  following components:   Magnesium 1.6 (*)    All other components within normal limits  TROPONIN I  HEPATIC FUNCTION PANEL  BRAIN NATRIURETIC PEPTIDE  PHOSPHORUS    Lab work reviewed by me with slightly elevated sugar but no signs of DKA. __________________________________________  EKG  ED ECG REPORT I, Darel Hong, the attending physician, personally viewed and interpreted this ECG.  Date: 11/10/2018 EKG Time:  Rate: 88 Rhythm: normal sinus rhythm QRS Axis: Leftward Intervals: normal ST/T Wave abnormalities: normal Narrative Interpretation: no evidence of acute ischemia    ____________________________________________  RADIOLOGY  Chest x-ray reviewed by me with no acute disease CT angiogram of the chest reviewed by me with no pulmonary embolism but is concerning for a posterior aortic ulceration ____________________________________________   PROCEDURES  Procedure(s) performed: no  Procedures  Critical Care performed:  no  ____________________________________________   INITIAL IMPRESSION / ASSESSMENT AND PLAN / ED COURSE  Pertinent labs & imaging results that were available during my care of the patient were reviewed by me and considered in my medical decision making (see chart for details).   As part of my medical decision making, I reviewed the following data within the [place] History obtained from family if available, nursing notes, old chart and ekg, as well as notes from prior ED visits.  The patient comes to the emergency department initially in atrial fibrillation with low-grade rapid ventricular response but by the time we got her situated in the bed she was back to normal sinus rhythm.  Her lungs are clear and she appears well however she inexplicably is hypoxic to 92% on room air.  She has no known history of pulmonary embolism or DVT although she is anticoagulated for atrial fibrillation.  No recent illness and her lungs are clear.  Given the diagnostic uncertainty however I do think she requires a CT scan of her chest.  The patient CT angiogram is negative for clot but is concerning for posterior aortic ulceration.  I discussed with on-call vascular surgeon Dr. Juanda Crumble who recommends inpatient admission with inpatient vascular consultation.  I got the patient up and ambulated her around the department and her heart rate went up to about 130.  It did come down when she sat down again.  She is likely dehydrated.  I discussed with the hospitalist Dr. Oren Bracket and we both agree the patient warrants inpatient admission for continued telemetry monitoring, echocardiogram, and vascular consultation.  Her hypoxia is likely secondary to pulmonary hypertension.  The patient and her family verbalized understanding and agreement with the plan.      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     Darel Hong, MD 11/10/18 [phone]

## 2018-11-11 DIAGNOSIS — I4891 Unspecified atrial fibrillation: Secondary | ICD-10-CM | POA: Diagnosis not present

## 2018-11-11 DIAGNOSIS — I4892 Unspecified atrial flutter: Secondary | ICD-10-CM | POA: Diagnosis not present

## 2018-11-11 LAB — CBC
HCT: 42.1 % (ref 36.0–46.0)
Hemoglobin: 13.4 g/dL (ref 12.0–15.0)
MCH: 28.4 pg (ref 26.0–34.0)
MCHC: 31.8 g/dL (ref 30.0–36.0)
MCV: 89.2 fL (ref 80.0–100.0)
NRBC: 0 % (ref 0.0–0.2)
PLATELETS: 278 10*3/uL (ref 150–400)
RBC: 4.72 MIL/uL (ref 3.87–5.11)
RDW: 13.5 % (ref 11.5–15.5)
WBC: 6.9 10*3/uL (ref 4.0–10.5)

## 2018-11-11 LAB — BASIC METABOLIC PANEL
ANION GAP: 8 (ref 5–15)
BUN: 16 mg/dL (ref 8–23)
CO2: 29 mmol/L (ref 22–32)
Calcium: 9.2 mg/dL (ref 8.9–10.3)
Chloride: 104 mmol/L (ref 98–111)
Creatinine, Ser: 0.75 mg/dL (ref 0.44–1.00)
GFR calc Af Amer: >60 mL/min (ref >60)
GLUCOSE: 191 mg/dL — AB (ref 70–99)
Potassium: 4.2 mmol/L (ref 3.5–5.1)
Sodium: 141 mmol/L (ref 135–145)

## 2018-11-11 LAB — GLUCOSE, CAPILLARY: Glucose-Capillary: 182 mg/dL — ABNORMAL HIGH (ref 70–99)

## 2018-11-11 MED ORDER — ASPIRIN 81 MG PO TBEC: 81.0000 mg | DELAYED_RELEASE_TABLET | Freq: Every day | ORAL | 0 refills | Status: DC

## 2018-11-11 NOTE — Discharge Instructions (Signed)
It was so nice to meet you during this hospitalization!  You came into the hospital because you were having a fast irregular heart rate. Your heart went back into a normal rhythm with the cardizem. The heart doctor recommended that you stay on the same dose of the cardizem.  You had a cat scan of your chest, which showed a small plaque in your thoracic aorta. The vascular surgeon saw you and recommended that you be placed on aspirin 81mg  daily. They have also ordered a repeat cat scan of your chest. The radiology department will call you to get this scheduled. You should then call Dr. Bunnie Domino office to schedule an appointment after the cat scan has been done.  The vascular surgeon may decide to take you off the aspirin, but I would recommend continuing this medication until you see them.  Take care, Dr. Brett Albino

## 2018-11-11 NOTE — Progress Notes (Signed)
[facility] Cardiology  SUBJECTIVE: Vanessa Young is a 77 year old female with a past medical history significant for paroxsymal atrial fibrillation, on Xarelto, essential hypertension, hypercholesterolemia, type 2 diabetes, and obstructive sleep apnea who presented to the ED on 11/10/18 with palpitations.  EMS noted patient to be in atrial fibrillation with RVR.  Converted back into sinus rhythm prior to arriving to the ED with diltiazem 30mg , had one recurrence of atrial fibrillation with ambulation, but has remained in sinus rhythm since.    Today, denies recurrence of palpitations or racing heart sensation.  Denies chest pain, shortness of breath, or lower extremity swelling.  Denies dizziness or lightheadedness.    Vitals:   11/10/18 0843 11/10/18 1651 11/10/18 1936 11/11/18 0402  BP: (!) 150/81 140/71 137/67 (!) 154/83  Pulse: 86 67 72 77  Resp: 18 18 18 17   Temp: 98.4 F (36.9 C) 98.6 F (37 C) 98.3 F (36.8 C) 98.3 F (36.8 C)  TempSrc: Oral Oral Oral Oral  SpO2: 97% 99% 95% 91%  Weight: 60.1 kg     Height: 4\' 9"  (1.448 m)        Intake/Output Summary (Last 24 hours) at 11/11/2018 0940 Last data filed at 11/11/2018 0400 Gross per 24 hour  Intake 500.61 ml  Output 1500 ml  Net -999.39 ml      PHYSICAL EXAM  General: Well developed, well nourished, sitting up in bed in no acute distress HEENT:  Normocephalic and atramatic Neck:  No JVD.  Lungs: Clear bilaterally to auscultation and percussion. Heart: HRRR . Normal S1 and S2 without gallops or murmurs.  Abdomen: Bowel sounds are positive, abdomen soft and non-tender  Msk:  Back normal, normal gait. Normal strength and tone for age. Extremities: No clubbing, cyanosis or edema.   Neuro: Alert and oriented X 3. Psych:  Good affect, responds appropriately   LABS: Basic Metabolic Panel: Recent Labs    11/10/18 0431 11/11/18 0437  NA 140 141  K 3.5 4.2  CL 103 104  CO2 25 29  GLUCOSE 221* 191*  BUN 22 16  CREATININE 0.84  0.75  CALCIUM 9.3 9.2  MG 1.6*  --   PHOS 3.3  --    Liver Function Tests: Recent Labs    11/10/18 0431  AST 28  ALT 14  ALKPHOS 100  BILITOT 0.4  PROT 7.7  ALBUMIN 4.3   No results for input(s): LIPASE, AMYLASE in the last 72 hours. CBC: Recent Labs    11/10/18 0431 11/11/18 0437  WBC 7.7 6.9  HGB 15.0 13.4  HCT 46.6* 42.1  MCV 87.6 89.2  PLT 288 278   Cardiac Enzymes: Recent Labs    11/10/18 0431  TROPONINI <0.03   BNP: Invalid input(s): POCBNP D-Dimer: No results for input(s): DDIMER in the last 72 hours. Hemoglobin A1C: No results for input(s): HGBA1C in the last 72 hours. Fasting Lipid Panel: No results for input(s): CHOL, HDL, LDLCALC, TRIG, CHOLHDL, LDLDIRECT in the last 72 hours. Thyroid Function Tests: No results for input(s): TSH, T4TOTAL, T3FREE, THYROIDAB in the last 72 hours.  Invalid input(s): FREET3 Anemia Panel: No results for input(s): VITAMINB12, FOLATE, FERRITIN, TIBC, IRON, RETICCTPCT in the last 72 hours.  Dg Chest 2 View  Result Date: 11/10/2018 CLINICAL DATA:  Chest pain. EXAM: CHEST - 2 VIEW COMPARISON:  Radiograph 09/30/2015 FINDINGS: The cardiomediastinal contours are normal. Aortic atherosclerosis. Pulmonary vasculature is normal. No consolidation, pleural effusion, or pneumothorax. No acute osseous abnormalities are seen. IMPRESSION: 1. No acute  findings. 2.  Aortic Atherosclerosis (ICD10-I70.0). Electronically Signed   By: Keith Rake M.D.   On: 11/10/2018 05:15   Ct Angio Chest Pe W/cm &/or Wo Cm  Result Date: 11/10/2018 CLINICAL DATA:  PE suspected, high pretest prob. Chest pain and racing heart. EXAM: CT ANGIOGRAPHY CHEST WITH CONTRAST TECHNIQUE: Multidetector CT imaging of the chest was performed using the standard protocol during bolus administration of intravenous contrast. Multiplanar CT image reconstructions and MIPs were obtained to evaluate the vascular anatomy. CONTRAST:  27mL OMNIPAQUE IOHEXOL 350 MG/ML SOLN  COMPARISON:  Radiograph earlier this day. FINDINGS: Cardiovascular: There are no filling defects within the pulmonary arteries to suggest pulmonary embolus. Thoracic aorta is normal in caliber without dissection. There is calcified and noncalcified atheromatous plaque primarily of the arch and descending aorta. Focal ulcerated plaque of the distal thoracic aorta just proximal to the diaphragmatic hiatus without periaortic stranding. No aortic dissection. There are coronary artery calcifications. Mediastinum/Nodes: Multiple small mediastinal nodes not enlarged by size criteria. No hilar adenopathy. No visualized thyroid nodule. The esophagus is nondistended. Lungs/Pleura: Dependent hypoventilatory changes in the bases. No confluent consolidation. Small subpleural blebs in the right lower lobe. Scattered subpleural atelectasis or scarring in the right middle lobe. Calcified right middle lobe granuloma. No pulmonary mass. The trachea and mainstem bronchi are patent. Upper Abdomen: No acute findings. Atherosclerosis of upper abdominal vasculature. Musculoskeletal: Bone island within L1, partially included. There are no acute or suspicious osseous abnormalities. Review of the MIP images confirms the above findings. IMPRESSION: 1. No pulmonary embolus. 2. Aortic atherosclerosis with calcified and noncalcified plaque. Small ulcerated plaque in the distal thoracic aorta. Aortic Atherosclerosis (ICD10-I70.0). 3. Coronary artery calcifications. Electronically Signed   By: Keith Rake M.D.   On: 11/10/2018 05:48     TELEMETRY: Normal sinus rhythm   ASSESSMENT AND PLAN:  Active Problems:   Paroxysmal atrial fibrillation with rapid ventricular response ([place])    1.  Atrial fibrillation              -Has remained in sinus rhythm with controlled rate; okay to discharge from cardiology standpoint             -Will continue with home medication regimen of diltiazem 120mg  in the morning and 180mg  in the evening              -Continue with Xarelto              -Follow up outpatient cardiology  2.  Hypertension             -Continue current home medication regimen of losartan, diltiazem, and furosemide  3.  Obstructive sleep apnea             -Continue nightly compliance with CPAP  4.  Type 2 diabetes             -Continue current medications  5.  Hypercholesterolemia              -Continue rosuvastatin 5mg     Avie Arenas  PA-C 11/11/2018 9:40 AM

## 2018-11-11 NOTE — Discharge Summary (Signed)
[facility] NAME: Vanessa Young    MR#:  [id number]  DATE OF BIRTH:  29-Jul-1941  DATE OF ADMISSION:  11/10/2018   ADMITTING PHYSICIAN: Arta Silence, MD  DATE OF DISCHARGE: 11/11/18  PRIMARY CARE PHYSICIAN: [facility]   ADMISSION DIAGNOSIS:  Dehydration [E86.0] Hypoxia [R09.02] Atrial fibrillation with rapid ventricular response ([place]) [I48.91] Atherosclerotic ulcer of aorta ([facility]) [I70.0] DISCHARGE DIAGNOSIS:  Active Problems:   Paroxysmal atrial fibrillation with rapid ventricular response ([place])  SECONDARY DIAGNOSIS:   Past Medical History:  Diagnosis Date  . A-fib ([place])   . Depression   . Diabetes mellitus without complication ([place])   . GERD (gastroesophageal reflux disease)   . Hypercholesteremia   . Hypertension    HOSPITAL COURSE:   Vanessa Young is a 77 year old female who presented to the ED with palpitations and burning chest pain. She took a cardizem tablet at home. On arrival to the ED, she was noted to be in A-fib with RVR. She converted to sinus rhythm without intervention. She was admitted for further management.  Paroxysmal atrial fibrillation- initially in RVR, but converted to NSR. HRs normal at the time of discharge.  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 Keflex [Cephalexin] Hives  . Hydrochlorothiazide W-Triamterene Nausea Only  . Lisinopril Other (See Comments)    Unknown    . Penicillins Hives  . Statins Other (See Comments)    Unknown    DISCHARGE MEDICATIONS:   Allergies as of 11/11/2018      Reactions   Keflex [cephalexin] Hives   Hydrochlorothiazide W-triamterene Nausea Only   Lisinopril Other (See Comments)   Unknown    Penicillins Hives   Statins Other (See Comments)   Unknown       Medication List    STOP taking these medications   valACYclovir 1000 MG tablet Commonly known as:  VALTREX     TAKE these medications   allopurinol 100 MG tablet Commonly known as:  ZYLOPRIM Take 100 mg by mouth.   aspirin 81 MG EC tablet Take 1 tablet (81 mg total) by mouth daily. Start taking on:  11/12/2018   diltiazem 120 MG tablet Commonly known as:  CARDIZEM Take 120 mg by mouth daily. Patient takes 120mg  tablet in the am and 180mg  in the evening   diltiazem 180 MG 24 hr capsule Commonly known as:  DILACOR XR Take 180 mg by mouth every evening.   escitalopram 5 MG tablet Commonly known as:  LEXAPRO Take 5 mg by mouth at bedtime.   furosemide 20 MG tablet Commonly known as:  LASIX Take 20 mg by mouth.   guanFACINE 1 MG tablet Commonly known as:  TENEX Take 1  mg by mouth 2 (two) times daily.   losartan 100 MG tablet Commonly known as:  COZAAR Take 100 mg by mouth daily.   magnesium oxide 400 MG tablet Commonly known as:  MAG-OX Take 400 mg by mouth daily.   metFORMIN 500 MG (MOD) 24 hr tablet Commonly known as:  GLUMETZA Take 500 mg by mouth daily with breakfast.   omeprazole 20 MG capsule Commonly known as:  PRILOSEC Take 20 mg  by mouth daily.   rivaroxaban 20 MG Tabs tablet Commonly known as:  XARELTO Take 20 mg by mouth daily with supper.   rosuvastatin 5 MG tablet Commonly known as:  CRESTOR Take 5 mg by mouth daily. Daily on Monday and Thursday        DISCHARGE INSTRUCTIONS:  1. F/u with PCP in 1-2 weeks 2. F/u with cardiology in 2 weeks 3. Vascular recommended repeat CTA chest in 1 month 4. F/u with vascular after CTA chest performed 5. Started on aspirin per vascular recommendations. Can consider stopping as an outpatient, as patient is on xarelto. DIET:  Cardiac diet DISCHARGE CONDITION:  Stable ACTIVITY:  Activity as tolerated OXYGEN:  Home Oxygen: No.  Oxygen Delivery: room air DISCHARGE LOCATION:  home   If you experience worsening of your admission symptoms, develop shortness of breath, life threatening emergency, suicidal or homicidal thoughts you must seek medical attention immediately by calling 911 or calling your MD immediately  if symptoms less severe.  You Must read complete instructions/literature along with all the possible adverse reactions/side effects for all the Medicines you take and that have been prescribed to you. Take any new Medicines after you have completely understood and accpet all the possible adverse reactions/side effects.   Please note  You were cared for by a hospitalist during your hospital stay. If you have any questions about your discharge medications or the care you received while you were in the hospital after you are discharged, you can call the unit and asked to speak with the hospitalist on call if the hospitalist that took care of you is not available. Once you are discharged, your primary care physician will handle any further medical issues. Please note that NO REFILLS for any discharge medications will be authorized once you are discharged, as it is imperative that you return to your primary care physician (or establish a relationship with a primary  care physician if you do not have one) for your aftercare needs so that they can reassess your need for medications and monitor your lab values.    On the day of Discharge:  VITAL SIGNS:  Blood pressure (!) 154/83, pulse 77, temperature 98.3 F (36.8 C), temperature source Oral, resp. rate 17, height 4\' 9"  (1.448 m), weight 60.1 kg, SpO2 91 %. PHYSICAL EXAMINATION:  GENERAL:  77 y.o.-year-old patient lying in the bed with no acute distress.  EYES: Pupils equal, round, reactive to light and accommodation. No scleral icterus. Extraocular muscles intact.  HEENT: Head atraumatic, normocephalic. Oropharynx and nasopharynx clear.  NECK:  Supple, no jugular venous distention. No thyroid enlargement, no tenderness.  LUNGS: Normal breath sounds bilaterally, no wheezing, rales,rhonchi or crepitation. No use of accessory muscles of respiration.  CARDIOVASCULAR: S1, S2 normal. No murmurs, rubs, or gallops.  ABDOMEN: Soft, non-tender, non-distended. Bowel sounds present. No organomegaly or mass.  EXTREMITIES: No pedal edema, cyanosis, or clubbing.  NEUROLOGIC: Cranial nerves II through XII are intact. Muscle strength 5/5 in all extremities. Sensation intact. Gait not checked.  PSYCHIATRIC: The  patient is alert and oriented x 3.  SKIN: No obvious rash, lesion, or ulcer.  DATA REVIEW:   CBC Recent Labs  Lab 11/11/18 0437  WBC 6.9  HGB 13.4  HCT 42.1  PLT 278    Chemistries  Recent Labs  Lab 11/10/18 0431 11/11/18 0437  NA 140 141  K 3.5 4.2  CL 103 104  CO2 25 29  GLUCOSE 221* 191*  BUN 22 16  CREATININE 0.84 0.75  CALCIUM 9.3 9.2  MG 1.6*  --   AST 28  --   ALT 14  --   ALKPHOS 100  --   BILITOT 0.4  --      Microbiology Results  Results for orders placed or performed during the hospital encounter of 05/22/15  Urine culture     Status: None   Collection Time: 05/22/15 10:15 AM  Result Value Ref Range Status   Specimen Description URINE, CLEAN CATCH  Final   Special  Requests Normal  Final   Culture >=100,000 COLONIES/mL ESCHERICHIA COLI  Final   Report Status 05/26/2015 FINAL  Final   Organism ID, Bacteria ESCHERICHIA COLI  Final      Susceptibility   Escherichia coli - MIC*    AMPICILLIN >=32 RESISTANT Resistant     CEFTAZIDIME <=1 SENSITIVE Sensitive     CEFAZOLIN <=4 SENSITIVE Sensitive     CEFTRIAXONE <=1 SENSITIVE Sensitive     CIPROFLOXACIN >=4 RESISTANT Resistant     GENTAMICIN <=1 SENSITIVE Sensitive     IMIPENEM <=0.25 SENSITIVE Sensitive     TRIMETH/SULFA >=320 RESISTANT Resistant     CEFOXITIN <=4 SENSITIVE Sensitive     * >=100,000 COLONIES/mL ESCHERICHIA COLI  Wet prep, genital     Status: Abnormal   Collection Time: 05/22/15 10:43 AM  Result Value Ref Range Status   Yeast Wet Prep HPF POC NONE SEEN NONE SEEN Final   Trich, Wet Prep NONE SEEN NONE SEEN Final   Clue Cells Wet Prep HPF POC FEW (A) NONE SEEN Final   WBC, Wet Prep HPF POC MANY (A) NONE SEEN Final    RADIOLOGY:  No results found.   Management plans discussed with the patient, family and they are in agreement.  CODE STATUS: Full Code   TOTAL TIME TAKING CARE OF THIS PATIENT: 35 minutes.    Berna Spare  M.D on 11/11/2018 at 10:26 AM  Between 7am to 6pm - Pager - [phone]  After 6pm go to [email or web address] - Proofreader  [facility]  Office  [phone]  CC: Primary care physician; [facility]   Note: This dictation was prepared with Dragon dictation along with smaller phrase technology. Any transcriptional errors that result from this process are unintentional.

## 2018-11-11 NOTE — Plan of Care (Signed)
  Problem: Activity: Goal: Risk for activity intolerance will decrease Outcome: Progressing Note:  Up in room independently, tolerating well   Problem: Nutrition: Goal: Adequate nutrition will be maintained Outcome: Progressing   Problem: Coping: Goal: Level of anxiety will decrease Outcome: Progressing   Problem: Elimination: Goal: Will not experience complications related to urinary retention Outcome: Progressing   Problem: Pain Managment: Goal: General experience of comfort will improve Outcome: Progressing Note:  No complaints of pain this shift   Problem: Safety: Goal: Ability to remain free from injury will improve Outcome: Progressing   Problem: Skin Integrity: Goal: Risk for impaired skin integrity will decrease Outcome: Progressing   Problem: Education: Goal: Knowledge of General Education information will improve Description Including pain rating scale, medication(s)/side effects and non-pharmacologic comfort measures Outcome: Completed/Met

## 2018-11-11 NOTE — Progress Notes (Signed)
Patient alert and oriented, vss, no complaints of pain.  Ambulated around nursing station today and HR remained stable in Sinus Rhythm.

## 2018-12-04 ENCOUNTER — Ambulatory Visit
Admission: RE | Admit: 2018-12-04 | Discharge: 2018-12-04 | Disposition: A | Discharge status: Home / Self Care | Payer: Medicare Other | Source: Ambulatory Visit | Attending: Vascular Surgery | Admitting: Vascular Surgery

## 2018-12-04 DIAGNOSIS — I700 Atherosclerosis of aorta: Secondary | ICD-10-CM | POA: Diagnosis present

## 2018-12-04 MED ORDER — IOPAMIDOL (ISOVUE-370) INJECTION 76%
75.0000 mL | Freq: Once | INTRAVENOUS | Status: AC | PRN
  Administered 2018-12-04: 75 mL via INTRAVENOUS

## 2018-12-05 ENCOUNTER — Telehealth (INDEPENDENT_AMBULATORY_CARE_PROVIDER_SITE_OTHER): Payer: Self-pay

## 2018-12-05 NOTE — Telephone Encounter (Signed)
A call was left regarding the patient's CT chest aorta w/cm. Dr . Lucky Cowboy reviewed the CT and per Dr. Lucky Cowboy the patient is stable on a vascular level but she needs to see her PCP. [facility] radiology was informed of this.

## 2018-12-12 ENCOUNTER — Ambulatory Visit (INDEPENDENT_AMBULATORY_CARE_PROVIDER_SITE_OTHER): Payer: Medicare Other | Admitting: Vascular Surgery

## 2018-12-23 ENCOUNTER — Ambulatory Visit (INDEPENDENT_AMBULATORY_CARE_PROVIDER_SITE_OTHER): Payer: PPO | Admitting: Vascular Surgery

## 2018-12-23 ENCOUNTER — Encounter (INDEPENDENT_AMBULATORY_CARE_PROVIDER_SITE_OTHER): Payer: Self-pay | Admitting: Vascular Surgery

## 2018-12-23 VITALS — BP 161/82 | HR 82 | Resp 16 | Ht <= 58 in | Wt 140.8 lb

## 2018-12-23 DIAGNOSIS — I700 Atherosclerosis of aorta: Secondary | ICD-10-CM

## 2018-12-23 DIAGNOSIS — I10 Essential (primary) hypertension: Secondary | ICD-10-CM

## 2018-12-23 DIAGNOSIS — I4891 Unspecified atrial fibrillation: Secondary | ICD-10-CM

## 2018-12-23 DIAGNOSIS — E1142 Type 2 diabetes mellitus with diabetic polyneuropathy: Secondary | ICD-10-CM | POA: Diagnosis not present

## 2018-12-23 NOTE — Progress Notes (Signed)
MRN : [id number]  Vanessa Young is a 78 y.o. (07/29/1941) female who presents with chief complaint of  Chief Complaint  Patient presents with  . Follow-up  .  History of Present Illness: Patient returns today in follow up of thoracic aortic plaque/ulceration.  She is doing well with no major complaints after being seen in the hospital a couple of months ago.  No new chest pain.  No signs of peripheral embolization.  She had a repeat CT scan about a month after discharge which I have independently reviewed.  This shows a stable fibrofatty plaque in the descending thoracic aorta without significant change from previous study.  No aneurysmal degeneration and no stenosis.  Current Outpatient Medications  Medication Sig Dispense Refill  . allopurinol (ZYLOPRIM) 100 MG tablet Take 100 mg by mouth.    Marland Kitchen aspirin EC 81 MG EC tablet Take 1 tablet (81 mg total) by mouth daily. 30 tablet 0  . diltiazem (CARDIZEM) 120 MG tablet Take 120 mg by mouth daily. Patient takes '120mg'$  tablet in the am and '180mg'$  in the evening    . diltiazem (DILACOR XR) 180 MG 24 hr capsule Take 180 mg by mouth every evening.    . escitalopram (LEXAPRO) 5 MG tablet Take 5 mg by mouth at bedtime.     . furosemide (LASIX) 20 MG tablet Take 20 mg by mouth.    . guanFACINE (TENEX) 1 MG tablet Take 1 mg by mouth 2 (two) times daily.     Marland Kitchen losartan (COZAAR) 100 MG tablet Take 100 mg by mouth daily.    . magnesium oxide (MAG-OX) 400 MG tablet Take 400 mg by mouth daily.     . metFORMIN (GLUMETZA) 500 MG (MOD) 24 hr tablet Take 500 mg by mouth daily with breakfast.    . omeprazole (PRILOSEC) 20 MG capsule Take 20 mg by mouth daily.    . rivaroxaban (XARELTO) 20 MG TABS tablet Take 20 mg by mouth daily with supper.    . rosuvastatin (CRESTOR) 5 MG tablet Take 5 mg by mouth daily. Daily on Monday and Thursday     No current facility-administered medications for this visit.     Past Medical History:  Diagnosis Date  . A-fib ([place])   .  Depression   . Diabetes mellitus without complication ([place])   . GERD (gastroesophageal reflux disease)   . Hypercholesteremia   . Hypertension     Past Surgical History:  Procedure Laterality Date  . CYSTOSCOPY      Social History Social History   Tobacco Use  . Smoking status: Never Smoker  . Smokeless tobacco: Never Used  Substance Use Topics  . Alcohol use: No  . Drug use: No    Family History Family History  Problem Relation Age of Onset  . CVA Mother   . CVA Father     Allergies  Allergen Reactions  . Keflex [Cephalexin] Hives  . Hydrochlorothiazide W-Triamterene Nausea Only  . Lisinopril Other (See Comments)    Unknown    . Penicillins Hives  . Statins Other (See Comments)    Unknown      REVIEW OF SYSTEMS (Negative unless checked)  Constitutional: '[]'$ Weight loss  '[]'$ Fever  '[]'$ Chills Cardiac: '[]'$ Chest pain   '[]'$ Chest pressure   '[x]'$ Palpitations   '[]'$ Shortness of breath when laying flat   '[]'$ Shortness of breath at rest   '[x]'$ Shortness of breath with exertion. Vascular:  '[]'$ Pain in legs with walking   '[]'$ Pain in legs at  rest   '[]'$ Pain in legs when laying flat   '[]'$ Claudication   '[]'$ Pain in feet when walking  '[]'$ Pain in feet at rest  '[]'$ Pain in feet when laying flat   '[]'$ History of DVT   '[]'$ Phlebitis   '[]'$ Swelling in legs   '[]'$ Varicose veins   '[]'$ Non-healing ulcers Pulmonary:   '[]'$ Uses home oxygen   '[]'$ Productive cough   '[]'$ Hemoptysis   '[]'$ Wheeze  '[]'$ COPD   '[]'$ Asthma Neurologic:  '[]'$ Dizziness  '[]'$ Blackouts   '[]'$ Seizures   '[]'$ History of stroke   '[]'$ History of TIA  '[]'$ Aphasia   '[]'$ Temporary blindness   '[]'$ Dysphagia   '[]'$ Weakness or numbness in arms   '[]'$ Weakness or numbness in legs Musculoskeletal:  '[x]'$ Arthritis   '[]'$ Joint swelling   '[]'$ Joint pain   '[]'$ Low back pain Hematologic:  '[]'$ Easy bruising  '[]'$ Easy bleeding   '[]'$ Hypercoagulable state   '[]'$ Anemic   Gastrointestinal:  '[]'$ Blood in stool   '[]'$ Vomiting blood  '[x]'$ Gastroesophageal reflux/heartburn   '[]'$ Abdominal pain Genitourinary:  '[]'$ Chronic kidney disease    '[]'$ Difficult urination  '[]'$ Frequent urination  '[]'$ Burning with urination   '[]'$ Hematuria Skin:  '[]'$ Rashes   '[]'$ Ulcers   '[]'$ Wounds Psychological:  '[x]'$ History of anxiety   '[]'$  History of major depression.  Physical Examination  BP (!) 161/82 (BP Location: Right Arm, Patient Position: Sitting)   Pulse 82   Resp 16   Ht '4\' 9"'$  (1.448 m)   Wt 140 lb 12.8 oz (63.9 kg)   BMI 30.47 kg/m  Gen:  WD/WN, NAD Head: [place]/AT, No temporalis wasting. Ear/Nose/Throat: Hearing grossly intact, nares w/o erythema or drainage Eyes: Conjunctiva clear. Sclera non-icteric Neck: Supple.  Trachea midline Pulmonary:  Good air movement, no use of accessory muscles.  Cardiac: RRR, no JVD Vascular:  Vessel Right Left  Radial Palpable Palpable                       Musculoskeletal: M/S 5/5 throughout.  No deformity or atrophy.  No edema. Neurologic: Sensation grossly intact in extremities.  Symmetrical.  Speech is fluent.  Psychiatric: Judgment intact, Mood & affect appropriate for pt's clinical situation. Dermatologic: No rashes or ulcers noted.  No cellulitis or open wounds.       Labs Recent Results (from the past 2160 hour(s))  Basic metabolic panel     Status: Abnormal   Collection Time: 11/10/18  4:31 AM  Result Value Ref Range   Sodium 140 135 - 145 mmol/L   Potassium 3.5 3.5 - 5.1 mmol/L    Comment: HEMOLYSIS AT THIS LEVEL MAY AFFECT RESULT   Chloride 103 98 - 111 mmol/L   CO2 25 22 - 32 mmol/L   Glucose, Bld 221 (H) 70 - 99 mg/dL   BUN 22 8 - 23 mg/dL   Creatinine, Ser 0.84 0.44 - 1.00 mg/dL   Calcium 9.3 8.9 - 10.3 mg/dL   GFR calc non Af Amer >60 >60 mL/min   GFR calc Af Amer >60 >60 mL/min    Comment: (NOTE) The eGFR has been calculated using the CKD EPI equation. This calculation has not been validated in all clinical situations. eGFR's persistently <60 mL/min signify possible Chronic Kidney Disease.    Anion gap 12 5 - 15    Comment: Performed at [facility], [address] [place], [place] [address]  CBC     Status: Abnormal   Collection Time: 11/10/18  4:31 AM  Result Value Ref Range   WBC 7.7 4.0 - 10.5 K/uL   RBC 5.32 (H) 3.87 -  5.11 MIL/uL   Hemoglobin 15.0 12.0 - 15.0 g/dL   HCT 46.6 (H) 36.0 - 46.0 %   MCV 87.6 80.0 - 100.0 fL   MCH 28.2 26.0 - 34.0 pg   MCHC 32.2 30.0 - 36.0 g/dL   RDW 13.4 11.5 - 15.5 %   Platelets 288 150 - 400 K/uL   nRBC 0.0 0.0 - 0.2 %    Comment: Performed at [facility], [address] [place], [place] [address]  Troponin I - ONCE - STAT     Status: None   Collection Time: 11/10/18  4:31 AM  Result Value Ref Range   Troponin I <0.03 <0.03 ng/mL    Comment: Performed at [facility], [address] [place], [place] [address]  Hepatic function panel     Status: None   Collection Time: 11/10/18  4:31 AM  Result Value Ref Range   Total Protein 7.7 6.5 - 8.1 g/dL   Albumin 4.3 3.5 - 5.0 g/dL   AST 28 15 - 41 U/L   ALT 14 0 - 44 U/L   Alkaline Phosphatase 100 38 - 126 U/L   Total Bilirubin 0.4 0.3 - 1.2 mg/dL   Bilirubin, Direct <0.1 0.0 - 0.2 mg/dL   Indirect Bilirubin NOT CALCULATED 0.3 - 0.9 mg/dL    Comment: Performed at [facility], [address] [place], [place] [address]  Brain natriuretic peptide     Status: None   Collection Time: 11/10/18  4:31 AM  Result Value Ref Range   B Natriuretic Peptide 93.0 0.0 - 100.0 pg/mL    Comment: Performed at [facility], [address] [place], [place] [address]  Magnesium     Status: Abnormal   Collection Time: 11/10/18  4:31 AM  Result Value Ref Range   Magnesium 1.6 (L) 1.7 - 2.4 mg/dL    Comment: Performed at [facility], [address] [place], [place] [address]  Phosphorus     Status: None   Collection Time: 11/10/18  4:31 AM  Result Value Ref Range   Phosphorus 3.3 2.5 - 4.6 mg/dL    Comment: Performed at [facility], [address] [place], [place] [address]  ECHOCARDIOGRAM COMPLETE      Status: None   Collection Time: 11/10/18 10:57 AM  Result Value Ref Range   Weight 2,121.6 oz   Height 57 in   BP 150/81 mmHg  Glucose, capillary     Status: Abnormal   Collection Time: 11/10/18 11:39 AM  Result Value Ref Range   Glucose-Capillary 200 (H) 70 - 99 mg/dL  Glucose, capillary     Status: Abnormal   Collection Time: 11/10/18  4:49 PM  Result Value Ref Range   Glucose-Capillary 144 (H) 70 - 99 mg/dL  Glucose, capillary     Status: Abnormal   Collection Time: 11/10/18  8:50 PM  Result Value Ref Range   Glucose-Capillary 183 (H) 70 - 99 mg/dL   Comment 1 Notify RN    Comment 2 Document in Chart   CBC     Status: None   Collection Time: 11/11/18  4:37 AM  Result Value Ref Range   WBC 6.9 4.0 - 10.5 K/uL   RBC 4.72 3.87 - 5.11 MIL/uL   Hemoglobin 13.4 12.0 - 15.0 g/dL   HCT 42.1 36.0 - 46.0 %   MCV 89.2 80.0 - 100.0 fL   MCH 28.4 26.0 - 34.0 pg   MCHC 31.8 30.0 -  36.0 g/dL   RDW 13.5 11.5 - 15.5 %   Platelets 278 150 - 400 K/uL   nRBC 0.0 0.0 - 0.2 %    Comment: Performed at [facility], [address] [place], [place] [address]  Basic metabolic panel     Status: Abnormal   Collection Time: 11/11/18  4:37 AM  Result Value Ref Range   Sodium 141 135 - 145 mmol/L   Potassium 4.2 3.5 - 5.1 mmol/L   Chloride 104 98 - 111 mmol/L   CO2 29 22 - 32 mmol/L   Glucose, Bld 191 (H) 70 - 99 mg/dL   BUN 16 8 - 23 mg/dL   Creatinine, Ser 0.75 0.44 - 1.00 mg/dL   Calcium 9.2 8.9 - 10.3 mg/dL   GFR calc non Af Amer >60 >60 mL/min   GFR calc Af Amer >60 >60 mL/min    Comment: (NOTE) The eGFR has been calculated using the CKD EPI equation. This calculation has not been validated in all clinical situations. eGFR's persistently <60 mL/min signify possible Chronic Kidney Disease.    Anion gap 8 5 - 15    Comment: Performed at [facility], [address] [place], [place] [address]  Glucose, capillary     Status: Abnormal   Collection Time: 11/11/18   8:38 AM  Result Value Ref Range   Glucose-Capillary 182 (H) 70 - 99 mg/dL    Radiology Ct Angio Chest Aorta W &/or Wo Contrast  Result Date: 12/04/2018 CLINICAL DATA:  78 year old female with aortic atherosclerosis and ulcerated plaque. EXAM: CT ANGIOGRAPHY CHEST WITH CONTRAST TECHNIQUE: Multidetector CT imaging of the chest was performed using the standard protocol during bolus administration of intravenous contrast. Multiplanar CT image reconstructions and MIPs were obtained to evaluate the vascular anatomy. CONTRAST:  41m ISOVUE-370 IOPAMIDOL (ISOVUE-370) INJECTION 76% COMPARISON:  Prior CT scan of the chest 11/10/2018 FINDINGS: Cardiovascular: Initial noncontrast enhanced images demonstrate no evidence of acute intramural hematoma or hemorrhage. Conventional 3 vessel arch anatomy. Elongation of the infundibulum and proximal descending thoracic aorta. The thoracic aorta is normal in caliber. No evidence of aneurysm or dissection. Mild heterogeneous atherosclerotic plaque in the arch. Atherosclerotic plaque in the descending thoracic aorta is also present. As noted previously, there is ulcerated fibrofatty atherosclerotic plaque along the posterior wall of the distal descending thoracic aorta. Calcifications present along the coronary arteries. The heart is within normal limits for size. No pericardial effusion. Mediastinum/Nodes: Unremarkable CT appearance of the thyroid gland. No suspicious mediastinal or hilar adenopathy. No soft tissue mediastinal mass. The thoracic esophagus is unremarkable. Lungs/Pleura: Small 4 in 5 mm subpleural pulmonary nodules in the anterior inferior aspect of the right middle lobe. No acute airspace consolidation, pulmonary edema, pleural effusion or pneumothorax. Upper Abdomen: Low attenuation of the hepatic parenchyma consistent with steatosis. Small transient hepatic attenuation difference in the anterior aspect of segment 2. Small 5 mm arterially enhancing lesion in the  upper pole of the left kidney. This abnormality could not be identified on the recent prior CT scan. 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 No acute abnormality within the visualized upper abdomen. Musculoskeletal: Stable mild chronic superior endplate compression fractures at T2 and T3. No acute osseous abnormality. Review of the MIP images confirms the above findings. IMPRESSION: 1. Aortic atherosclerosis with focally ulcerated fibrofatty plaque along the posterior wall of the descending thoracic aorta. No evidence of dissection, aneurysm or acute abnormality. Aortic Atherosclerosis (ICD10-170.0). 2. Small 5 mm arterially enhancing  lesion in the upper pole of the left kidney concerning for a small renal cell carcinoma. Recommend further evaluation with gadolinium enhanced abdominal MRI in 6 months. 3. Coronary artery calcifications. 4. Small subpleural pulmonary nodules in the right middle lobe measuring no larger than 5 mm. No follow-up needed if patient is low-risk (and has no known or suspected primary neoplasm). Non-contrast chest CT can be considered in 12 months if patient is high-risk. This recommendation follows the consensus statement: Guidelines for Management of Incidental Pulmonary Nodules Detected on CT Images: From the Fleischner Society 2017; Radiology 2017; 284:228-243. 5. Hepatic steatosis. 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 7. 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 These results will be called to the ordering clinician or representative by the Radiologist Assistant, and communication documented in the PACS or zVision Dashboard. Signed, Criselda Peaches, MD, [facility] Radiology Electronically Signed   By: Jacqulynn Cadet M.D.   On: 12/04/2018 15:43    Assessment/Plan  Benign essential hypertension blood pressure control important in reducing the progression of  atherosclerotic disease. On appropriate oral medications.   Atrial fibrillation ([place]) On anticoagulation  Type 2 diabetes mellitus with diabetic polyneuropathy, without long-term current use of insulin (HCC) blood glucose control important in reducing the progression of atherosclerotic disease. Also, involved in wound healing. On appropriate medications.   Thoracic aortic atherosclerosis ([place]) She had a repeat CT scan about a month after discharge which I have independently reviewed.  This shows a stable fibrofatty plaque in the descending thoracic aorta without significant change from previous study.  No aneurysmal degeneration and no stenosis. She is already on Xarelto for atrial fibrillation and will continue this.  She is also taking a baby aspirin a day and will continue this.  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  No intervention is required and I think stretching out her follow-up to an annual basis at this point would be fine.  This will need to be followed with a CT scan.    Leotis Pain, MD  12/23/2018 11:27 AM    This note was created with Dragon medical transcription system.  Any errors from dictation are purely unintentional

## 2018-12-23 NOTE — Assessment & Plan Note (Signed)
blood pressure control important in reducing the progression of atherosclerotic disease. On appropriate oral medications.  

## 2018-12-23 NOTE — Assessment & Plan Note (Signed)
blood glucose control important in reducing the progression of atherosclerotic disease. Also, involved in wound healing. On appropriate medications.  

## 2018-12-23 NOTE — Assessment & Plan Note (Signed)
She had a repeat CT scan about a month after discharge which I have independently reviewed.  This shows a stable fibrofatty plaque in the descending thoracic aorta without significant change from previous study.  No aneurysmal degeneration and no stenosis. She is already on Xarelto for atrial fibrillation and will continue this.  She is also taking a baby aspirin a day and will continue this.  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  No intervention is required and I think stretching out her follow-up to an annual basis at this point would be fine.  This will need to be followed with a CT scan.

## 2018-12-23 NOTE — Assessment & Plan Note (Signed)
On anticoagulation 

## 2019-01-08 DIAGNOSIS — N2889 Other specified disorders of kidney and ureter: Secondary | ICD-10-CM | POA: Diagnosis not present

## 2019-01-08 DIAGNOSIS — C9200 Acute myeloblastic leukemia, not having achieved remission: Secondary | ICD-10-CM | POA: Diagnosis not present

## 2019-02-11 DIAGNOSIS — D1771 Benign lipomatous neoplasm of kidney: Secondary | ICD-10-CM | POA: Diagnosis not present

## 2019-02-11 DIAGNOSIS — D179 Benign lipomatous neoplasm, unspecified: Secondary | ICD-10-CM | POA: Diagnosis not present

## 2019-05-12 DIAGNOSIS — H43811 Vitreous degeneration, right eye: Secondary | ICD-10-CM | POA: Diagnosis not present

## 2019-05-19 DIAGNOSIS — D1771 Benign lipomatous neoplasm of kidney: Secondary | ICD-10-CM | POA: Diagnosis not present

## 2019-05-25 DIAGNOSIS — F458 Other somatoform disorders: Secondary | ICD-10-CM | POA: Insufficient documentation

## 2019-05-25 DIAGNOSIS — G4733 Obstructive sleep apnea (adult) (pediatric): Secondary | ICD-10-CM | POA: Diagnosis not present

## 2019-05-28 DIAGNOSIS — D1771 Benign lipomatous neoplasm of kidney: Secondary | ICD-10-CM | POA: Diagnosis not present

## 2019-06-10 DIAGNOSIS — D1771 Benign lipomatous neoplasm of kidney: Secondary | ICD-10-CM | POA: Diagnosis not present

## 2019-06-10 DIAGNOSIS — E7800 Pure hypercholesterolemia, unspecified: Secondary | ICD-10-CM | POA: Diagnosis not present

## 2019-06-10 DIAGNOSIS — I10 Essential (primary) hypertension: Secondary | ICD-10-CM | POA: Diagnosis not present

## 2019-06-10 DIAGNOSIS — G4733 Obstructive sleep apnea (adult) (pediatric): Secondary | ICD-10-CM | POA: Diagnosis not present

## 2019-06-10 DIAGNOSIS — E1142 Type 2 diabetes mellitus with diabetic polyneuropathy: Secondary | ICD-10-CM | POA: Diagnosis not present

## 2019-06-11 DIAGNOSIS — D1771 Benign lipomatous neoplasm of kidney: Secondary | ICD-10-CM | POA: Diagnosis not present

## 2019-06-25 DIAGNOSIS — D1771 Benign lipomatous neoplasm of kidney: Secondary | ICD-10-CM | POA: Diagnosis not present

## 2019-07-13 DIAGNOSIS — D3002 Benign neoplasm of left kidney: Secondary | ICD-10-CM | POA: Diagnosis not present

## 2019-07-13 DIAGNOSIS — Z87891 Personal history of nicotine dependence: Secondary | ICD-10-CM | POA: Diagnosis not present

## 2019-07-13 DIAGNOSIS — D1771 Benign lipomatous neoplasm of kidney: Secondary | ICD-10-CM | POA: Diagnosis not present

## 2019-07-13 DIAGNOSIS — N2889 Other specified disorders of kidney and ureter: Secondary | ICD-10-CM | POA: Diagnosis not present

## 2019-07-22 DIAGNOSIS — E1129 Type 2 diabetes mellitus with other diabetic kidney complication: Secondary | ICD-10-CM | POA: Diagnosis not present

## 2019-07-22 DIAGNOSIS — I10 Essential (primary) hypertension: Secondary | ICD-10-CM | POA: Diagnosis not present

## 2019-07-22 DIAGNOSIS — Z1159 Encounter for screening for other viral diseases: Secondary | ICD-10-CM | POA: Diagnosis not present

## 2019-07-22 DIAGNOSIS — E785 Hyperlipidemia, unspecified: Secondary | ICD-10-CM | POA: Diagnosis not present

## 2019-07-22 DIAGNOSIS — R809 Proteinuria, unspecified: Secondary | ICD-10-CM | POA: Diagnosis not present

## 2019-08-07 DIAGNOSIS — I700 Atherosclerosis of aorta: Secondary | ICD-10-CM | POA: Diagnosis not present

## 2019-08-07 DIAGNOSIS — E7800 Pure hypercholesterolemia, unspecified: Secondary | ICD-10-CM | POA: Diagnosis not present

## 2019-08-07 DIAGNOSIS — K219 Gastro-esophageal reflux disease without esophagitis: Secondary | ICD-10-CM | POA: Diagnosis not present

## 2019-08-07 DIAGNOSIS — R079 Chest pain, unspecified: Secondary | ICD-10-CM | POA: Diagnosis not present

## 2019-08-07 DIAGNOSIS — I4891 Unspecified atrial fibrillation: Secondary | ICD-10-CM | POA: Diagnosis not present

## 2019-08-07 DIAGNOSIS — E1142 Type 2 diabetes mellitus with diabetic polyneuropathy: Secondary | ICD-10-CM | POA: Diagnosis not present

## 2019-08-07 DIAGNOSIS — G4733 Obstructive sleep apnea (adult) (pediatric): Secondary | ICD-10-CM | POA: Diagnosis not present

## 2019-08-07 DIAGNOSIS — I10 Essential (primary) hypertension: Secondary | ICD-10-CM | POA: Diagnosis not present

## 2019-08-25 DIAGNOSIS — J22 Unspecified acute lower respiratory infection: Secondary | ICD-10-CM | POA: Diagnosis not present

## 2019-09-07 DIAGNOSIS — G4733 Obstructive sleep apnea (adult) (pediatric): Secondary | ICD-10-CM | POA: Diagnosis not present

## 2019-09-07 DIAGNOSIS — R05 Cough: Secondary | ICD-10-CM | POA: Diagnosis not present

## 2019-09-07 DIAGNOSIS — I700 Atherosclerosis of aorta: Secondary | ICD-10-CM | POA: Diagnosis not present

## 2019-09-07 DIAGNOSIS — I10 Essential (primary) hypertension: Secondary | ICD-10-CM | POA: Diagnosis not present

## 2019-09-07 DIAGNOSIS — I4891 Unspecified atrial fibrillation: Secondary | ICD-10-CM | POA: Diagnosis not present

## 2019-09-09 DIAGNOSIS — J22 Unspecified acute lower respiratory infection: Secondary | ICD-10-CM | POA: Diagnosis not present

## 2019-11-24 DIAGNOSIS — E119 Type 2 diabetes mellitus without complications: Secondary | ICD-10-CM | POA: Diagnosis not present

## 2019-12-12 DIAGNOSIS — Z20828 Contact with and (suspected) exposure to other viral communicable diseases: Secondary | ICD-10-CM | POA: Diagnosis not present

## 2020-05-24 ENCOUNTER — Ambulatory Visit
Admission: EM | Admit: 2020-05-24 | Discharge: 2020-05-24 | Disposition: A | Discharge status: Home / Self Care | Payer: Medicare HMO | Attending: Emergency Medicine | Admitting: Emergency Medicine

## 2020-05-24 ENCOUNTER — Encounter: Payer: Self-pay | Admitting: Emergency Medicine

## 2020-05-24 ENCOUNTER — Other Ambulatory Visit: Payer: Self-pay

## 2020-05-24 DIAGNOSIS — N898 Other specified noninflammatory disorders of vagina: Secondary | ICD-10-CM | POA: Insufficient documentation

## 2020-05-24 DIAGNOSIS — N3001 Acute cystitis with hematuria: Secondary | ICD-10-CM | POA: Diagnosis present

## 2020-05-24 LAB — URINALYSIS, COMPLETE (UACMP) WITH MICROSCOPIC
Bilirubin Urine: NEGATIVE
Glucose, UA: NEGATIVE mg/dL
Ketones, ur: NEGATIVE mg/dL
Nitrite: NEGATIVE
Protein, ur: >300 mg/dL — AB
Specific Gravity, Urine: 1.02 (ref 1.005–1.030)
WBC, UA: >50 WBC/hpf (ref 0–5)
pH: 7.5 (ref 5.0–8.0)

## 2020-05-24 LAB — WET PREP, GENITAL
Clue Cells Wet Prep HPF POC: NONE SEEN
Sperm: NONE SEEN
Trich, Wet Prep: NONE SEEN
Yeast Wet Prep HPF POC: NONE SEEN

## 2020-05-24 MED ORDER — MICONAZOLE NITRATE 2 % EX CREA: 1.0000 "application " | TOPICAL_CREAM | Freq: Two times a day (BID) | CUTANEOUS | 0 refills | Status: DC

## 2020-05-24 MED ORDER — PHENAZOPYRIDINE HCL 200 MG PO TABS: 200.0000 mg | ORAL_TABLET | Freq: Three times a day (TID) | ORAL | 0 refills | Status: DC | PRN

## 2020-05-24 MED ORDER — NITROFURANTOIN MONOHYD MACRO 100 MG PO CAPS: 100.0000 mg | ORAL_CAPSULE | Freq: Two times a day (BID) | ORAL | 0 refills | Status: DC

## 2020-05-24 NOTE — Discharge Instructions (Addendum)
Your urine was suggestive of a urinary tract infection so I am sending you home with Macrobid, which is the antibiotic that you were sent home with the last time.  I have sent off a culture to ensure that you are on the right antibiotics.  We will contact you if we need to change antibiotics. I am also sending you home with Pyridium to help you with your symptoms.   It will turn your urine orange.  Make sure you drink plenty of extra fluids.  Your wet prep was negative for yeast or bacterial vaginosis, however because you are having external itching we can certainly try miconazole 2% external cream to see if that does not help.  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  If you continue to have itching despite using the cream in several days, follow-up with your doctor.

## 2020-05-24 NOTE — ED Triage Notes (Signed)
Patient in today c/o urinary frequency and itching. Patient unsure if itching coming from urine or vagina. Patient denies vaginal discharge. Patient has not tried any OTC medications.

## 2020-05-24 NOTE — ED Provider Notes (Signed)
HPI  SUBJECTIVE:  Vanessa Young is a 79 y.o. female who presents with 2 days of vaginal itching, urinary frequency and vaginal odor. She reports urinary urgency. No dysuria, cloudy or odorous urine, hematuria. No vaginal discharge, labial swelling, erythema, genital rash. No vomiting, fevers, abdominal, back, pelvic pain, vaginal bleeding. No recent antibiotics. She has not been sexually active in 16 years. She tried taking a single dose of leftover Flagyl from 2016 last night. No aggravating or alleviating factors.  Past medical history of diabetes, states that her sugars have been running within normal limits recently, hypertension, atrial fibrillation on Xarelto, pyelonephritis, E. coli UTI and BV in 2016.  She was treated with Macrobid.  UTI was resistant to Bactrim and fluoroquinolones.  No history of atrophic vaginitis, nephrolithiasis, kidney disease. IOE:VOJJKK, Opal Sidles, MD  Has a history of hives with penicillin and cephalosporins.   Past Medical History:  Diagnosis Date  . A-fib ([place])   . Depression   . Diabetes mellitus without complication ([place])   . GERD (gastroesophageal reflux disease)   . Hypercholesteremia   . Hypertension     Past Surgical History:  Procedure Laterality Date  . CYSTOSCOPY      Family History  Problem Relation Age of Onset  . CVA Mother   . Diabetes Mother   . CVA Father     Social History   Tobacco Use  . Smoking status: Former Smoker    Packs/day: 0.25    Years: 20.00    Pack years: 5.00    Types: Cigarettes    Quit date: 2002    Years since quitting: 19.4  . Smokeless tobacco: Never Used  Substance Use Topics  . Alcohol use: No  . Drug use: No    No current facility-administered medications for this encounter.  Current Outpatient Medications:  .  allopurinol (ZYLOPRIM) 100 MG tablet, Take 100 mg by mouth., Disp: , Rfl:  .  diltiazem (CARDIZEM) 120 MG tablet, Take 120 mg by mouth daily. Patient takes 120mg  tablet in the am and 180mg   in the evening, Disp: , Rfl:  .  diltiazem (DILACOR XR) 180 MG 24 hr capsule, Take 180 mg by mouth every evening., Disp: , Rfl:  .  escitalopram (LEXAPRO) 5 MG tablet, Take 5 mg by mouth at bedtime. , Disp: , Rfl:  .  furosemide (LASIX) 20 MG tablet, Take 20 mg by mouth., Disp: , Rfl:  .  guanFACINE (TENEX) 1 MG tablet, Take 1 mg by mouth 2 (two) times daily. , Disp: , Rfl:  .  losartan (COZAAR) 100 MG tablet, Take 100 mg by mouth daily., Disp: , Rfl:  .  magnesium oxide (MAG-OX) 400 MG tablet, Take 400 mg by mouth daily. , Disp: , Rfl:  .  metFORMIN (GLUMETZA) 500 MG (MOD) 24 hr tablet, Take 500 mg by mouth daily with breakfast., Disp: , Rfl:  .  omeprazole (PRILOSEC) 20 MG capsule, Take 20 mg by mouth daily., Disp: , Rfl:  .  rivaroxaban (XARELTO) 20 MG TABS tablet, Take 20 mg by mouth daily with supper., Disp: , Rfl:  .  rosuvastatin (CRESTOR) 5 MG tablet, Take 5 mg by mouth daily. Daily on Monday and Thursday, Disp: , Rfl:  .  aspirin EC 81 MG EC tablet, Take 1 tablet (81 mg total) by mouth daily., Disp: 30 tablet, Rfl: 0 .  miconazole (MICOTIN) 2 % cream, Apply 1 application topically 2 (two) times daily., Disp: 28.35 g, Rfl: 0 .  nitrofurantoin, macrocrystal-monohydrate, (  MACROBID) 100 MG capsule, Take 1 capsule (100 mg total) by mouth 2 (two) times daily. X 5 days, Disp: 10 capsule, Rfl: 0 .  phenazopyridine (PYRIDIUM) 200 MG tablet, Take 1 tablet (200 mg total) by mouth 3 (three) times daily as needed for pain., Disp: 6 tablet, Rfl: 0  Allergies  Allergen Reactions  . Keflex [Cephalexin] Hives  . Hydrochlorothiazide W-Triamterene Nausea Only  . Lisinopril Other (See Comments)    Unknown    . Penicillins Hives  . Statins Other (See Comments)    Unknown      ROS  As noted in HPI.   Physical Exam  BP 139/81 (BP Location: Left Arm)   Pulse 68   Temp 98.1 F (36.7 C) (Oral)   Resp 18   Ht 4\' 9"  (1.448 m)   Wt 61.7 kg   SpO2 96%   BMI 29.43 kg/m  Constitutional: Well  developed, well nourished, no acute distress Eyes:  EOMI, conjunctiva normal bilaterally HENT: Normocephalic, atraumatic,mucus membranes moist Respiratory: Normal inspiratory effort Cardiovascular: Normal rate GI: nondistended soft.  Mild right flank tenderness.  No suprapubic tenderness. Back: No CVAT skin: No rash, skin intact Musculoskeletal: no deformities Neurologic: Alert & oriented x 3, no focal neuro deficits Psychiatric: Speech and behavior appropriate   ED Course   Medications - No data to display  Orders Placed This Encounter  Procedures  . Wet prep, genital    Standing Status:   Standing    Number of Occurrences:   1  . Urine culture    Standing Status:   Standing    Number of Occurrences:   1    Order Specific Question:   List patient's active antibiotics    Answer:   macrobid  . Urinalysis, Complete w Microscopic    Standing Status:   Standing    Number of Occurrences:   1    Results for orders placed or performed during the hospital encounter of 05/24/20 (from the past 24 hour(s))  Urinalysis, Complete w Microscopic     Status: Abnormal   Collection Time: 05/24/20  9:25 AM  Result Value Ref Range   Color, Urine YELLOW YELLOW   APPearance CLOUDY (A) CLEAR   Specific Gravity, Urine 1.020 1.005 - 1.030   pH 7.5 5.0 - 8.0   Glucose, UA NEGATIVE NEGATIVE mg/dL   Hgb urine dipstick MODERATE (A) NEGATIVE   Bilirubin Urine NEGATIVE NEGATIVE   Ketones, ur NEGATIVE NEGATIVE mg/dL   Protein, ur >300 (A) NEGATIVE mg/dL   Nitrite NEGATIVE NEGATIVE   Leukocytes,Ua MODERATE (A) NEGATIVE   Squamous Epithelial / LPF 0-5 0 - 5   WBC, UA >50 0 - 5 WBC/hpf   RBC / HPF 21-50 0 - 5 RBC/hpf   Bacteria, UA FEW (A) NONE SEEN  Wet prep, genital     Status: Abnormal   Collection Time: 05/24/20  9:25 AM   Specimen: Vaginal  Result Value Ref Range   Yeast Wet Prep HPF POC NONE SEEN NONE SEEN   Trich, Wet Prep NONE SEEN NONE SEEN   Clue Cells Wet Prep HPF POC NONE SEEN NONE  SEEN   WBC, Wet Prep HPF POC FEW (A) NONE SEEN   Sperm NONE SEEN    No results found.  ED Clinical Impression  1. Acute cystitis with hematuria   2. Vaginal itching      ED Assessment/Plan  Labs, records reviewed.  As noted in HPI.  Patient had an E. coli UTI in  2016 that was sensitive to cephalosporins, resistant to fluoroquinolones and Bactrim.  Was treated with Macrobid due to allergy to cephalosporins.  She also had BV at that visit and was treated with Flagyl  Wet prep negative for BV, yeast.  Her UA is consistent with a UTI.  Will send for culture to confirm antibiotic choice.  Home with Pyridium, Macrobid, topical miconazole.  Itching could be from atrophic vaginitis however given her history of diabetes she may have an external yeast infection. follow-up with PMD in several days if not getting any better, to the ER if she gets worse.  Sending home with Macrobid due to the allergy to cephalosporins and history of UTI resistant to Bactrim and fluoroquinolones.  She denies any history of kidney disease.  Last BUN/creatinine were normal in 2019  Discussed labs, MDM, treatment plan, and plan for follow-up with patient. Discussed sn/sx that should prompt return to the ED. patient agrees with plan.   Meds ordered this encounter  Medications  . phenazopyridine (PYRIDIUM) 200 MG tablet    Sig: Take 1 tablet (200 mg total) by mouth 3 (three) times daily as needed for pain.    Dispense:  6 tablet    Refill:  0  . nitrofurantoin, macrocrystal-monohydrate, (MACROBID) 100 MG capsule    Sig: Take 1 capsule (100 mg total) by mouth 2 (two) times daily. X 5 days    Dispense:  10 capsule    Refill:  0  . miconazole (MICOTIN) 2 % cream    Sig: Apply 1 application topically 2 (two) times daily.    Dispense:  28.35 g    Refill:  0    *This clinic note was created using [employer]. Therefore, there may be occasional mistakes despite careful proofreading.   ?     Melynda Ripple, MD 05/24/20 1047

## 2020-05-26 LAB — URINE CULTURE: Culture: >=100000 — AB

## 2020-09-05 DIAGNOSIS — R3129 Other microscopic hematuria: Secondary | ICD-10-CM | POA: Insufficient documentation

## 2020-09-05 DIAGNOSIS — R350 Frequency of micturition: Secondary | ICD-10-CM | POA: Insufficient documentation

## 2020-09-05 DIAGNOSIS — N1330 Unspecified hydronephrosis: Secondary | ICD-10-CM | POA: Insufficient documentation

## 2020-09-05 DIAGNOSIS — D1771 Benign lipomatous neoplasm of kidney: Secondary | ICD-10-CM | POA: Insufficient documentation

## 2020-09-06 ENCOUNTER — Ambulatory Visit (INDEPENDENT_AMBULATORY_CARE_PROVIDER_SITE_OTHER): Payer: Medicare HMO

## 2020-09-06 ENCOUNTER — Encounter: Payer: Self-pay | Admitting: Podiatry

## 2020-09-06 ENCOUNTER — Ambulatory Visit: Payer: PPO | Admitting: Podiatry

## 2020-09-06 ENCOUNTER — Other Ambulatory Visit: Payer: Self-pay

## 2020-09-06 ENCOUNTER — Ambulatory Visit: Payer: Medicare HMO | Admitting: Podiatry

## 2020-09-06 DIAGNOSIS — M7751 Other enthesopathy of right foot: Secondary | ICD-10-CM

## 2020-09-06 DIAGNOSIS — M25473 Effusion, unspecified ankle: Secondary | ICD-10-CM

## 2020-09-06 DIAGNOSIS — M779 Enthesopathy, unspecified: Secondary | ICD-10-CM

## 2020-09-06 NOTE — Progress Notes (Signed)
Subjective:  Patient ID: Vanessa Young, female    DOB: [birth year],  MRN: [id number]  Chief Complaint  Patient presents with  . Ankle Pain    patient presents today for right medial ankle pain and swelling and achilles pain x 3 days.  She denies any injury.    79 y.o. female presents with the above complaint.  Patient presents with complaint of right ankle swelling without any pain.  Patient states been going on for 3 days.  She denies any injury.  She states that it started swelling out of nowhere.  She has not seen anyone else prior to see me.  She denies any other acute complaints.  She would like to discuss treatment options.     Review of Systems: Negative except as noted in the HPI. Denies N/V/F/Ch.  Past Medical History:  Diagnosis Date  . A-fib ([place])   . Depression   . Diabetes mellitus without complication ([place])   . GERD (gastroesophageal reflux disease)   . Hypercholesteremia   . Hypertension     Current Outpatient Medications:  .  allopurinol (ZYLOPRIM) 100 MG tablet, Take 100 mg by mouth., Disp: , Rfl:  .  ALPRAZolam (XANAX) 0.25 MG tablet, Take by mouth., Disp: , Rfl:  .  colchicine 0.6 MG tablet, Take by mouth., Disp: , Rfl:  .  diltiazem (CARDIZEM) 120 MG tablet, Take 120 mg by mouth daily. Patient takes 120mg  tablet in the am and 180mg  in the evening, Disp: , Rfl:  .  diltiazem (TIAZAC) 180 MG 24 hr capsule, Take by mouth., Disp: , Rfl:  .  escitalopram (LEXAPRO) 5 MG tablet, Take 5 mg by mouth at bedtime. , Disp: , Rfl:  .  furosemide (LASIX) 20 MG tablet, Take 20 mg by mouth., Disp: , Rfl:  .  guanFACINE (TENEX) 1 MG tablet, Take 1 mg by mouth 2 (two) times daily. , Disp: , Rfl:  .  losartan (COZAAR) 100 MG tablet, Take 100 mg by mouth daily., Disp: , Rfl:  .  magnesium oxide (MAG-OX) 400 MG tablet, Take 400 mg by mouth daily. , Disp: , Rfl:  .  metFORMIN (GLUMETZA) 500 MG (MOD) 24 hr tablet, Take 500 mg by mouth daily with breakfast., Disp: , Rfl:  .  omeprazole  (PRILOSEC) 20 MG capsule, Take 20 mg by mouth daily., Disp: , Rfl:  .  rivaroxaban (XARELTO) 20 MG TABS tablet, Take 20 mg by mouth daily with supper., Disp: , Rfl:  .  rosuvastatin (CRESTOR) 5 MG tablet, Take 5 mg by mouth daily. Daily on Monday and Thursday, Disp: , Rfl:   Social History   Tobacco Use  Smoking Status Former Smoker  . Packs/day: 0.25  . Years: 20.00  . Pack years: 5.00  . Types: Cigarettes  . Quit date: 2002  . Years since quitting: 19.7  Smokeless Tobacco Never Used    Allergies  Allergen Reactions  . Keflex [Cephalexin] Hives  . Hydrochlorothiazide W-Triamterene Nausea Only  . Lisinopril Other (See Comments)    Unknown    . Penicillins Hives  . Statins Other (See Comments)    Unknown    Objective:  There were no vitals filed for this visit. There is no height or weight on file to calculate BMI. Constitutional Well developed. Well nourished.  Vascular Dorsalis pedis pulses palpable bilaterally. Posterior tibial pulses palpable bilaterally. Capillary refill normal to all digits.  No cyanosis or clubbing noted. Pedal hair growth normal.  Neurologic Normal speech. Oriented to  person, place, and time. Epicritic sensation to light touch grossly present bilaterally.  Dermatologic Nails well groomed and normal in appearance. No open wounds. No skin lesions.  Orthopedic:  Generalized swelling noted circumferentially around the ankle.  No pain on palpation.  No pain at the Achilles tendon, peroneal tendon, posterior tibial tendon, ATFL ligament.  Pain with mild range of motion of the ankle joint.  Deep intra-articular pain noted.   Radiographs: 3 views of skeletally mature adult right ankle: Osteoarthritic noted to the anterior aspect of the ankle joint.  Plantar heel spurring noted as well.  Decrease in joint space of the ankle joint noted.  No other bony abnormalities identified. Assessment:   1. Capsulitis of ankle, right   2. Ankle edema    Plan:    Patient was evaluated and treated and all questions answered.  Right ankle edema -I explained to patient the etiology of edema and various treatment options were discussed with the patient.  I believe this is likely due to dependent edema that is leading to generalized swelling to the right lower extremity.  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  I discussed with the patient the importance of elevation as well as compression socks. -Compression anklet was also dispensed as well. -If there is no resolve meant will discuss steroid injection versus Tri-Lock ankle brace.  No follow-ups on file.

## 2020-09-07 ENCOUNTER — Encounter: Payer: Self-pay | Admitting: Podiatry

## 2020-09-07 ENCOUNTER — Other Ambulatory Visit: Payer: Self-pay | Admitting: Podiatry

## 2020-09-07 DIAGNOSIS — M7751 Other enthesopathy of right foot: Secondary | ICD-10-CM

## 2021-02-14 ENCOUNTER — Other Ambulatory Visit: Payer: Self-pay

## 2021-02-14 ENCOUNTER — Ambulatory Visit (INDEPENDENT_AMBULATORY_CARE_PROVIDER_SITE_OTHER): Payer: Medicare HMO

## 2021-02-14 ENCOUNTER — Ambulatory Visit
Admission: EM | Admit: 2021-02-14 | Discharge: 2021-02-14 | Disposition: A | Discharge status: Home / Self Care | Payer: Medicare HMO | Attending: Family Medicine | Admitting: Family Medicine

## 2021-02-14 DIAGNOSIS — R2242 Localized swelling, mass and lump, left lower limb: Secondary | ICD-10-CM

## 2021-02-14 DIAGNOSIS — W010XXA Fall on same level from slipping, tripping and stumbling without subsequent striking against object, initial encounter: Secondary | ICD-10-CM | POA: Diagnosis not present

## 2021-02-14 DIAGNOSIS — M11262 Other chondrocalcinosis, left knee: Secondary | ICD-10-CM | POA: Diagnosis not present

## 2021-02-14 DIAGNOSIS — M7052 Other bursitis of knee, left knee: Secondary | ICD-10-CM | POA: Diagnosis not present

## 2021-02-14 DIAGNOSIS — M25562 Pain in left knee: Secondary | ICD-10-CM

## 2021-02-14 NOTE — ED Triage Notes (Signed)
Patient presents to Urgent Care with complaints of falling on 02/11. She landed on her knees and hitting head. She has swelling and generalizing bruising on lower extremities. Denies losing consciousness, no vomiting, memory loss, or vision changes. She is on a blood thinner.

## 2021-02-14 NOTE — ED Provider Notes (Signed)
[facility]    CSN: [id number] Arrival date & time: 02/14/21  1446      History   Chief Complaint Chief Complaint  Patient presents with  . Leg Pain  . Fall   HPI  80 year old female presents with the above complaint.  Patient states that she fell on 2/11.  She tripped and landed directly on her knees.  She states that she hit her head as well.  No loss of consciousness.  No vision changes.  Patient concerned primarily regarding swelling in her legs.  No significant knee pain at this time.  She has swelling below the left kneecap.  She has a small area of swelling of the left lower leg and of the right lower leg.  Patient states that she is ambulating without difficulty.  Patient states that she felt that she should get checked out as her swelling has not resolved.  No other complaints.  Past Medical History:  Diagnosis Date  . A-fib ([place])   . Depression   . Diabetes mellitus without complication ([place])   . GERD (gastroesophageal reflux disease)   . Hypercholesteremia   . Hypertension     Patient Active Problem List   Diagnosis Date Noted  . 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 Hydronephrosis 09/05/2020  . Increased frequency of urination 09/05/2020  . Microhematuria 09/05/2020  . Aerophagia 05/25/2019  . 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 Paroxysmal atrial fibrillation with rapid ventricular response ([place]) 11/10/2018  . SCC (squamous cell carcinoma), scalp/neck 03/23/2016  . 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 Lesion of bladder 11/08/2014  . Atrial fibrillation ([place]) 01/20/2014  . 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 Osteopenia 01/19/2013  . Adenomatous polyp of colon 02/15/2012  . OA (osteoarthritis) of knee 11/23/2011  . Type 2 diabetes mellitus with microalbuminuria, without long-term current use of insulin ([place]) 09/28/2011  . Type 2 diabetes mellitus with diabetic polyneuropathy, without long-term current use of insulin  ([place]) 09/28/2011  . GERD (gastroesophageal reflux disease) 09/28/2011  . 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 Benign essential hypertension 06/21/2011  . Hypercholesterolemia 06/21/2011  . Generalized anxiety disorder 06/21/2011    Past Surgical History:  Procedure Laterality Date  . CYSTOSCOPY      OB History   No obstetric history on file.      Home Medications    Prior to Admission medications   Medication Sig Start Date End Date Taking? Authorizing Provider  allopurinol (ZYLOPRIM) 100 MG tablet Take 100 mg by mouth.    [provider]  ALPRAZolam Duanne Moron) 0.25 MG tablet Take by mouth. 07/25/20   [provider]  colchicine 0.6 MG tablet Take by mouth. 01/23/19   [provider]  diltiazem (CARDIZEM) 120 MG tablet Take 120 mg by mouth daily. Patient takes 120mg  tablet in the am and 180mg  in the evening    [provider]  diltiazem (TIAZAC) 180 MG 24 hr capsule Take by mouth. 07/25/20 01/21/21  [provider]  escitalopram (LEXAPRO) 5 MG tablet Take 5 mg by mouth at bedtime.     [provider]  furosemide (LASIX) 20 MG tablet Take 20 mg by mouth.    [provider]  guanFACINE (TENEX) 1 MG tablet Take 1 mg by mouth 2 (two) times daily.     [provider]  losartan (COZAAR) 100 MG tablet Take 100 mg by mouth daily.    [provider]  magnesium oxide (MAG-OX) 400  MG tablet Take 400 mg by mouth daily.     [provider]  metFORMIN (GLUMETZA) 500 MG (MOD) 24 hr tablet Take 500 mg by mouth daily with breakfast.    [provider]  omeprazole (PRILOSEC) 20 MG capsule Take 20 mg by mouth daily.    [provider]  rivaroxaban (XARELTO) 20 MG TABS tablet Take 20 mg by mouth daily with supper.    [provider]  rosuvastatin (CRESTOR) 5 MG tablet Take 5 mg by mouth daily. Daily on Monday and Thursday    [provider]     Family History Family History  Problem Relation Age of Onset  . CVA Mother   . Diabetes Mother   . CVA Father     Social History Social History   Tobacco Use  . Smoking status: Former Smoker    Packs/day: 0.25    Years: 20.00    Pack years: 5.00    Types: Cigarettes    Quit date: 2002    Years since quitting: 20.1  . Smokeless tobacco: Never Used  Vaping Use  . Vaping Use: Never used  Substance Use Topics  . Alcohol use: No  . Drug use: No     Allergies   Keflex [cephalexin], Hydrochlorothiazide w-triamterene, Lisinopril, Penicillins, and Statins   Review of Systems Review of Systems Per HPI  Physical Exam Triage Vital Signs ED Triage Vitals [02/14/21 1500]  Enc Vitals Group     BP 139/66     Pulse Rate 68     Resp 16     Temp 98.3 F (36.8 C)     Temp Source Oral     SpO2 97 %     Weight      Height      Head Circumference      Peak Flow      Pain Score 0     Pain Loc      Pain Edu?      Excl. in [place]?    Updated Vital Signs BP 139/66 (BP Location: Right Arm)   Pulse 68   Temp 98.3 F (36.8 C) (Oral)   Resp 16   SpO2 97%   Visual Acuity Right Eye Distance:   Left Eye Distance:   Bilateral Distance:    Right Eye Near:   Left Eye Near:    Bilateral Near:     Physical Exam Constitutional:      General: She is not in acute distress.    Appearance: Normal appearance. She is not ill-appearing.  HENT:     Head: Normocephalic and atraumatic.  Eyes:     General:        Right eye: No discharge.        Left eye: No discharge.     Conjunctiva/sclera: Conjunctivae normal.  Pulmonary:     Effort: Pulmonary effort is normal. No respiratory distress.  Musculoskeletal:     Comments: Right knee - no tenderness to palpation. Small hematoma noted on the right lower leg.  Left knee -infrapatellar swelling.  No tenderness to palpation.  No hematoma noted of the left lower leg.  Neurological:     Mental Status: She is alert.  Psychiatric:         Mood and Affect: Mood normal.        Behavior: Behavior normal.    UC Treatments / Results  Labs (all labs ordered are listed, but only abnormal results are displayed) Labs Reviewed - No data  to display  EKG   Radiology DG Knee Complete 4 Views Left  Result Date: 02/14/2021 CLINICAL DATA:  Fall.  Left knee swelling. EXAM: LEFT KNEE - COMPLETE 4+ VIEW COMPARISON:  03/10/2016. FINDINGS: Prominent infrapatellar soft tissue swelling. No prominent effusion noted. No acute bony or joint abnormality. No evidence of fracture dislocation. Tricompartment degenerative change with chondrocalcinosis. IMPRESSION: 1. Prominent infrapatellar soft tissue swelling. No acute bony or joint abnormality identified. 2. Tricompartment degenerative change with chondrocalcinosis. Electronically Signed   By: Marcello Moores  Register   On: 02/14/2021 16:04    Procedures Procedures (including critical care time)  Medications Ordered in UC Medications - No data to display  Initial Impression / Assessment and Plan / UC Course  I have reviewed the triage vital signs and the nursing notes.  Pertinent labs & imaging results that were available during my care of the patient were reviewed by me and considered in my medical decision making (see chart for details).    80 year old female presents for evaluation after suffering a fall.  Infrapatellar swelling noted on the left knee.  Suspected traumatic bursitis versus hematoma.  Patient currently pain-free.  Offered corticosteroids and patient elected to treat supportively with rest and ice.  Supportive care.  Final Clinical Impressions(s) / UC Diagnoses   Final diagnoses:  Infrapatellar bursitis of left knee     Discharge Instructions     Rest, ice.  Tylenol as needed.  Take care  Dr. Lacinda Axon    ED Prescriptions    None     PDMP not reviewed this encounter.   Coral Spikes, [place] 02/14/21 1626

## 2021-02-14 NOTE — Discharge Instructions (Signed)
Rest, ice.  Tylenol as needed.   Take care  Dr. Sophiah Rolin  

## 2021-06-03 ENCOUNTER — Other Ambulatory Visit: Payer: Self-pay

## 2021-06-03 ENCOUNTER — Ambulatory Visit
Admission: EM | Admit: 2021-06-03 | Discharge: 2021-06-03 | Disposition: A | Discharge status: Home / Self Care | Payer: Medicare HMO | Attending: Emergency Medicine | Admitting: Emergency Medicine

## 2021-06-03 DIAGNOSIS — N390 Urinary tract infection, site not specified: Secondary | ICD-10-CM | POA: Insufficient documentation

## 2021-06-03 LAB — POCT URINALYSIS DIP (DEVICE)
Bilirubin Urine: NEGATIVE
Glucose, UA: NEGATIVE mg/dL
Ketones, ur: NEGATIVE mg/dL
Nitrite: POSITIVE — AB
Protein, ur: 100 mg/dL — AB
Specific Gravity, Urine: 1.02 (ref 1.005–1.030)
Urobilinogen, UA: 0.2 mg/dL (ref 0.0–1.0)
pH: 6.5 (ref 5.0–8.0)

## 2021-06-03 MED ORDER — NITROFURANTOIN MONOHYD MACRO 100 MG PO CAPS
100.0000 mg | ORAL_CAPSULE | Freq: Two times a day (BID) | ORAL | 0 refills | Status: DC
Start: 2021-06-03 — End: 2021-06-13

## 2021-06-03 MED ORDER — PHENAZOPYRIDINE HCL 200 MG PO TABS: 200.0000 mg | ORAL_TABLET | Freq: Three times a day (TID) | ORAL | 0 refills | Status: DC

## 2021-06-03 NOTE — Discharge Instructions (Addendum)

## 2021-06-03 NOTE — ED Provider Notes (Signed)
[facility]    CSN: [id number] Arrival date & time: 06/03/21  1502      History   Chief Complaint Chief Complaint  Patient presents with   Urinary Frequency    HPI Vanessa Young is a 80 y.o. female.   HPI  80 year old female here for evaluation of urinary complaints.  Patient reports that she has been experiencing urinary urgency and frequency with discomfort during urination for the past 2 days.  She is also had some lower abdominal pain and increase in urination at night.  She denies fever, blood in urine, low back pain, nausea, or vomiting.   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   Hydronephrosis 09/05/2020   Increased frequency of urination 09/05/2020   Microhematuria 09/05/2020   Aerophagia 05/25/2019   Thoracic aortic atherosclerosis ([place]) 12/23/2018   Paroxysmal atrial fibrillation with rapid ventricular response ([place]) 11/10/2018   SCC (squamous cell carcinoma), scalp/neck 03/23/2016   Mild obstructive sleep apnea 06/11/2015   Lesion of bladder 11/08/2014   Atrial fibrillation ([place]) 01/20/2014   Osteoarthrosis, 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   Osteopenia 01/19/2013   Adenomatous polyp of colon 02/15/2012   OA (osteoarthritis) of knee 11/23/2011   Type 2 diabetes mellitus with microalbuminuria, without long-term current use of insulin ([place]) 09/28/2011   Type 2 diabetes mellitus with diabetic polyneuropathy, without long-term current use of insulin ([place]) 09/28/2011   GERD (gastroesophageal reflux disease) 09/28/2011   Major depressive disorder, recurrent, in partial remission ([place]) 09/28/2011   Benign essential hypertension 06/21/2011   Hypercholesterolemia 06/21/2011   Generalized anxiety disorder  06/21/2011    Past Surgical History:  Procedure Laterality Date   CYSTOSCOPY      OB History   No obstetric history on file.      Home Medications    Prior to Admission medications   Medication Sig Start Date End Date Taking? Authorizing Provider  allopurinol (ZYLOPRIM) 100 MG tablet Take 100 mg by mouth.   Yes [provider]  ALPRAZolam Duanne Moron) 0.25 MG tablet Take by mouth. 07/25/20  Yes [provider]  colchicine 0.6 MG tablet Take by mouth. 01/23/19  Yes [provider]  diltiazem (CARDIZEM CD) 120 MG 24 hr capsule Take 120 mg by mouth in the morning. 05/05/21  Yes [provider]  diltiazem (CARDIZEM CD) 180 MG 24 hr capsule Take 180 mg by mouth at bedtime. 05/17/21  Yes [provider]  escitalopram (LEXAPRO) 5 MG tablet Take 5 mg by mouth at bedtime.    Yes [provider]  furosemide (LASIX) 20 MG tablet Take 20 mg by mouth.   Yes [provider]  guanFACINE (TENEX) 1 MG tablet Take 1 mg by mouth 2 (two) times daily.    Yes [provider]  losartan (COZAAR) 100 MG tablet Take 100 mg by mouth daily.   Yes [provider]  magnesium oxide (MAG-OX) 400 (240 Mg) MG tablet Take 1 tablet by mouth daily. 05/16/21  Yes [provider]  magnesium oxide (MAG-OX) 400 MG tablet Take 400 mg by mouth daily.    Yes [provider]  metFORMIN (GLUCOPHAGE-XR) 500 MG 24 hr tablet Take 1 tablet by mouth 2 (two) times daily.  03/09/21  Yes [provider]  metFORMIN (GLUMETZA) 500 MG (MOD) 24 hr tablet Take 500 mg by mouth daily with breakfast.   Yes [provider]  nitrofurantoin, macrocrystal-monohydrate, (MACROBID) 100 MG capsule Take 1 capsule (100 mg total) by mouth 2 (two) times daily. 06/03/21  Yes Margarette Canada, NP  omeprazole (PRILOSEC) 20 MG capsule Take 20 mg by mouth daily.   Yes [provider]  phenazopyridine (PYRIDIUM) 200 MG tablet Take 1 tablet (200 mg total) by  mouth 3 (three) times daily. 06/03/21  Yes Margarette Canada, NP  rivaroxaban (XARELTO) 20 MG TABS tablet Take 20 mg by mouth daily with supper.   Yes [provider]  rosuvastatin (CRESTOR) 5 MG tablet Take 5 mg by mouth daily. Daily on Monday and Thursday   Yes [provider]    Family History Family History  Problem Relation Age of Onset   CVA Mother    Diabetes Mother    CVA Father     Social History Social History   Tobacco Use   Smoking status: Former    Packs/day: 0.25    Years: 20.00    Pack years: 5.00    Types: Cigarettes    Quit date: 2002    Years since quitting: 20.4   Smokeless tobacco: Never  Vaping Use   Vaping Use: Never used  Substance Use Topics   Alcohol use: No   Drug use: No     Allergies   Keflex [cephalexin], Hydrochlorothiazide w-triamterene, Lisinopril, Penicillins, and Statins   Review of Systems Review of Systems  Constitutional:  Negative for activity change, appetite change and fever.  Gastrointestinal:  Positive for abdominal pain. Negative for nausea and vomiting.  Genitourinary:  Positive for dysuria, frequency and urgency. Negative for hematuria.  Musculoskeletal:  Negative for back pain.    Physical Exam Triage Vital Signs ED Triage Vitals  Enc Vitals Group     BP 06/03/21 1521 (!) 143/76     Pulse Rate 06/03/21 1521 72     Resp 06/03/21 1521 18     Temp 06/03/21 1521 98.5 F (36.9 C)     Temp Source 06/03/21 1521 Oral     SpO2 06/03/21 1521 97 %     Weight 06/03/21 1517 132 lb (59.9 kg)     Height 06/03/21 1517 4\' 11"  (1.499 m)     Head Circumference --      Peak Flow --      Pain Score 06/03/21 1517 0     Pain Loc --      Pain Edu? --      Excl. in [place]? --    No data found.  Updated Vital Signs BP (!) 143/76 (BP Location: Left Arm)   Pulse 72   Temp 98.5 F (36.9 C) (Oral)   Resp 18   Ht 4\' 11"  (1.499 m)   Wt 132 lb (59.9 kg)   SpO2 97%   BMI 26.66 kg/m   Visual Acuity Right Eye Distance:    Left Eye Distance:   Bilateral Distance:    Right Eye Near:   Left Eye Near:    Bilateral Near:     Physical Exam Vitals and nursing note reviewed.  Constitutional:      Appearance: Normal appearance.  HENT:     Head: Normocephalic and atraumatic.  Cardiovascular:     Rate and Rhythm: Normal rate and regular rhythm.     Pulses: Normal pulses.     Heart sounds:  Normal heart sounds. No murmur heard.   No gallop.  Pulmonary:     Effort: Pulmonary effort is normal.     Breath sounds: Normal breath sounds. No wheezing, rhonchi or rales.  Abdominal:     Tenderness: There is no abdominal tenderness. There is no right CVA tenderness, left CVA tenderness, guarding or rebound.  Skin:    General: Skin is warm and dry.     Capillary Refill: Capillary refill takes less than 2 seconds.     Findings: No erythema or rash.  Neurological:     General: No focal deficit present.     Mental Status: She is alert and oriented to person, place, and time.  Psychiatric:        Mood and Affect: Mood normal.        Behavior: Behavior normal.        Thought Content: Thought content normal.        Judgment: Judgment normal.     UC Treatments / Results  Labs (all labs ordered are listed, but only abnormal results are displayed) Labs Reviewed  POCT URINALYSIS DIP (DEVICE) - Abnormal; Notable for the following components:      Result Value   Hgb urine dipstick LARGE (*)    Protein, ur 100 (*)    Nitrite POSITIVE (*)    Leukocytes,Ua MODERATE (*)    All other components within normal limits  URINE CULTURE  POCT URINALYSIS DIPSTICK, ED / UC    EKG   Radiology No results found.  Procedures Procedures (including critical care time)  Medications Ordered in UC Medications - No data to display  Initial Impression / Assessment and Plan / UC Course  I have reviewed the triage vital signs and the nursing notes.  Pertinent labs & imaging results that were available during my care of the  patient were reviewed by me and considered in my medical decision making (see chart for details).  Very pleasant 80 year old female here for evaluation of urinary complaints as outlined in HPI above.  Patient's physical exam reveals benign cardiopulmonary exam.  Abdomen is soft, nontender, nondistended with positive bowel sounds in all 4 quadrants.  No CVA tenderness on exam.  Urine collected at triage shows large blood, 100 protein, nitrite positive, and moderate leukocytes.  We will send urine for culture.  We will treat patient with Macrobid twice daily for 5 days and Pyridium to with urinary discomfort.  We will also send urine for culture and adjust antibiotic therapy based upon those results if necessary.   Final Clinical Impressions(s) / UC Diagnoses   Final diagnoses:  Lower urinary tract infectious disease     Discharge Instructions      Take the Macrobid twice daily for 5 days with food for treatment of urinary tract infection.  Use the Pyridium every 8 hours as needed for urinary discomfort.  This will turn your urine a bright red-orange.  Increase your oral fluid intake so that you increase your urine production and or flushing your urinary system.  Take an over-the-counter probiotic, such as Culturelle-Align-Activia, 1 hour after each dose of antibiotic to prevent diarrhea or yeast infections from forming.  We will culture urine and change the antibiotics if necessary.  Return for reevaluation, or see your primary care provider, for any new or worsening symptoms.      ED Prescriptions     Medication Sig Dispense Auth. Provider   nitrofurantoin, macrocrystal-monohydrate, (MACROBID) 100 MG capsule Take 1 capsule (100 mg total)  by mouth 2 (two) times daily. 10 capsule Margarette Canada, NP   phenazopyridine (PYRIDIUM) 200 MG tablet Take 1 tablet (200 mg total) by mouth 3 (three) times daily. 6 tablet Margarette Canada, NP      PDMP not reviewed this encounter.   Margarette Canada,  NP 06/03/21 1536

## 2021-06-03 NOTE — ED Triage Notes (Signed)
Pt c/o urinary urgency and some slight lower abdomen discomfort since Thursday, pt also reports some discomfort with urination. Pt denies f/n/v/d, hematuria or other symptoms.

## 2021-06-06 LAB — URINE CULTURE
Culture: >=100000 — AB
Special Requests: NORMAL

## 2021-06-13 ENCOUNTER — Ambulatory Visit
Admission: EM | Admit: 2021-06-13 | Discharge: 2021-06-13 | Disposition: A | Discharge status: Home / Self Care | Payer: Medicare HMO | Attending: Emergency Medicine | Admitting: Emergency Medicine

## 2021-06-13 ENCOUNTER — Other Ambulatory Visit: Payer: Self-pay

## 2021-06-13 DIAGNOSIS — R350 Frequency of micturition: Secondary | ICD-10-CM | POA: Insufficient documentation

## 2021-06-13 LAB — URINALYSIS, COMPLETE (UACMP) WITH MICROSCOPIC
Bilirubin Urine: NEGATIVE
Glucose, UA: NEGATIVE mg/dL
Hgb urine dipstick: NEGATIVE
Ketones, ur: NEGATIVE mg/dL
Leukocytes,Ua: NEGATIVE
Nitrite: NEGATIVE
Specific Gravity, Urine: <1.005 — ABNORMAL LOW (ref 1.005–1.030)
pH: 5.5 (ref 5.0–8.0)

## 2021-06-13 MED ORDER — PHENAZOPYRIDINE HCL 200 MG PO TABS: 200.0000 mg | ORAL_TABLET | Freq: Three times a day (TID) | ORAL | 0 refills | Status: DC

## 2021-06-13 NOTE — ED Triage Notes (Signed)
Pt reports finishing ABX for a UTI on Wednesday but doesn't think her infection completely went away. Having urinary frequency and some dysuria

## 2021-06-13 NOTE — ED Provider Notes (Signed)
[facility]    CSN: [id number] Arrival date & time: 06/13/21  1437      History   Chief Complaint Chief Complaint  Patient presents with   Urinary Frequency    HPI Vanessa Young is a 80 y.o. female.   HPI  80 year old female here for evaluation of urinary urgency and frequency.  Patient was evaluated in this emergency department on 06/03/21 and treated for UTI with Macrobid.  Her culture that time grew out E. coli which was pansensitive.  She reports that she finished the antibiotic 06/07/2021 and does not feel like it completely resolved her symptoms.  She states she is no longer having any pain with urination, no low back pain, fever, blood in urine, or GI complaints.  She did develop some slight suprapubic tenderness this morning.  She has returned for repeat urinalysis.  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   Hydronephrosis 09/05/2020   Increased frequency of urination 09/05/2020   Microhematuria 09/05/2020   Aerophagia 05/25/2019   Thoracic aortic atherosclerosis ([place]) 12/23/2018   Paroxysmal atrial fibrillation with rapid ventricular response ([place]) 11/10/2018   SCC (squamous cell carcinoma), scalp/neck 03/23/2016   Mild obstructive sleep apnea 06/11/2015   Lesion of bladder 11/08/2014   Atrial fibrillation ([place]) 01/20/2014   Osteoarthrosis, 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   Osteopenia 01/19/2013   Adenomatous polyp of colon 02/15/2012   OA (osteoarthritis) of knee 11/23/2011   Type 2 diabetes mellitus with microalbuminuria, without long-term current use of insulin ([place]) 09/28/2011   Type 2 diabetes mellitus with diabetic polyneuropathy, without long-term current use of insulin ([place]) 09/28/2011   GERD  (gastroesophageal reflux disease) 09/28/2011   Major depressive disorder, recurrent, in partial remission ([place]) 09/28/2011   Benign essential hypertension 06/21/2011   Hypercholesterolemia 06/21/2011   Generalized anxiety disorder 06/21/2011    Past Surgical History:  Procedure Laterality Date   CYSTOSCOPY      OB History   No obstetric history on file.      Home Medications    Prior to Admission medications   Medication Sig Start Date End Date Taking? Authorizing Provider  allopurinol (ZYLOPRIM) 100 MG tablet Take 100 mg by mouth.    [provider]  ALPRAZolam Duanne Moron) 0.25 MG tablet Take by mouth. 07/25/20   [provider]  colchicine 0.6 MG tablet Take by mouth. 01/23/19   [provider]  diltiazem (CARDIZEM CD) 120 MG 24 hr capsule Take 120 mg by mouth in the morning. 05/05/21   [provider]  diltiazem (CARDIZEM CD) 180 MG 24 hr capsule Take 180 mg by mouth at bedtime. 05/17/21   [provider]  escitalopram (LEXAPRO) 5 MG tablet Take 5 mg by mouth at bedtime.     [provider]  furosemide (LASIX) 20 MG tablet Take 20 mg by mouth.    [provider]  guanFACINE (TENEX) 1 MG tablet Take 1 mg by mouth 2 (two) times daily.     [provider]  losartan (COZAAR) 100 MG tablet Take 100 mg by mouth daily.    [provider]  magnesium oxide (MAG-OX) 400 (240 Mg) MG tablet Take 1 tablet by mouth daily. 05/16/21   [provider]  magnesium oxide (MAG-OX) 400 MG tablet Take 400 mg by mouth daily.     [provider]  metFORMIN (GLUCOPHAGE-XR) 500 MG 24 hr tablet Take 1 tablet by mouth 2 (two) times daily. 03/09/21   [provider]  metFORMIN (GLUMETZA) 500 MG (MOD) 24 hr tablet Take 500 mg by mouth daily with breakfast.    [provider]  omeprazole (PRILOSEC) 20 MG capsule Take 20 mg by mouth daily.    [provider]  phenazopyridine (PYRIDIUM) 200 MG tablet  Take 1 tablet (200 mg total) by mouth 3 (three) times daily. 06/13/21   Margarette Canada, NP  rivaroxaban (XARELTO) 20 MG TABS tablet Take 20 mg by mouth daily with supper.    [provider]  rosuvastatin (CRESTOR) 5 MG tablet Take 5 mg by mouth daily. Daily on Monday and Thursday    [provider]    Family History Family History  Problem Relation Age of Onset   CVA Mother    Diabetes Mother    CVA Father     Social History Social History   Tobacco Use   Smoking status: Former    Packs/day: 0.25    Years: 20.00    Pack years: 5.00    Types: Cigarettes    Quit date: 2002    Years since quitting: 20.5   Smokeless tobacco: Never  Vaping Use   Vaping Use: Never used  Substance Use Topics   Alcohol use: No   Drug use: No     Allergies   Keflex [cephalexin], Hydrochlorothiazide w-triamterene, Lisinopril, Penicillins, and Statins   Review of Systems Review of Systems  Constitutional:  Negative for fever.  Gastrointestinal:  Positive for abdominal pain. Negative for diarrhea, nausea and vomiting.  Genitourinary:  Positive for frequency and urgency. Negative for dysuria and hematuria.  Musculoskeletal:  Negative for back pain.    Physical Exam Triage Vital Signs ED Triage Vitals  Enc Vitals Group     BP 06/13/21 1500 139/72     Pulse Rate 06/13/21 1500 71     Resp 06/13/21 1500 16     Temp 06/13/21 1500 98.8 F (37.1 C)     Temp Source 06/13/21 1500 Oral     SpO2 06/13/21 1500 96 %     Weight 06/13/21 1457 131 lb 13.4 oz (59.8 kg)     Height 06/13/21 1457 4\' 11"  (1.499 m)     Head Circumference --      Peak Flow --      Pain Score 06/13/21 1457 0     Pain Loc --      Pain Edu? --      Excl. in [place]? --    No data found.  Updated Vital Signs BP 139/72 (BP Location: Right Arm)   Pulse 71   Temp 98.8 F (37.1 C) (Oral)   Resp 16   Ht 4\' 11"  (1.499 m)   Wt 131 lb 13.4 oz (59.8 kg)   SpO2 96%   BMI 26.63 kg/m   Visual Acuity Right Eye  Distance:   Left Eye Distance:   Bilateral Distance:    Right Eye Near:   Left Eye Near:    Bilateral Near:     Physical Exam Vitals and nursing note reviewed.  Constitutional:      General: She is not in acute distress.    Appearance: Normal appearance. She is not ill-appearing.  HENT:     Head: Normocephalic and atraumatic.  Cardiovascular:     Rate and Rhythm: Normal rate and regular rhythm.     Pulses: Normal pulses.     Heart sounds: Normal heart sounds. No murmur heard.   No gallop.  Pulmonary:     Effort: Pulmonary effort is normal.     Breath sounds: Normal breath sounds. No wheezing, rhonchi or rales.  Abdominal:     Palpations: Abdomen is soft.     Tenderness: There is abdominal tenderness. There is no right CVA tenderness or left CVA tenderness.  Skin:    General: Skin is warm and dry.     Capillary Refill: Capillary refill takes less than 2 seconds.     Findings: No erythema.  Neurological:     General: No focal deficit present.     Mental Status: She is alert and oriented to person, place, and time.  Psychiatric:        Mood and Affect: Mood normal.        Behavior: Behavior normal.        Thought Content: Thought content normal.        Judgment: Judgment normal.     UC Treatments / Results  Labs (all labs ordered are listed, but only abnormal results are displayed) Labs Reviewed  URINALYSIS, COMPLETE (UACMP) WITH MICROSCOPIC - Abnormal; Notable for the following components:      Result Value   Specific Gravity, Urine <1.005 (*)    Protein, ur TRACE (*)    Bacteria, UA FEW (*)    All other components within normal limits    EKG   Radiology No results found.  Procedures Procedures (including critical care time)  Medications Ordered in UC Medications - No data to display  Initial Impression / Assessment and Plan / UC Course  I have reviewed the triage vital signs and the nursing notes.  Pertinent labs & imaging results that were available  during my care of the patient were reviewed by me and considered in my medical decision making (see chart for details).  Patient is a very pleasant and nontoxic-appearing 80 year old female here for evaluation of urinary complaints as defined in the HPI above.  Patient's physical exam reveals benign cardiopulmonary exam.  Abdomen is soft with mild suprapubic tenderness.  No CVA tenderness on exam.  Urine collected at triage.  Urinalysis shows trace protein and few bacteria but is otherwise unremarkable.  There is no evidence that patient symptoms are coming from an infectious source.  We will have patient follow-up with her primary care provider for further work-up.  We will give patient Pyridium to help with urinary symptoms.   Final Clinical Impressions(s) / UC Diagnoses   Final diagnoses:  Urinary frequency     Discharge Instructions      Take the Pyridium every 8 hours as needed for your urinary urgency and frequency.  Follow-up with your primary care doctor for a GYN exam to examine other causes of your urinary urgency and frequency as your urinalysis today did not reveal any evidence of infection.       ED Prescriptions     Medication Sig Dispense Auth. Provider   phenazopyridine (PYRIDIUM) 200 MG tablet  (Status: Discontinued) Take 1 tablet (200 mg total) by mouth 3 (three) times daily. 6 tablet Margarette Canada, NP   phenazopyridine (PYRIDIUM) 200 MG tablet Take 1 tablet (200 mg total) by mouth 3 (three) times daily. 6 tablet Margarette Canada, NP      PDMP not reviewed this encounter.  Margarette Canada, NP 06/13/21 1600

## 2021-06-13 NOTE — Discharge Instructions (Addendum)
Take the Pyridium every 8 hours as needed for your urinary urgency and frequency.  Follow-up with your primary care doctor for a GYN exam to examine other causes of your urinary urgency and frequency as your urinalysis today did not reveal any evidence of infection.

## 2021-06-30 ENCOUNTER — Ambulatory Visit
Admission: EM | Admit: 2021-06-30 | Discharge: 2021-06-30 | Disposition: A | Discharge status: Home / Self Care | Payer: Medicare HMO | Attending: Family Medicine | Admitting: Family Medicine

## 2021-06-30 ENCOUNTER — Other Ambulatory Visit: Payer: Self-pay

## 2021-06-30 DIAGNOSIS — Z20822 Contact with and (suspected) exposure to covid-19: Secondary | ICD-10-CM | POA: Diagnosis present

## 2021-06-30 NOTE — ED Triage Notes (Signed)
Patient states that she was exposed to covid but is currently asymptomatic. Patient would like to be tested.

## 2021-07-01 LAB — SARS CORONAVIRUS 2 (TAT 6-24 HRS): SARS Coronavirus 2: NEGATIVE

## 2021-08-23 ENCOUNTER — Ambulatory Visit
Admission: EM | Admit: 2021-08-23 | Discharge: 2021-08-23 | Disposition: A | Discharge status: Home / Self Care | Payer: Medicare HMO | Attending: Family Medicine | Admitting: Family Medicine

## 2021-08-23 ENCOUNTER — Other Ambulatory Visit: Payer: Self-pay

## 2021-08-23 DIAGNOSIS — N3001 Acute cystitis with hematuria: Secondary | ICD-10-CM

## 2021-08-23 LAB — URINALYSIS, COMPLETE (UACMP) WITH MICROSCOPIC
Bilirubin Urine: NEGATIVE
Glucose, UA: NEGATIVE mg/dL
Ketones, ur: NEGATIVE mg/dL
Nitrite: POSITIVE — AB
Protein, ur: 30 mg/dL — AB
Specific Gravity, Urine: 1.015 (ref 1.005–1.030)
WBC, UA: >50 WBC/hpf (ref 0–5)
pH: 7.5 (ref 5.0–8.0)

## 2021-08-23 MED ORDER — NITROFURANTOIN MONOHYD MACRO 100 MG PO CAPS: 100.0000 mg | ORAL_CAPSULE | Freq: Two times a day (BID) | ORAL | 0 refills | Status: DC

## 2021-08-23 NOTE — ED Triage Notes (Signed)
Pt reports having urinary frequency and urgency since Saturday.

## 2021-08-23 NOTE — ED Provider Notes (Signed)
[facility]    CSN: [id number] Arrival date & time: 08/23/21  0943      History   Chief Complaint Chief Complaint  Patient presents with   Urinary Frequency    HPI 80 year old female presents with the above complaints.  Symptoms since Saturday.  Reports urgency and frequency.  No dysuria.  No abdominal pain.  No nausea or vomiting.  No relieving factors.  Has history of UTI.  No other complaints.  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   Hydronephrosis 09/05/2020   Increased frequency of urination 09/05/2020   Microhematuria 09/05/2020   Aerophagia 05/25/2019   Thoracic aortic atherosclerosis ([place]) 12/23/2018   Paroxysmal atrial fibrillation with rapid ventricular response ([place]) 11/10/2018   SCC (squamous cell carcinoma), scalp/neck 03/23/2016   Mild obstructive sleep apnea 06/11/2015   Lesion of bladder 11/08/2014   Atrial fibrillation ([place]) 01/20/2014   Osteoarthrosis, 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   Osteopenia 01/19/2013   Adenomatous polyp of colon 02/15/2012   OA (osteoarthritis) of knee 11/23/2011   Type 2 diabetes mellitus with microalbuminuria, without long-term current use of insulin ([place]) 09/28/2011   Type 2 diabetes mellitus with diabetic polyneuropathy, without long-term current use of insulin ([place]) 09/28/2011   GERD (gastroesophageal reflux disease) 09/28/2011   Major depressive disorder, recurrent, in partial remission ([place]) 09/28/2011   Benign essential hypertension 06/21/2011   Hypercholesterolemia 06/21/2011   Generalized anxiety disorder 06/21/2011    Past Surgical History:  Procedure Laterality Date   CYSTOSCOPY      OB History   No obstetric history on file.      Home Medications     Prior to Admission medications   Medication Sig Start Date End Date Taking? Authorizing Provider  nitrofurantoin, macrocrystal-monohydrate, (MACROBID) 100 MG capsule Take 1 capsule (100 mg total) by mouth 2 (two) times daily. 08/23/21  Yes Tanise Russman G, DO  allopurinol (ZYLOPRIM) 100 MG tablet Take 100 mg by mouth.    [provider]  ALPRAZolam Duanne Moron) 0.25 MG tablet Take by mouth. 07/25/20   [provider]  colchicine 0.6 MG tablet Take by mouth. 01/23/19   [provider]  diltiazem (CARDIZEM CD) 120 MG 24 hr capsule Take 120 mg by mouth in the morning. 05/05/21   [provider]  diltiazem (CARDIZEM CD) 180 MG 24 hr capsule Take 180 mg by mouth at bedtime. 05/17/21   [provider]  escitalopram (LEXAPRO) 5 MG tablet Take 5 mg by mouth at bedtime.     [provider]  furosemide (LASIX) 20 MG tablet Take 20 mg by mouth.    [provider]  guanFACINE (TENEX) 1 MG tablet Take 1 mg by mouth 2 (two) times daily.     [provider]  losartan (COZAAR) 100 MG tablet Take 100 mg by mouth daily.    [provider]  magnesium oxide (MAG-OX) 400 (240 Mg) MG tablet Take 1 tablet by mouth daily. 05/16/21   [provider]  magnesium oxide (MAG-OX) 400 MG tablet Take 400 mg by mouth daily.     [provider]  metFORMIN (GLUCOPHAGE-XR) 500 MG 24 hr tablet Take 1 tablet by mouth 2 (two) times daily. 03/09/21  [provider]  metFORMIN (GLUMETZA) 500 MG (MOD) 24 hr tablet Take 500 mg by mouth daily with breakfast.    [provider]  omeprazole (PRILOSEC) 20 MG capsule Take 20 mg by mouth daily.    [provider]  phenazopyridine (PYRIDIUM) 200 MG tablet Take 1 tablet (200 mg total) by mouth 3 (three) times daily. 06/13/21   Margarette Canada, NP  rivaroxaban (XARELTO) 20 MG TABS tablet Take 20 mg by mouth daily with supper.    [provider]  rosuvastatin (CRESTOR) 5 MG tablet  Take 5 mg by mouth daily. Daily on Monday and Thursday    [provider]    Family History Family History  Problem Relation Age of Onset   CVA Mother    Diabetes Mother    CVA Father     Social History Social History   Tobacco Use   Smoking status: Former    Packs/day: 0.25    Years: 20.00    Pack years: 5.00    Types: Cigarettes    Quit date: 2002    Years since quitting: 20.6   Smokeless tobacco: Never  Vaping Use   Vaping Use: Never used  Substance Use Topics   Alcohol use: No   Drug use: No     Allergies   Keflex [cephalexin], Hydrochlorothiazide w-triamterene, Lisinopril, Penicillins, and Statins   Review of Systems Review of Systems Per HPI  Physical Exam Triage Vital Signs ED Triage Vitals  Enc Vitals Group     BP 08/23/21 0954 (!) 145/66     Pulse Rate 08/23/21 0954 70     Resp 08/23/21 0954 16     Temp 08/23/21 0954 98.2 F (36.8 C)     Temp Source 08/23/21 0954 Oral     SpO2 08/23/21 0954 (!) 100 %     Weight 08/23/21 0955 138 lb (62.6 kg)     Height 08/23/21 0955 '4\' 9"'$  (1.448 m)     Head Circumference --      Peak Flow --      Pain Score 08/23/21 0955 0     Pain Loc --      Pain Edu? --      Excl. in [place]? --    Updated Vital Signs BP (!) 145/66   Pulse 70   Temp 98.2 F (36.8 C) (Oral)   Resp 16   Ht '4\' 9"'$  (1.448 m)   Wt 62.6 kg   SpO2 (!) 100%   BMI 29.86 kg/m   Visual Acuity Right Eye Distance:   Left Eye Distance:   Bilateral Distance:    Right Eye Near:   Left Eye Near:    Bilateral Near:     Physical Exam Constitutional:      General: She is not in acute distress.    Appearance: Normal appearance. She is not ill-appearing.  HENT:     Head: Normocephalic and atraumatic.  Cardiovascular:     Rate and Rhythm: Normal rate and regular rhythm.  Pulmonary:     Effort: Pulmonary effort is normal.     Breath sounds: Normal breath sounds. No wheezing or rales.  Abdominal:     General: There is no distension.      Palpations: Abdomen is soft.     Tenderness: There is no abdominal tenderness.  Neurological:     Mental Status: She is alert.  Psychiatric:        Mood and Affect: Mood normal.  Behavior: Behavior normal.     UC Treatments / Results  Labs (all labs ordered are listed, but only abnormal results are displayed) Labs Reviewed  URINALYSIS, COMPLETE (UACMP) WITH MICROSCOPIC - Abnormal; Notable for the following components:      Result Value   APPearance CLOUDY (*)    Hgb urine dipstick SMALL (*)    Protein, ur 30 (*)    Nitrite POSITIVE (*)    Leukocytes,Ua MODERATE (*)    Bacteria, UA MANY (*)    All other components within normal limits  URINE CULTURE    EKG   Radiology No results found.  Procedures Procedures (including critical care time)  Medications Ordered in UC Medications - No data to display  Initial Impression / Assessment and Plan / UC Course  I have reviewed the triage vital signs and the nursing notes.  Pertinent labs & imaging results that were available during my care of the patient were reviewed by me and considered in my medical decision making (see chart for details).    80 year old female presents with UTI.  Treated with Macrobid.   Final Clinical Impressions(s) / UC Diagnoses   Final diagnoses:  Acute cystitis with hematuria   Discharge Instructions   None    ED Prescriptions     Medication Sig Dispense Auth. Provider   nitrofurantoin, macrocrystal-monohydrate, (MACROBID) 100 MG capsule Take 1 capsule (100 mg total) by mouth 2 (two) times daily. 14 capsule Thersa Salt G, DO      PDMP not reviewed this encounter.   Coral Spikes, [place] 08/23/21 1126

## 2021-08-24 ENCOUNTER — Other Ambulatory Visit: Payer: Self-pay

## 2021-08-24 ENCOUNTER — Ambulatory Visit: Admission: EM | Admit: 2021-08-24 | Payer: Medicare HMO | Source: Home / Self Care

## 2021-08-25 ENCOUNTER — Ambulatory Visit (INDEPENDENT_AMBULATORY_CARE_PROVIDER_SITE_OTHER): Payer: Medicare HMO

## 2021-08-25 ENCOUNTER — Encounter: Payer: Self-pay | Admitting: Emergency Medicine

## 2021-08-25 ENCOUNTER — Ambulatory Visit
Admission: EM | Admit: 2021-08-25 | Discharge: 2021-08-25 | Disposition: A | Discharge status: Home / Self Care | Payer: Medicare HMO | Attending: Family Medicine | Admitting: Family Medicine

## 2021-08-25 DIAGNOSIS — R059 Cough, unspecified: Secondary | ICD-10-CM | POA: Diagnosis present

## 2021-08-25 DIAGNOSIS — Z20822 Contact with and (suspected) exposure to covid-19: Secondary | ICD-10-CM | POA: Insufficient documentation

## 2021-08-25 LAB — URINE CULTURE: Culture: >=100000 — AB

## 2021-08-25 MED ORDER — SULFAMETHOXAZOLE-TRIMETHOPRIM 800-160 MG PO TABS: 1.0000 | ORAL_TABLET | Freq: Two times a day (BID) | ORAL | 0 refills | Status: AC

## 2021-08-25 NOTE — Discharge Instructions (Signed)
Stop the macrobid.  Medication as prescribed.  COVID test should be back later today or in the morning.  Take care  Dr. Lacinda Axon

## 2021-08-25 NOTE — ED Provider Notes (Signed)
[facility]    CSN: [id number] Arrival date & time: 08/25/21  0915      History   Chief Complaint Chief Complaint  Patient presents with   Cough   HPI 80 year old female presents with the above complaint.  Recently seen by me for UTI.  Placed on [place].  Culture has revealed pansensitive E. coli.  Patient states that she developed cough yesterday.  Has had fatigue as well.  Appetite is good.  No fevers or chills.  Concern for possible COVID-19.  She has recently traveled.  No reports of shortness of breath.  No other associated symptoms.  No other complaints.  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   Hydronephrosis 09/05/2020   Increased frequency of urination 09/05/2020   Microhematuria 09/05/2020   Aerophagia 05/25/2019   Thoracic aortic atherosclerosis ([place]) 12/23/2018   Paroxysmal atrial fibrillation with rapid ventricular response ([place]) 11/10/2018   SCC (squamous cell carcinoma), scalp/neck 03/23/2016   Mild obstructive sleep apnea 06/11/2015   Lesion of bladder 11/08/2014   Atrial fibrillation ([place]) 01/20/2014   Osteoarthrosis, 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   Osteopenia 01/19/2013   Adenomatous polyp of colon 02/15/2012   OA (osteoarthritis) of knee 11/23/2011   Type 2 diabetes mellitus with microalbuminuria, without long-term current use of insulin ([place]) 09/28/2011   Type 2 diabetes mellitus with diabetic polyneuropathy, without long-term current use of insulin ([place]) 09/28/2011   GERD (gastroesophageal reflux disease) 09/28/2011   Major depressive disorder, recurrent, in partial remission ([place]) 09/28/2011   Benign essential hypertension 06/21/2011   Hypercholesterolemia 06/21/2011   Generalized anxiety  disorder 06/21/2011    Past Surgical History:  Procedure Laterality Date   CYSTOSCOPY      OB History   No obstetric history on file.      Home Medications    Prior to Admission medications   Medication Sig Start Date End Date Taking? Authorizing Provider  sulfamethoxazole-trimethoprim (BACTRIM DS) 800-160 MG tablet Take 1 tablet by mouth 2 (two) times daily for 7 days. 08/25/21 09/01/21 Yes Lucile Didonato G, DO  allopurinol (ZYLOPRIM) 100 MG tablet Take 100 mg by mouth.    [provider]  ALPRAZolam Duanne Moron) 0.25 MG tablet Take by mouth. 07/25/20   [provider]  colchicine 0.6 MG tablet Take by mouth. 01/23/19   [provider]  diltiazem (CARDIZEM CD) 120 MG 24 hr capsule Take 120 mg by mouth in the morning. 05/05/21   [provider]  diltiazem (CARDIZEM CD) 180 MG 24 hr capsule Take 180 mg by mouth at bedtime. 05/17/21   [provider]  escitalopram (LEXAPRO) 5 MG tablet Take 5 mg by mouth at bedtime.     [provider]  furosemide (LASIX) 20 MG tablet Take 20 mg by mouth.    [provider]  guanFACINE (TENEX) 1 MG tablet Take 1 mg by mouth 2 (two) times daily.     [provider]  losartan (COZAAR) 100 MG tablet Take 100 mg by mouth daily.    [provider]  magnesium oxide (MAG-OX) 400 (240 Mg) MG tablet Take 1 tablet by mouth daily. 05/16/21   [provider]  magnesium oxide (MAG-OX) 400 MG tablet  Take 400 mg by mouth daily.     [provider]  metFORMIN (GLUCOPHAGE-XR) 500 MG 24 hr tablet Take 1 tablet by mouth 2 (two) times daily. 03/09/21   [provider]  metFORMIN (GLUMETZA) 500 MG (MOD) 24 hr tablet Take 500 mg by mouth daily with breakfast.    [provider]  omeprazole (PRILOSEC) 20 MG capsule Take 20 mg by mouth daily.    [provider]  phenazopyridine (PYRIDIUM) 200 MG tablet Take 1 tablet (200 mg total) by mouth 3 (three) times daily. 06/13/21    Margarette Canada, NP  rivaroxaban (XARELTO) 20 MG TABS tablet Take 20 mg by mouth daily with supper.    [provider]  rosuvastatin (CRESTOR) 5 MG tablet Take 5 mg by mouth daily. Daily on Monday and Thursday    [provider]    Family History Family History  Problem Relation Age of Onset   CVA Mother    Diabetes Mother    CVA Father     Social History Social History   Tobacco Use   Smoking status: Former    Packs/day: 0.25    Years: 20.00    Pack years: 5.00    Types: Cigarettes    Quit date: 2002    Years since quitting: 20.7   Smokeless tobacco: Never  Vaping Use   Vaping Use: Never used  Substance Use Topics   Alcohol use: No   Drug use: No     Allergies   Keflex [cephalexin], Hydrochlorothiazide w-triamterene, Lisinopril, Penicillins, and Statins   Review of Systems Review of Systems Per HPI  Physical Exam Triage Vital Signs ED Triage Vitals  Enc Vitals Group     BP 08/25/21 0952 122/62     Pulse Rate 08/25/21 0952 68     Resp 08/25/21 0952 14     Temp 08/25/21 0952 98.3 F (36.8 C)     Temp Source 08/25/21 0952 Oral     SpO2 08/25/21 0952 95 %     Weight 08/25/21 0949 138 lb 0.1 oz (62.6 kg)     Height 08/25/21 0949 '4\' 9"'$  (1.448 m)     Head Circumference --      Peak Flow --      Pain Score 08/25/21 0949 0     Pain Loc --      Pain Edu? --      Excl. in [place]? --    Updated Vital Signs BP 122/62 (BP Location: Left Arm)   Pulse 68   Temp 98.3 F (36.8 C) (Oral)   Resp 14   Ht '4\' 9"'$  (1.448 m)   Wt 62.6 kg   SpO2 95%   BMI 29.86 kg/m   Visual Acuity Right Eye Distance:   Left Eye Distance:   Bilateral Distance:    Right Eye Near:   Left Eye Near:    Bilateral Near:     Physical Exam Constitutional:      General: She is not in acute distress.    Appearance: Normal appearance. She is not ill-appearing.  HENT:     Head: Normocephalic and atraumatic.  Eyes:     General:        Right eye: No discharge.        Left  eye: No discharge.     Conjunctiva/sclera: Conjunctivae normal.  Cardiovascular:     Rate and Rhythm: Normal rate and regular rhythm.  Pulmonary:     Effort: Pulmonary effort is normal.  Breath sounds: Rales present.  Abdominal:     General: There is no distension.     Palpations: Abdomen is soft.     Tenderness: There is no abdominal tenderness.  Neurological:     Mental Status: She is alert.  Psychiatric:        Mood and Affect: Mood normal.        Behavior: Behavior normal.     UC Treatments / Results  Labs (all labs ordered are listed, but only abnormal results are displayed) Labs Reviewed  SARS CORONAVIRUS 2 (TAT 6-24 HRS)    EKG   Radiology DG Chest 2 View  Result Date: 08/25/2021 CLINICAL DATA:  Cough EXAM: CHEST - 2 VIEW COMPARISON:  11/10/2018 FINDINGS: The heart size and mediastinal contours are within normal limits. Atherosclerotic calcification of the aortic knob. No focal airspace consolidation, pleural effusion, or pneumothorax. The visualized skeletal structures are unremarkable. IMPRESSION: No active cardiopulmonary disease. Electronically Signed   By: Davina Poke D.O.   On: 08/25/2021 11:19    Procedures Procedures (including critical care time)  Medications Ordered in UC Medications - No data to display  Initial Impression / Assessment and Plan / UC Course  I have reviewed the triage vital signs and the nursing notes.  Pertinent labs & imaging results that were available during my care of the patient were reviewed by me and considered in my medical decision making (see chart for details).    80 year old female presents with cough and fatigue.  Awaiting COVID test results.  Chest x-ray obtained and was intimately reviewed by me.  Rotation: Normal chest x-ray.  No evidence of pneumonia/infiltrate.  Stopping Macrobid as this could be playing a role (it is uncommon, but Macrobid is known to cause acute pulmonary reaction).  Starting Bactrim.   Supportive care.  Final Clinical Impressions(s) / UC Diagnoses   Final diagnoses:  Cough     Discharge Instructions      Stop the macrobid.  Medication as prescribed.  COVID test should be back later today or in the morning.  Take care  Dr. Lacinda Axon      ED Prescriptions     Medication Sig Dispense Auth. Provider   sulfamethoxazole-trimethoprim (BACTRIM DS) 800-160 MG tablet Take 1 tablet by mouth 2 (two) times daily for 7 days. 14 tablet Coral Spikes, DO      PDMP not reviewed this encounter.   Coral Spikes, DO 08/25/21 1139

## 2021-08-25 NOTE — ED Triage Notes (Signed)
Patient states that she started coughing and having some fatigue yesterday.  Patient denies fevers or chills.  Patient states that she was seen on 9/7 for UTI.  Patient states that she wants a covid test.

## 2021-08-26 LAB — SARS CORONAVIRUS 2 (TAT 6-24 HRS): SARS Coronavirus 2: NEGATIVE

## 2022-03-08 ENCOUNTER — Other Ambulatory Visit: Payer: Self-pay

## 2022-03-08 ENCOUNTER — Ambulatory Visit
Admission: EM | Admit: 2022-03-08 | Discharge: 2022-03-08 | Disposition: A | Discharge status: Home / Self Care | Payer: Medicare PPO | Attending: Physician Assistant | Admitting: Physician Assistant

## 2022-03-08 ENCOUNTER — Ambulatory Visit (INDEPENDENT_AMBULATORY_CARE_PROVIDER_SITE_OTHER): Payer: Medicare PPO

## 2022-03-08 DIAGNOSIS — Z7901 Long term (current) use of anticoagulants: Secondary | ICD-10-CM | POA: Diagnosis not present

## 2022-03-08 DIAGNOSIS — S0990XA Unspecified injury of head, initial encounter: Secondary | ICD-10-CM | POA: Diagnosis not present

## 2022-03-08 DIAGNOSIS — S0083XA Contusion of other part of head, initial encounter: Secondary | ICD-10-CM | POA: Diagnosis not present

## 2022-03-08 DIAGNOSIS — W19XXXA Unspecified fall, initial encounter: Secondary | ICD-10-CM

## 2022-03-08 NOTE — ED Triage Notes (Signed)
Patient presents to Urgent Care with complaints of a fall since 03/12, she states she was moving a planter and fell over. She states her head hit the wall. Pt reports she began developing headaches x 3 days. Pt reports she has noted headache worse at night when laying back shes unsure if related to her CPAP straps. Pt is on a blood thinner. Treating pain with tylenol.  ? ?Denies n/v, changes in vision, LOC, or dizziness.   ?

## 2022-03-08 NOTE — ED Notes (Signed)
Authorization # [id number]. ?

## 2022-03-08 NOTE — ED Provider Notes (Signed)
?[place] ? ? ? ?CSN: [id number] ?Arrival date & time: 03/08/22  1106 ? ? ?  ? ?History   ?Chief Complaint ?Chief Complaint  ?Patient presents with  ? Fall  ? Headache  ? ? ?HPI ?Vanessa Young is a 81 y.o. female presenting for continued headaches after an accidental mechanical fall 1 week ago.  Patient says that she was putting down a plan to her and she just fell forward after states that it down.  She says that her head hit a wall.  Since then she has had headaches which she says are very mild.  Also reports some bruising and swelling of her face but says the bruising and swelling has improved.  She denies ever having loss of consciousness.  Patient denies this being the worst headache she has ever had.  Denies numbness/tingling or weakness, nausea/vomiting, vision changes, dizziness.  Denies confusion, speech or balance issues.  No neck pain or stiffness.  Patient reports that she is on Xarelto reports that she has A-fib.  Other medical history stemming for diabetes, hypertension and hyper lipidemia.  Patient says she does not feel that badly but thought she should be checked out.  She has no other complaints. ? ?HPI ? ?Past Medical History:  ?Diagnosis Date  ? A-fib ([place])   ? Depression   ? Diabetes mellitus without complication ([place])   ? GERD (gastroesophageal reflux disease)   ? Hypercholesteremia   ? Hypertension   ? ? ?Patient Active Problem List  ? Diagnosis Date Noted  ? 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 Hydronephrosis 09/05/2020  ? Increased frequency of urination 09/05/2020  ? Microhematuria 09/05/2020  ? Aerophagia 05/25/2019  ? Thoracic aortic atherosclerosis ([place]) 12/23/2018  ? Paroxysmal atrial fibrillation with rapid ventricular response ([place]) 11/10/2018  ? 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 Mild obstructive sleep apnea 06/11/2015  ? Lesion of bladder 11/08/2014  ? Atrial fibrillation ([place]) 01/20/2014  ? 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 Osteopenia  01/19/2013  ? Adenomatous polyp of colon 02/15/2012  ? 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 Type 2 diabetes mellitus with microalbuminuria, without long-term current use of insulin ([place]) 09/28/2011  ? Type 2 diabetes mellitus with diabetic polyneuropathy, without long-term current use of insulin ([place]) 09/28/2011  ? GERD (gastroesophageal reflux disease) 09/28/2011  ? 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 Benign essential hypertension 06/21/2011  ? Hypercholesterolemia 06/21/2011  ? Generalized anxiety disorder 06/21/2011  ? ? ?Past Surgical History:  ?Procedure Laterality Date  ? CYSTOSCOPY    ? ? ?OB History   ?No obstetric history on file. ?  ? ? ? ?Home Medications   ? ?Prior to Admission medications   ?Medication Sig Start Date End Date Taking? Authorizing Provider  ?allopurinol (ZYLOPRIM) 100 MG tablet Take 100 mg by mouth.    [provider]  ?ALPRAZolam Duanne Moron) 0.25 MG tablet Take by mouth. 07/25/20   [provider]  ?colchicine 0.6 MG tablet Take by mouth. 01/23/19   [provider]  ?diltiazem (CARDIZEM CD) 120 MG 24 hr capsule Take 120 mg by mouth in the morning. 05/05/21   [provider]  ?diltiazem (CARDIZEM CD) 180 MG 24 hr capsule Take 180 mg by mouth at bedtime. 05/17/21   [provider]  ?escitalopram (LEXAPRO) 5 MG tablet Take 5 mg by mouth at bedtime.     [provider]  ?furosemide (LASIX) 20 MG tablet Take 20 mg by mouth.  [provider]  ?guanFACINE (TENEX) 1 MG tablet Take 1 mg by mouth 2 (two) times daily.     [provider]  ?losartan (COZAAR) 100 MG tablet Take 100 mg by mouth daily.    [provider]  ?magnesium oxide (MAG-OX) 400 (240 Mg) MG tablet Take 1 tablet by mouth daily. 05/16/21   [provider]  ?magnesium oxide (MAG-OX) 400 MG tablet Take 400 mg by mouth daily.     [provider]  ?metFORMIN (GLUCOPHAGE-XR) 500 MG 24 hr tablet Take 1  tablet by mouth 2 (two) times daily. 03/09/21   [provider]  ?metFORMIN (GLUMETZA) 500 MG (MOD) 24 hr tablet Take 500 mg by mouth daily with breakfast.    [provider]  ?omeprazole (PRILOSEC) 20 MG capsule Take 20 mg by mouth daily.    [provider]  ?phenazopyridine (PYRIDIUM) 200 MG tablet Take 1 tablet (200 mg total) by mouth 3 (three) times daily. 06/13/21   Margarette Canada, NP  ?rivaroxaban (XARELTO) 20 MG TABS tablet Take 20 mg by mouth daily with supper.    [provider]  ?rosuvastatin (CRESTOR) 5 MG tablet Take 5 mg by mouth daily. Daily on Monday and Thursday    [provider]  ? ? ?Family History ?Family History  ?Problem Relation Age of Onset  ? CVA Mother   ? Diabetes Mother   ? CVA Father   ? ? ?Social History ?Social History  ? ?Tobacco Use  ? Smoking status: Former  ?  Packs/day: 0.25  ?  Years: 20.00  ?  Pack years: 5.00  ?  Types: Cigarettes  ?  Quit date: 2002  ?  Years since quitting: 21.2  ? Smokeless tobacco: Never  ?Vaping Use  ? Vaping Use: Never used  ?Substance Use Topics  ? Alcohol use: No  ? Drug use: No  ? ? ? ?Allergies   ?Keflex [cephalexin], Hydrochlorothiazide w-triamterene, Lisinopril, Penicillins, and Statins ? ? ?Review of Systems ?Review of Systems  ?Constitutional:  Negative for fatigue.  ?Eyes:  Negative for photophobia and visual disturbance.  ?Respiratory:  Negative for shortness of breath.   ?Cardiovascular:  Negative for chest pain and palpitations.  ?Gastrointestinal:  Negative for nausea and vomiting.  ?Skin:  Positive for color change. Negative for wound.  ?Neurological:  Positive for headaches. Negative for dizziness, syncope, speech difficulty, weakness, light-headedness and numbness.  ?Hematological:  Bruises/bleeds easily.  ?Psychiatric/Behavioral:  Negative for agitation, confusion and dysphoric mood.   ? ? ?Physical Exam ?Triage Vital Signs ?ED Triage Vitals  ?Enc Vitals Group  ?   BP 03/08/22 1117 (!) 164/78  ?    Pulse Rate 03/08/22 1117 65  ?   Resp 03/08/22 1117 16  ?   Temp 03/08/22 1117 98.3 ?F (36.8 ?C)  ?   Temp Source 03/08/22 1117 Oral  ?   SpO2 03/08/22 1117 97 %  ?   Weight --   ?   Height --   ?   Head Circumference --   ?   Peak Flow --   ?   Pain Score 03/08/22 1113 5  ?   Pain Loc --   ?   Pain Edu? --   ?   Excl. in [place]? --   ? ?No data found. ? ?Updated Vital Signs ?BP (!) 164/78 (BP Location: Left Arm)   Pulse 65   Temp 98.3 ?F (36.8 ?C) (Oral)   Resp 16   SpO2 97%  ? ?   ? ?  Physical Exam ?Vitals and nursing note reviewed.  ?Constitutional:   ?   General: She is not in acute distress. ?   Appearance: Normal appearance. She is not ill-appearing or toxic-appearing.  ?HENT:  ?   Right Ear: Tympanic membrane, ear canal and external ear normal.  ?   Left Ear: Tympanic membrane, ear canal and external ear normal.  ?   Nose: Nose normal.  ?   Mouth/Throat:  ?   Mouth: Mucous membranes are moist.  ?   Pharynx: Oropharynx is clear.  ?Eyes:  ?   General: No scleral icterus.    ?   Right eye: No discharge.     ?   Left eye: No discharge.  ?   Extraocular Movements: Extraocular movements intact.  ?   Conjunctiva/sclera: Conjunctivae normal.  ?   Pupils: Pupils are equal, round, and reactive to light.  ?Cardiovascular:  ?   Rate and Rhythm: Normal rate and regular rhythm.  ?   Heart sounds: Normal heart sounds.  ?Pulmonary:  ?   Effort: Pulmonary effort is normal. No respiratory distress.  ?   Breath sounds: Normal breath sounds.  ?Musculoskeletal:  ?   Cervical back: Normal range of motion and neck supple. No tenderness.  ?Skin: ?   General: Skin is dry.  ?   Findings: Bruising (ecchymosis of upper and lower eyelids and over bilateral maxilla) present.  ?Neurological:  ?   General: No focal deficit present.  ?   Mental Status: She is alert and oriented to person, place, and time. Mental status is at baseline.  ?   Cranial Nerves: No cranial nerve deficit.  ?   Sensory: No sensory deficit.  ?   Motor: No weakness.  ?    Coordination: Coordination normal.  ?   Gait: Gait normal.  ?   Comments: 5/5 strength bilateral upper and lower extremities   ?Psychiatric:     ?   Mood and Affect: Mood normal.     ?   Behavior: Behavior no

## 2022-03-08 NOTE — Discharge Instructions (Signed)
-  Your CT scan did not show any internal bleeding.  Only shows you have swelling of your soft tissues of your face. ?- Your exam looks good today other than the bruising on your face so it is important that you continue to ice her face and take Tylenol. ?- As we discussed it is crucial that if something like this happens again you are seen right away.  If you ever have any bruising on her face after fall or ever fall and hit your head or have any headaches after a fall that you go to the emergency department immediately.  Since you are on blood thinning medications that increases your risk of bleeding.  The longer you wait, the longer you could have bleeding and the worst the outcome could be. ?- Expect to continue to have some mild headaches over the next week as your bruising and swelling go down. ?- If you have any acute worsening of your headaches, dizziness, vision changes, nausea/vomiting, weakness, numbness/tingling, difficulty speaking or walking, call 911 or have someone take to the ER. ?

## 2022-04-04 ENCOUNTER — Ambulatory Visit
Admission: RE | Admit: 2022-04-04 | Discharge: 2022-04-04 | Disposition: A | Discharge status: Home / Self Care | Payer: Medicare PPO | Source: Ambulatory Visit | Attending: Emergency Medicine | Admitting: Emergency Medicine

## 2022-04-04 VITALS — BP 152/88 | HR 74 | Temp 99.0°F | Resp 20 | Ht <= 58 in | Wt 138.0 lb

## 2022-04-04 DIAGNOSIS — I4891 Unspecified atrial fibrillation: Secondary | ICD-10-CM | POA: Diagnosis not present

## 2022-04-04 DIAGNOSIS — R051 Acute cough: Secondary | ICD-10-CM | POA: Insufficient documentation

## 2022-04-04 DIAGNOSIS — J069 Acute upper respiratory infection, unspecified: Secondary | ICD-10-CM | POA: Diagnosis not present

## 2022-04-04 DIAGNOSIS — J029 Acute pharyngitis, unspecified: Secondary | ICD-10-CM | POA: Insufficient documentation

## 2022-04-04 DIAGNOSIS — E785 Hyperlipidemia, unspecified: Secondary | ICD-10-CM | POA: Diagnosis not present

## 2022-04-04 DIAGNOSIS — Z7901 Long term (current) use of anticoagulants: Secondary | ICD-10-CM | POA: Insufficient documentation

## 2022-04-04 DIAGNOSIS — Z20822 Contact with and (suspected) exposure to covid-19: Secondary | ICD-10-CM | POA: Diagnosis not present

## 2022-04-04 DIAGNOSIS — I10 Essential (primary) hypertension: Secondary | ICD-10-CM | POA: Diagnosis not present

## 2022-04-04 DIAGNOSIS — E1142 Type 2 diabetes mellitus with diabetic polyneuropathy: Secondary | ICD-10-CM | POA: Insufficient documentation

## 2022-04-04 LAB — RESP PANEL BY RT-PCR (FLU A&B, COVID) ARPGX2
Influenza A by PCR: NEGATIVE
Influenza B by PCR: NEGATIVE
SARS Coronavirus 2 by RT PCR: NEGATIVE

## 2022-04-04 LAB — GROUP A STREP BY PCR: Group A Strep by PCR: NOT DETECTED

## 2022-04-04 MED ORDER — LIDOCAINE VISCOUS HCL 2 % MT SOLN: 15.0000 mL | OROMUCOSAL | 0 refills | Status: DC | PRN

## 2022-04-04 MED ORDER — BENZONATATE 200 MG PO CAPS: 200.0000 mg | ORAL_CAPSULE | Freq: Three times a day (TID) | ORAL | 0 refills | Status: DC | PRN

## 2022-04-04 NOTE — ED Provider Notes (Signed)
?[place] ? ? ? ?CSN: [id number] ?Arrival date & time: 04/04/22  1047 ? ? ?  ? ?History   ?Chief Complaint ?Chief Complaint  ?Patient presents with  ? Sore Throat  ?  Appt: 11 am.   ? Nasal Congestion  ? ? ?HPI ?Vanessa Young is a 81 y.o. female presenting for 2-day history of sore throat, postnasal drainage, voice hoarseness, nasal congestion without drainage and cough.  Patient denies any associated fever or increased fatigue or achiness.  Denies any sick contacts.  Patient concerned about the throat pain.  She has been taking over-the-counter ibuprofen and Tylenol which help.  Denies any associated sinus pain, chest pain, reasonable D, nausea/vomiting or diarrhea.  No sick contacts.  Personal history of hypertension, hyperlipidemia, diabetes, A-fib and is anticoagulated with Xarelto. ? ?HPI ? ?Past Medical History:  ?Diagnosis Date  ? A-fib ([place])   ? Depression   ? Diabetes mellitus without complication ([place])   ? GERD (gastroesophageal reflux disease)   ? Hypercholesteremia   ? Hypertension   ? ? ?Patient Active Problem List  ? Diagnosis Date Noted  ? 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 Hydronephrosis 09/05/2020  ? Increased frequency of urination 09/05/2020  ? Microhematuria 09/05/2020  ? Aerophagia 05/25/2019  ? Thoracic aortic atherosclerosis ([place]) 12/23/2018  ? Paroxysmal atrial fibrillation with rapid ventricular response ([place]) 11/10/2018  ? 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 OSA on CPAP 06/11/2015  ? Lesion of bladder 11/08/2014  ? Atrial fibrillation ([place]) 01/20/2014  ? 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 Osteopenia 01/19/2013  ? Adenomatous polyp of colon 02/15/2012  ? 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 Type 2 diabetes mellitus with microalbuminuria, without long-term current use of insulin ([place]) 09/28/2011  ? Type 2 diabetes mellitus with diabetic polyneuropathy, without long-term current use of insulin ([place]) 09/28/2011  ? GERD (gastroesophageal reflux  disease) 09/28/2011  ? 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 Benign essential hypertension 06/21/2011  ? Hypercholesterolemia 06/21/2011  ? Generalized anxiety disorder 06/21/2011  ? ? ?Past Surgical History:  ?Procedure Laterality Date  ? CYSTOSCOPY    ? ? ?OB History   ?No obstetric history on file. ?  ? ? ? ?Home Medications   ? ?Prior to Admission medications   ?Medication Sig Start Date End Date Taking? Authorizing Provider  ?ACCU-CHEK GUIDE test strip  01/03/22  Yes [provider]  ?allopurinol (ZYLOPRIM) 100 MG tablet Take by mouth. 09/14/21  Yes [provider]  ?ALPRAZolam (XANAX) 0.25 MG tablet Take by mouth. 02/28/22  Yes [provider]  ?benzonatate (TESSALON) 200 MG capsule Take 1 capsule (200 mg total) by mouth 3 (three) times daily as needed for cough. 04/04/22  Yes Laurene Footman B, PA-C  ?colchicine 0.6 MG tablet Take by mouth. 01/23/19  Yes [provider]  ?diltiazem (CARDIZEM CD) 180 MG 24 hr capsule Take 180 mg by mouth at bedtime. 05/17/21  Yes [provider]  ?diltiazem (TIAZAC) 180 MG 24 hr capsule Take by mouth. 09/14/21  Yes [provider]  ?escitalopram (LEXAPRO) 5 MG tablet Take 5 mg by mouth at bedtime.    Yes [provider]  ?guanFACINE (TENEX) 1 MG tablet Take 1 mg by mouth 2 (two) times daily.    Yes [provider]  ?lidocaine (XYLOCAINE) 2 % solution Use as directed 15 mLs in the mouth or throat every 3 (three) hours as needed for mouth pain (swish and spit). 04/04/22  Yes Laurene Footman B, PA-C  ?losartan (COZAAR) 100 MG tablet Take 100 mg by mouth daily.   Yes [provider]  ?magnesium oxide (MAG-OX) 400 (240 Mg) MG tablet Take 1 tablet by mouth daily. 05/16/21  Yes [provider]  ?metFORMIN (GLUCOPHAGE-XR) 500 MG 24 hr tablet Take 1 tablet by mouth 2 (two) times daily. 03/09/21  Yes [provider]  ?metFORMIN (GLUMETZA) 500 MG (MOD) 24 hr tablet  Take 500 mg by mouth daily with breakfast.   Yes [provider]  ?omeprazole (PRILOSEC) 20 MG capsule Take 20 mg by mouth daily.   Yes [provider]  ?rivaroxaban (XARELTO) 20 MG TABS tablet Take 20 mg by mouth daily with supper.   Yes [provider]  ?rosuvastatin (CRESTOR) 10 MG tablet Take by mouth. 09/14/21  Yes [provider]  ?rosuvastatin (CRESTOR) 5 MG tablet Take 5 mg by mouth daily. Daily on Monday and Thursday   Yes [provider]  ?furosemide (LASIX) 20 MG tablet Take 20 mg by mouth.    [provider]  ? ? ?Family History ?Family History  ?Problem Relation Age of Onset  ? CVA Mother   ? Diabetes Mother   ? CVA Father   ? ? ?Social History ?Social History  ? ?Tobacco Use  ? Smoking status: Former  ?  Packs/day: 0.25  ?  Years: 20.00  ?  Pack years: 5.00  ?  Types: Cigarettes  ?  Quit date: 2002  ?  Years since quitting: 21.3  ? Smokeless tobacco: Never  ?Vaping Use  ? Vaping Use: Never used  ?Substance Use Topics  ? Alcohol use: No  ? Drug use: No  ? ? ? ?Allergies   ?Keflex [cephalexin], Hydrochlorothiazide w-triamterene, Lisinopril, Penicillins, and Statins ? ? ?Review of Systems ?Review of Systems  ?Constitutional:  Negative for chills, diaphoresis, fatigue and fever.  ?HENT:  Positive for congestion, postnasal drip, sore throat and voice change. Negative for ear pain, rhinorrhea, sinus pressure and sinus pain.   ?Respiratory:  Positive for cough. Negative for shortness of breath.   ?Gastrointestinal:  Negative for abdominal pain, nausea and vomiting.  ?Musculoskeletal:  Negative for arthralgias and myalgias.  ?Skin:  Negative for rash.  ?Neurological:  Negative for weakness and headaches.  ?Hematological:  Negative for adenopathy.  ? ? ?Physical Exam ?Triage Vital Signs ?ED Triage Vitals  ?Enc Vitals Group  ?   BP 04/04/22 1125 (!) 152/88  ?   Pulse Rate 04/04/22 1125 74  ?   Resp 04/04/22 1125 20  ?   Temp 04/04/22 1125 99 ?F (37.2 ?C)  ?    Temp Source 04/04/22 1125 Oral  ?   SpO2 04/04/22 1125 95 %  ?   Weight 04/04/22 1122 138 lb 0.1 oz (62.6 kg)  ?   Height 04/04/22 1122 '4\' 9"'$  (1.448 m)  ?   Head Circumference --   ?   Peak Flow --   ?   Pain Score 04/04/22 1121 8  ?   Pain Loc --   ?   Pain Edu? --   ?   Excl. in [place]? --   ? ?No data found. ? ?Updated Vital Signs ?BP (!) 152/88 (BP Location: Left Arm)   Pulse 74   Temp 99 ?F (37.2 ?C) (Oral)   Resp 20   Ht '4\' 9"'$  (1.448 m)   Wt 138 lb 0.1 oz (62.6 kg)   SpO2 95%   BMI 29.86 kg/m?  ? ? ?  Physical Exam ?Vitals and nursing note reviewed.  ?Constitutional:   ?   General: She is not in acute distress. ?   Appearance: Normal appearance. She is not ill-appearing or toxic-appearing.  ?   Comments: +voice hoarseness  ?HENT:  ?   Head: Normocephalic and atraumatic.  ?   Right Ear: Tympanic membrane, ear canal and external ear normal.  ?   Left Ear: Tympanic membrane, ear canal and external ear normal.  ?   Nose: Congestion present.  ?   Mouth/Throat:  ?   Mouth: Mucous membranes are moist.  ?   Pharynx: Oropharynx is clear. Posterior oropharyngeal erythema present.  ?Eyes:  ?   General: No scleral icterus.    ?   Right eye: No discharge.     ?   Left eye: No discharge.  ?   Conjunctiva/sclera: Conjunctivae normal.  ?Cardiovascular:  ?   Rate and Rhythm: Normal rate and regular rhythm.  ?   Heart sounds: Normal heart sounds.  ?Pulmonary:  ?   Effort: Pulmonary effort is normal. No respiratory distress.  ?   Breath sounds: Normal breath sounds.  ?Musculoskeletal:  ?   Cervical back: Neck supple.  ?Skin: ?   General: Skin is dry.  ?Neurological:  ?   General: No focal deficit present.  ?   Mental Status: She is alert. Mental status is at baseline.  ?   Motor: No weakness.  ?   Gait: Gait normal.  ?Psychiatric:     ?   Mood and Affect: Mood normal.     ?   Behavior: Behavior normal.     ?   Thought Content: Thought content normal.  ? ? ? ?UC Treatments / Results  ?Labs ?(all labs ordered are listed, but only  abnormal results are displayed) ?Labs Reviewed  ?GROUP A STREP BY PCR  ?RESP PANEL BY RT-PCR (FLU A&B, COVID) ARPGX2  ? ? ?EKG ? ? ?Radiology ?No results found. ? ?Procedures ?Procedures (including critical care

## 2022-04-04 NOTE — Discharge Instructions (Addendum)
-  Strep test is negative ?- We are testing for COVID and flu.  We will call if results are positive.  If you are positive I can send antiviral medicine for you.  You would need to isolate 5 days and wear a mask for 5 days.  If you do not hear from me, tests are negative and this is likely another viral illness.  Increase rest and fluids.  I have sent cough medicine and viscous lidocaine.  You can also use over-the-counter Chloraseptic spray and throat lozenges. ?- The voice hoarseness should improve over the next several days.  Try to rest your voice to make sure to stay hydrated. ?- You should be seen again if you develop a fever, worsening cough, breathing difficulty or any worsening of your symptoms.  Most viral illnesses get better within 7 to 10 days. ?

## 2022-04-04 NOTE — ED Triage Notes (Signed)
Patient is here for "horrible sorethroat" and "congestion". Symptoms began "Sunday night". Throat hurts "real bad". No cough. No sob. No chest "pain".  ?

## 2022-04-23 ENCOUNTER — Ambulatory Visit
Admission: EM | Admit: 2022-04-23 | Discharge: 2022-04-23 | Disposition: A | Discharge status: Home / Self Care | Payer: Medicare PPO | Attending: Internal Medicine | Admitting: Internal Medicine

## 2022-04-23 ENCOUNTER — Ambulatory Visit (INDEPENDENT_AMBULATORY_CARE_PROVIDER_SITE_OTHER): Payer: Medicare PPO

## 2022-04-23 ENCOUNTER — Other Ambulatory Visit: Payer: Self-pay

## 2022-04-23 DIAGNOSIS — M7918 Myalgia, other site: Secondary | ICD-10-CM | POA: Diagnosis not present

## 2022-04-23 DIAGNOSIS — M546 Pain in thoracic spine: Secondary | ICD-10-CM | POA: Diagnosis not present

## 2022-04-23 DIAGNOSIS — M47812 Spondylosis without myelopathy or radiculopathy, cervical region: Secondary | ICD-10-CM

## 2022-04-23 DIAGNOSIS — R079 Chest pain, unspecified: Secondary | ICD-10-CM

## 2022-04-23 DIAGNOSIS — M542 Cervicalgia: Secondary | ICD-10-CM | POA: Diagnosis not present

## 2022-04-23 MED ORDER — BACLOFEN 10 MG PO TABS: 5.0000 mg | ORAL_TABLET | Freq: Three times a day (TID) | ORAL | 0 refills | Status: DC

## 2022-04-23 MED ORDER — TRAMADOL HCL 50 MG PO TABS: 50.0000 mg | ORAL_TABLET | Freq: Four times a day (QID) | ORAL | 0 refills | Status: DC | PRN

## 2022-04-23 NOTE — ED Triage Notes (Addendum)
Pt states she did something to her left scapular/shoulder region last week. (Thinks maybe while exercising last Monday). Unable to sleep due to the discomfort. Has ROM but area is tender to touch. Pt states her blood pressure has been high all week.  ?

## 2022-04-23 NOTE — Discharge Instructions (Addendum)
You need to follow up with your [facility] doctor or spine orthopedic specialist to check for pinch nerve since your neck xray is showing narrowing of the nerve holes, and this could be causing the pain on your L shoulder blade area.  ? ?Apply heat on the area of pain, and you may lean against a tennis ball and massage this area.  ? ? ?

## 2022-04-23 NOTE — ED Provider Notes (Signed)
?[place] ? ? ? ?CSN: [id number] ?Arrival date & time: 04/23/22  0840 ? ? ?  ? ?History   ?Chief Complaint ?Chief Complaint  ?Patient presents with  ? Shoulder Pain  ? ? ?HPI ?Vanessa Young is a 81 y.o. female who presents with L rhomboid area of pain since after doing balance exercises one week ago. She started feeling it during the exercises, but felt worse the next day. The pain is constant, provoked with sitting, leaning back, or laying down. Does not hurt to move her arms, or staying active. Has local tenderness on this area. Denies hx of disc problems. Denies paresthesia to her arm. Has pain on the upper thoracic spine region. Has some oxycodone and took a couple of them and helped for a short time.  ?She states she had a fall about 7 weeks ago, and had CT of her head, but her neck was not bothering her then.  ? ? ? ?Past Medical History:  ?Diagnosis Date  ? A-fib ([place])   ? Depression   ? Diabetes mellitus without complication ([place])   ? GERD (gastroesophageal reflux disease)   ? Hypercholesteremia   ? Hypertension   ? ? ?Patient Active Problem List  ? Diagnosis Date Noted  ? 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 Hydronephrosis 09/05/2020  ? Increased frequency of urination 09/05/2020  ? Microhematuria 09/05/2020  ? Aerophagia 05/25/2019  ? Thoracic aortic atherosclerosis ([place]) 12/23/2018  ? Paroxysmal atrial fibrillation with rapid ventricular response ([place]) 11/10/2018  ? 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 OSA on CPAP 06/11/2015  ? Lesion of bladder 11/08/2014  ? Atrial fibrillation ([place]) 01/20/2014  ? 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 Osteopenia 01/19/2013  ? Adenomatous polyp of colon 02/15/2012  ? 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 Type 2 diabetes mellitus with microalbuminuria, without long-term current use of insulin ([place]) 09/28/2011  ? Type 2 diabetes mellitus with diabetic polyneuropathy, without long-term current use of insulin ([place]) 09/28/2011  ? GERD  (gastroesophageal reflux disease) 09/28/2011  ? 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 Benign essential hypertension 06/21/2011  ? Hypercholesterolemia 06/21/2011  ? Generalized anxiety disorder 06/21/2011  ? ? ?Past Surgical History:  ?Procedure Laterality Date  ? CYSTOSCOPY    ? ? ?OB History   ?No obstetric history on file. ?  ? ? ? ?Home Medications   ? ?Prior to Admission medications   ?Medication Sig Start Date End Date Taking? Authorizing Provider  ?baclofen (LIORESAL) 10 MG tablet Take 0.5 tablets (5 mg total) by mouth 3 (three) times daily. 04/23/22  Yes Rodriguez-Southworth, Sunday Spillers, PA-C  ?traMADol (ULTRAM) 50 MG tablet Take 1 tablet (50 mg total) by mouth every 6 (six) hours as needed. 04/23/22  Yes Rodriguez-Southworth, Sunday Spillers, PA-C  ?ACCU-CHEK GUIDE test strip  01/03/22   [provider]  ?allopurinol (ZYLOPRIM) 100 MG tablet Take by mouth. 09/14/21   [provider]  ?ALPRAZolam Duanne Moron) 0.25 MG tablet Take by mouth. 02/28/22   [provider]  ?colchicine 0.6 MG tablet Take by mouth. 01/23/19   [provider]  ?diltiazem (CARDIZEM CD) 180 MG 24 hr capsule Take 180 mg by mouth at bedtime. 05/17/21   [provider]  ?diltiazem (TIAZAC) 180 MG 24 hr capsule Take by mouth. 09/14/21   [provider]  ?escitalopram (LEXAPRO) 5 MG tablet Take 5 mg by mouth at bedtime.     [provider]  ?guanFACINE (TENEX) 1 MG tablet  Take 1 mg by mouth 2 (two) times daily.     [provider]  ?losartan (COZAAR) 100 MG tablet Take 100 mg by mouth daily.    [provider]  ?magnesium oxide (MAG-OX) 400 (240 Mg) MG tablet Take 1 tablet by mouth daily. 05/16/21   [provider]  ?metFORMIN (GLUCOPHAGE-XR) 500 MG 24 hr tablet Take 1 tablet by mouth 2 (two) times daily. 03/09/21   [provider]  ?metFORMIN (GLUMETZA) 500 MG (MOD) 24 hr tablet Take 500 mg by mouth daily with breakfast.    [provider]  ?rivaroxaban (XARELTO) 20 MG TABS tablet Take 20 mg by mouth daily with supper.    [provider]  ?rosuvastatin (CRESTOR) 5 MG tablet Take 5 mg by mouth daily. Daily on Monday and Thursday    [provider]  ? ? ?Family History ?Family History  ?Problem Relation Age of Onset  ? CVA Mother   ? Diabetes Mother   ? CVA Father   ? ? ?Social History ?Social History  ? ?Tobacco Use  ? Smoking status: Former  ?  Packs/day: 0.25  ?  Years: 20.00  ?  Pack years: 5.00  ?  Types: Cigarettes  ?  Quit date: 2002  ?  Years since quitting: 21.3  ? Smokeless tobacco: Never  ?Vaping Use  ? Vaping Use: Never used  ?Substance Use Topics  ? Alcohol use: No  ? Drug use: No  ? ? ? ?Allergies   ?Keflex [cephalexin], Hydrochlorothiazide w-triamterene, Lisinopril, Penicillins, and Statins ? ? ?Review of Systems ?Review of Systems  ?HENT:  Negative for tinnitus.   ?Respiratory:  Negative for chest tightness and shortness of breath.   ?Cardiovascular:  Negative for chest pain.  ?     Her BP has been elevated this past week. Has apt with her cardiologist tomorrow am  ?Musculoskeletal:  Positive for neck pain. Negative for gait problem and neck stiffness.  ?Skin:  Negative for rash.  ?Neurological:  Negative for weakness, numbness and headaches.  ?     Undergoing PT to improve her balance  ?Psychiatric/Behavioral:  Negative for confusion and sleep disturbance.   ? ? ?Physical Exam ?Triage Vital Signs ?ED Triage Vitals  ?Enc Vitals Group  ?   BP 04/23/22 0901 (!) 193/97  ?   Pulse Rate 04/23/22 0901 89  ?   Resp 04/23/22 0901 17  ?   Temp 04/23/22 0901 98.4 ?F (36.9 ?C)  ?   Temp Source 04/23/22 0901 Oral  ?   SpO2 04/23/22 0901 97 %  ?   Weight 04/23/22 0857 134 lb (60.8 kg)  ?   Height 04/23/22 0857 '4\' 9"'$  (1.448 m)  ?   Head Circumference --   ?   Peak Flow --   ?   Pain Score 04/23/22 0857 10  ?   Pain Loc --   ?   Pain Edu? --   ?   Excl. in [place]? --   ? ?No data found. ? ?Updated Vital Signs ?BP (!)  202/101 (BP Location: Right Arm)   Pulse 89   Temp 98.4 ?F (36.9 ?C) (Oral)   Resp 17   Ht '4\' 9"'$  (1.448 m)   Wt 134 lb (60.8 kg)   SpO2 97%   BMI 29.00 kg/m?  ? ?Visual Acuity ?Right Eye Distance:   ?Left Eye Distance:   ?Bilateral Distance:   ? ?Right Eye Near:   ?Left Eye Near:    ?  Bilateral Near:    ? ?Physical Exam ?Vitals and nursing note reviewed.  ?Neck:  ?   Comments: Has stiffness with ROM, has point tenderness on C5-T2. Her L rhomboid is tense and tender as well. ? ?Musculoskeletal:     ?   General: Normal range of motion.  ?Neurological:  ?   Mental Status: She is alert and oriented to person, place, and time.  ?   Gait: Gait normal.  ?   Deep Tendon Reflexes: Reflexes normal.  ?Psychiatric:     ?   Mood and Affect: Mood normal.     ?   Behavior: Behavior normal.     ?   Thought Content: Thought content normal.     ?   Judgment: Judgment normal.  ? ? ? ?UC Treatments / Results  ?Labs ?(all labs ordered are listed, but only abnormal results are displayed) ?Labs Reviewed - No data to display ? ?EKG ? ? ?Radiology ?DG Chest 2 View ? ?Result Date: 04/23/2022 ?CLINICAL DATA:  point tender on C5-T2, pain L thoracic area worse when laying down EXAM: CHEST - 2 VIEW COMPARISON:  CT 12/04/2018 FINDINGS: Unchanged cardiomediastinal silhouette. There is no focal airspace consolidation. There is no pleural effusion. No pneumothorax. There is no acute osseous abnormality. Unchanged chronic compression deformities of T2 and T3. There is no evidence of acute fracture. IMPRESSION: No evidence of acute cardiopulmonary disease. Unchanged chronic compression deformities of T2 and T3. Electronically Signed   By: Maurine Simmering M.D.   On: 04/23/2022 10:11  ? ?DG Cervical Spine Complete ? ?Result Date: 04/23/2022 ?CLINICAL DATA:  Pain at C5-T2 especially on LEFT, question injury last week EXAM: CERVICAL SPINE - COMPLETE 4+ VIEW COMPARISON:  None FINDINGS: Osseous demineralization. Multilevel facet degenerative changes.  Vertebral body heights maintained. Diffuse disc space narrowing with bulky anterior endplate spurs at I4-P3 and C6-C7. 3.4 mm of anterolisthesis at C5-C6. No fracture, additional subluxation, or bone destruction. Prevertebral

## 2022-04-30 ENCOUNTER — Ambulatory Visit
Admission: EM | Admit: 2022-04-30 | Discharge: 2022-04-30 | Disposition: A | Discharge status: Home / Self Care | Payer: Medicare PPO | Attending: Emergency Medicine | Admitting: Emergency Medicine

## 2022-04-30 DIAGNOSIS — R350 Frequency of micturition: Secondary | ICD-10-CM | POA: Insufficient documentation

## 2022-04-30 DIAGNOSIS — E860 Dehydration: Secondary | ICD-10-CM | POA: Diagnosis not present

## 2022-04-30 DIAGNOSIS — M545 Low back pain, unspecified: Secondary | ICD-10-CM | POA: Insufficient documentation

## 2022-04-30 LAB — GLUCOSE, CAPILLARY: Glucose-Capillary: 224 mg/dL — ABNORMAL HIGH (ref 70–99)

## 2022-04-30 LAB — BASIC METABOLIC PANEL
Anion gap: 12 (ref 5–15)
BUN: 14 mg/dL (ref 8–23)
CO2: 27 mmol/L (ref 22–32)
Calcium: 9.4 mg/dL (ref 8.9–10.3)
Chloride: 98 mmol/L (ref 98–111)
Creatinine, Ser: 0.87 mg/dL (ref 0.44–1.00)
GFR, Estimated: >60 mL/min (ref >60)
Glucose, Bld: 198 mg/dL — ABNORMAL HIGH (ref 70–99)
Potassium: 4.3 mmol/L (ref 3.5–5.1)
Sodium: 137 mmol/L (ref 135–145)

## 2022-04-30 MED ORDER — SODIUM CHLORIDE 0.9 % IV BOLUS
500.0000 mL | Freq: Once | INTRAVENOUS | Status: AC
  Administered 2022-04-30: 500 mL via INTRAVENOUS

## 2022-04-30 NOTE — ED Triage Notes (Signed)
Patient is here for "Shakiness" with legs with ha. Started yesterday. "Having a lot of back pain the last 2 wks also". The back pain is "recurrent". "No sleep last 2 wks". Concerns with dehydration. Last void "earlier, voids normal". Stools "normal". No nausea/vomiting.  ?

## 2022-04-30 NOTE — ED Provider Notes (Signed)
?[place] [facility] ? ? ? ?CSN: [id number] ?Arrival date & time: 04/30/22  0803 ? ? ?  ? ?History   ?Chief Complaint ?Chief Complaint  ?Patient presents with  ? Shaking  ? ? ?HPI ?Vanessa Young is a 81 y.o. female.  ? ?HPI ? ?81 year old female here for evaluation of shakiness. ? ?Patient is with her granddaughter for shakiness in her legs and headache that started yesterday and then worsened this morning.  Patient reports that she has been experiencing back pain for the last 2 weeks and has been impacting her sleep.  She does have a history of poor sleep at baseline and will on occasion use Xanax for sleep.  She also has a history of unsteadiness for which she is seeing [facility] and has been referred to physical therapy.  She also refers appointment this morning but rescheduled it due to feeling poorly.  She was evaluated in this urgent care on 5 8 and diagnosed with rhomboid muscle pain.  She was prescribed tramadol and baclofen.  She states that she has not taken the baclofen because she does not like how it made her feel.  She had been taking the tramadol but has not had any since last night.  She also has a history of poor oral fluid intake and dehydration.  She states she feels like she is dehydrated.  She denies any dry mouth, syncope, dizziness, or chest pain.  She has had a goal of 60 ounces of water daily which she has not been needing.  Her daughter has purchased her a light a water bottle to remind her to drink but the patient reports that the batteries are out. ? ?Past Medical History:  ?Diagnosis Date  ? A-fib ([place])   ? Depression   ? Diabetes mellitus without complication ([place])   ? GERD (gastroesophageal reflux disease)   ? Hypercholesteremia   ? Hypertension   ? ? ?Patient Active Problem List  ? Diagnosis Date Noted  ? 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 Hydronephrosis 09/05/2020  ? Increased frequency of urination 09/05/2020  ? Microhematuria 09/05/2020  ? Aerophagia 05/25/2019  ?  Thoracic aortic atherosclerosis ([place]) 12/23/2018  ? Paroxysmal atrial fibrillation with rapid ventricular response ([place]) 11/10/2018  ? 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 OSA on CPAP 06/11/2015  ? Lesion of bladder 11/08/2014  ? Atrial fibrillation ([place]) 01/20/2014  ? 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 Osteopenia 01/19/2013  ? Adenomatous polyp of colon 02/15/2012  ? 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 Type 2 diabetes mellitus with microalbuminuria, without long-term current use of insulin ([place]) 09/28/2011  ? Type 2 diabetes mellitus with diabetic polyneuropathy, without long-term current use of insulin ([place]) 09/28/2011  ? GERD (gastroesophageal reflux disease) 09/28/2011  ? Major depressive disorder, recurrent, in partial remission ([place]) 09/28/2011  ? Benign essential hypertension 06/21/2011  ? Hypercholesterolemia 06/21/2011  ? Generalized anxiety disorder 06/21/2011  ? ? ?Past Surgical History:  ?Procedure Laterality Date  ? CYSTOSCOPY    ? ? ?OB History   ?No obstetric history on file. ?  ? ? ? ?Home Medications   ? ?Prior to Admission medications   ?Medication Sig Start Date End Date Taking? Authorizing Provider  ?ACCU-CHEK GUIDE test strip  01/03/22   [provider]  ?allopurinol (ZYLOPRIM) 100 MG tablet Take by mouth. 09/14/21   [provider]  ?ALPRAZolam Duanne Moron) 0.25 MG tablet Take by mouth. 02/28/22   [provider]  ?baclofen (LIORESAL) 10 MG  tablet Take 0.5 tablets (5 mg total) by mouth 3 (three) times daily. 04/23/22   Rodriguez-Southworth, Sunday Spillers, PA-C  ?colchicine 0.6 MG tablet Take by mouth. 01/23/19   [provider]  ?diltiazem (CARDIZEM CD) 180 MG 24 hr capsule Take 180 mg by mouth at bedtime. 05/17/21   [provider]  ?diltiazem (TIAZAC) 180 MG 24 hr capsule Take by mouth. 09/14/21   [provider]  ?escitalopram (LEXAPRO) 5 MG tablet Take 5 mg by mouth at bedtime.     [provider]  ?guanFACINE (TENEX) 1 MG  tablet Take 1 mg by mouth 2 (two) times daily.     [provider]  ?losartan (COZAAR) 100 MG tablet Take 100 mg by mouth daily.    [provider]  ?magnesium oxide (MAG-OX) 400 (240 Mg) MG tablet Take 1 tablet by mouth daily. 05/16/21   [provider]  ?metFORMIN (GLUCOPHAGE-XR) 500 MG 24 hr tablet Take 1 tablet by mouth 2 (two) times daily. 03/09/21   [provider]  ?metFORMIN (GLUMETZA) 500 MG (MOD) 24 hr tablet Take 500 mg by mouth daily with breakfast.    [provider]  ?rivaroxaban (XARELTO) 20 MG TABS tablet Take 20 mg by mouth daily with supper.    [provider]  ?rosuvastatin (CRESTOR) 5 MG tablet Take 5 mg by mouth daily. Daily on Monday and Thursday    [provider]  ?traMADol (ULTRAM) 50 MG tablet Take 1 tablet (50 mg total) by mouth every 6 (six) hours as needed. 04/23/22   Rodriguez-Southworth, Sunday Spillers, PA-C  ? ? ?Family History ?Family History  ?Problem Relation Age of Onset  ? CVA Mother   ? Diabetes Mother   ? CVA Father   ? ? ?Social History ?Social History  ? ?Tobacco Use  ? Smoking status: Former  ?  Packs/day: 0.25  ?  Years: 20.00  ?  Pack years: 5.00  ?  Types: Cigarettes  ?  Quit date: 2002  ?  Years since quitting: 21.3  ? Smokeless tobacco: Never  ?Vaping Use  ? Vaping Use: Never used  ?Substance Use Topics  ? Alcohol use: No  ? Drug use: No  ? ? ? ?Allergies   ?Keflex [cephalexin], Hydrochlorothiazide w-triamterene, Lisinopril, Penicillins, and Statins ? ? ?Review of Systems ?Review of Systems  ?Cardiovascular:  Negative for chest pain.  ?Musculoskeletal:  Positive for back pain.  ?Neurological:  Positive for headaches. Negative for dizziness, syncope, facial asymmetry and speech difficulty.  ?Hematological: Negative.   ?Psychiatric/Behavioral: Negative.    ? ? ?Physical Exam ?Triage Vital Signs ?ED Triage Vitals  ?Enc Vitals Group  ?   BP --   ?   Pulse --   ?   Resp --   ?   Temp --   ?   Temp src --   ?   SpO2 --   ?    Weight 04/30/22 0809 134 lb 0.6 oz (60.8 kg)  ?   Height 04/30/22 0809 '4\' 9"'$  (1.448 m)  ?   Head Circumference --   ?   Peak Flow --   ?   Pain Score 04/30/22 0806 0  ?   Pain Loc --   ?   Pain Edu? --   ?   Excl. in [place]? --   ? ?No data found. ? ?Updated Vital Signs ?BP (!) 193/105 (BP Location: Left Arm)   Pulse (!) 107   Temp 98.2 ?F (36.8 ?C) (Oral)  Resp 20   Ht '4\' 9"'$  (1.448 m)   Wt 134 lb 0.6 oz (60.8 kg)   SpO2 99%   BMI 29.01 kg/m?  ? ?Visual Acuity ?Right Eye Distance:   ?Left Eye Distance:   ?Bilateral Distance:   ? ?Right Eye Near:   ?Left Eye Near:    ?Bilateral Near:    ? ?Physical Exam ?Vitals and nursing note reviewed.  ?Constitutional:   ?   Appearance: Normal appearance. She is not ill-appearing.  ?HENT:  ?   Head: Normocephalic and atraumatic.  ?Cardiovascular:  ?   Rate and Rhythm: Normal rate and regular rhythm.  ?   Pulses: Normal pulses.  ?   Heart sounds: Normal heart sounds. No murmur heard. ?  No friction rub. No gallop.  ?Pulmonary:  ?   Effort: Pulmonary effort is normal.  ?   Breath sounds: Normal breath sounds. No wheezing, rhonchi or rales.  ?Skin: ?   General: Skin is warm and dry.  ?   Capillary Refill: Capillary refill takes less than 2 seconds.  ?   Findings: No erythema or rash.  ?Neurological:  ?   General: No focal deficit present.  ?   Mental Status: She is alert and oriented to person, place, and time.  ?   Motor: No weakness.  ?Psychiatric:     ?   Mood and Affect: Mood normal.     ?   Behavior: Behavior normal.     ?   Thought Content: Thought content normal.     ?   Judgment: Judgment normal.  ? ? ? ?UC Treatments / Results  ?Labs ?(all labs ordered are listed, but only abnormal results are displayed) ?Labs Reviewed  ?GLUCOSE, CAPILLARY - Abnormal; Notable for the following components:  ?    Result Value  ? Glucose-Capillary 224 (*)   ? All other components within normal limits  ?BASIC METABOLIC PANEL - Abnormal; Notable for the following components:  ? Glucose, Bld  198 (*)   ? All other components within normal limits  ?CBG MONITORING, ED  ? ? ?EKG ? ? ?Radiology ?No results found. ? ?Procedures ?Procedures (including critical care time) ? ?Medications Ordered in UC ?Medic

## 2022-04-30 NOTE — Discharge Instructions (Addendum)
Go home and continue to drink water by mouth and still shoot for a goal of 60 ounces of water daily. ? ?For your back pain try utilizing the baclofen 3 times a day.  Do not use this with the tramadol as a believe the tramadol was contributing to you feeling shaky. ? ?As far as her physical activity I will just do your activities of daily living and avoid any extra exertion until after you have been evaluated by physical therapy. ? ?Return for reevaluation, or see your primary care provider, for any continued or worsening symptoms. ?

## 2022-11-16 DIAGNOSIS — Z955 Presence of coronary angioplasty implant and graft: Secondary | ICD-10-CM

## 2022-11-16 DIAGNOSIS — G459 Transient cerebral ischemic attack, unspecified: Secondary | ICD-10-CM

## 2022-11-16 HISTORY — DX: Presence of coronary angioplasty implant and graft: Z95.5

## 2022-11-16 HISTORY — DX: Transient cerebral ischemic attack, unspecified: G45.9

## 2022-11-18 ENCOUNTER — Ambulatory Visit
Admission: EM | Admit: 2022-11-18 | Discharge: 2022-11-18 | Disposition: A | Discharge status: Home / Self Care | Payer: Medicare PPO | Source: Home / Self Care | Attending: Family Medicine | Admitting: Family Medicine

## 2022-11-18 DIAGNOSIS — R002 Palpitations: Secondary | ICD-10-CM | POA: Insufficient documentation

## 2022-11-18 DIAGNOSIS — I2111 ST elevation (STEMI) myocardial infarction involving right coronary artery: Secondary | ICD-10-CM | POA: Diagnosis not present

## 2022-11-18 DIAGNOSIS — E860 Dehydration: Secondary | ICD-10-CM | POA: Insufficient documentation

## 2022-11-18 DIAGNOSIS — I213 ST elevation (STEMI) myocardial infarction of unspecified site: Secondary | ICD-10-CM | POA: Diagnosis not present

## 2022-11-18 LAB — CBC WITH DIFFERENTIAL/PLATELET
Abs Immature Granulocytes: 0.06 10*3/uL (ref 0.00–0.07)
Basophils Absolute: 0.1 10*3/uL (ref 0.0–0.1)
Basophils Relative: 1 %
Eosinophils Absolute: 0.1 10*3/uL (ref 0.0–0.5)
Eosinophils Relative: 1 %
HCT: 49.3 % — ABNORMAL HIGH (ref 36.0–46.0)
Hemoglobin: 16.2 g/dL — ABNORMAL HIGH (ref 12.0–15.0)
Immature Granulocytes: 1 %
Lymphocytes Relative: 11 %
Lymphs Abs: 1.2 10*3/uL (ref 0.7–4.0)
MCH: 29.1 pg (ref 26.0–34.0)
MCHC: 32.9 g/dL (ref 30.0–36.0)
MCV: 88.7 fL (ref 80.0–100.0)
Monocytes Absolute: 0.6 10*3/uL (ref 0.1–1.0)
Monocytes Relative: 6 %
Neutro Abs: 9 10*3/uL — ABNORMAL HIGH (ref 1.7–7.7)
Neutrophils Relative %: 80 %
Platelets: 273 10*3/uL (ref 150–400)
RBC: 5.56 MIL/uL — ABNORMAL HIGH (ref 3.87–5.11)
RDW: 13.3 % (ref 11.5–15.5)
WBC: 11 10*3/uL — ABNORMAL HIGH (ref 4.0–10.5)
nRBC: 0 % (ref 0.0–0.2)

## 2022-11-18 LAB — TROPONIN I (HIGH SENSITIVITY): Troponin I (High Sensitivity): 10 ng/L (ref <18)

## 2022-11-18 LAB — BASIC METABOLIC PANEL
Anion gap: 12 (ref 5–15)
BUN: 22 mg/dL (ref 8–23)
CO2: 27 mmol/L (ref 22–32)
Calcium: 10.2 mg/dL (ref 8.9–10.3)
Chloride: 100 mmol/L (ref 98–111)
Creatinine, Ser: 1 mg/dL (ref 0.44–1.00)
GFR, Estimated: 56 mL/min — ABNORMAL LOW (ref >60)
Glucose, Bld: 178 mg/dL — ABNORMAL HIGH (ref 70–99)
Potassium: 4.8 mmol/L (ref 3.5–5.1)
Sodium: 139 mmol/L (ref 135–145)

## 2022-11-18 MED ORDER — SODIUM CHLORIDE 0.9 % IV BOLUS
500.0000 mL | Freq: Once | INTRAVENOUS | Status: AC
  Administered 2022-11-18: 500 mL via INTRAVENOUS

## 2022-11-18 NOTE — ED Provider Notes (Signed)
[facility]    CSN: [id number] Arrival date & time: 11/18/22  4158      History   Chief Complaint Chief Complaint  Patient presents with   Headache   Fatigue    HPI Vanessa Young is a 81 y.o. female.   HPI   Vanessa Young presents for palpitations, posterior headache and fatigue. She started she is dehydrated and gets AFIB spills. Has been having headache and belching. Uses a CPAP machine.  Feels like her legs are like jelly. Feels somewhat unstable which occurs when she is dehydrated. She tries her best to drink a lot of water but hasn't in the last couple of days.  Her granddaughter told her to go and be seen.  No one has been sick around her.  Of note, she has not taken her medications this morning.  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   Hydronephrosis 09/05/2020   Increased frequency of urination 09/05/2020   Microhematuria 09/05/2020   Aerophagia 05/25/2019   Thoracic aortic atherosclerosis ([place]) 12/23/2018   Paroxysmal atrial fibrillation with rapid ventricular response ([place]) 11/10/2018   SCC (squamous cell carcinoma), scalp/neck 03/23/2016   OSA on CPAP 06/11/2015   Lesion of bladder 11/08/2014   Atrial fibrillation ([place]) 01/20/2014   Osteoarthrosis, 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   Osteopenia 01/19/2013   Adenomatous polyp of colon 02/15/2012   OA (osteoarthritis) of knee 11/23/2011   Type 2 diabetes mellitus with microalbuminuria, without long-term  current use of insulin ([place]) 09/28/2011   Type 2 diabetes mellitus with diabetic polyneuropathy, without long-term current use of insulin ([place]) 09/28/2011   GERD (gastroesophageal reflux disease) 09/28/2011   Major depressive disorder, recurrent, in partial remission ([place]) 09/28/2011   Benign essential hypertension 06/21/2011   Hypercholesterolemia 06/21/2011   Generalized anxiety disorder 06/21/2011    Past Surgical History:  Procedure Laterality Date   CYSTOSCOPY      OB History   No obstetric history on file.      Home Medications    Prior to Admission medications   Medication Sig Start Date End Date Taking? Authorizing Provider  ACCU-CHEK GUIDE test strip  01/03/22  Yes [provider]  ALPRAZolam Duanne Moron) 0.25 MG tablet Take by mouth. 02/28/22  Yes [provider]  diltiazem (CARDIZEM CD) 180 MG 24 hr capsule Take 180 mg by mouth at bedtime. 05/17/21  Yes [provider]  diltiazem (TIAZAC) 180 MG 24 hr capsule Take by mouth. 09/14/21  Yes [provider]  escitalopram (LEXAPRO) 5 MG tablet Take 5 mg by mouth at bedtime.    Yes [provider]  guanFACINE (TENEX) 1 MG tablet Take 1 mg by mouth 2 (two) times daily.    Yes [provider]  losartan (COZAAR) 100 MG tablet Take 100 mg by mouth daily.   Yes [provider]  magnesium oxide (MAG-OX) 400 (240 Mg) MG tablet Take 1 tablet by mouth daily. 05/16/21  Yes  [provider]  metFORMIN (GLUCOPHAGE-XR) 500 MG 24 hr tablet Take 1 tablet by mouth 2 (two) times daily. 03/09/21  Yes [provider]  metFORMIN (GLUMETZA) 500 MG (MOD) 24 hr tablet Take 500 mg by mouth daily with breakfast.   Yes [provider]  rivaroxaban (XARELTO) 20 MG TABS tablet Take 20 mg by mouth daily with supper.   Yes [provider]  allopurinol (ZYLOPRIM) 100 MG tablet Take by mouth. 09/14/21   [provider]  baclofen (LIORESAL) 10 MG tablet Take 0.5  tablets (5 mg total) by mouth 3 (three) times daily. 04/23/22   Rodriguez-[place], Sunday Spillers, PA-C  colchicine 0.6 MG tablet Take by mouth. 01/23/19   [provider]  rosuvastatin (CRESTOR) 5 MG tablet Take 5 mg by mouth daily. Daily on Monday and Thursday    [provider]  traMADol (ULTRAM) 50 MG tablet Take 1 tablet (50 mg total) by mouth every 6 (six) hours as needed. 04/23/22   Rodriguez-[place], Sunday Spillers, PA-C    Family History Family History  Problem Relation Age of Onset   CVA Mother    Diabetes Mother    CVA Father     Social History Social History   Tobacco Use   Smoking status: Former    Packs/day: 0.25    Years: 20.00    Total pack years: 5.00    Types: Cigarettes    Quit date: 2002    Years since quitting: 21.9   Smokeless tobacco: Never  Vaping Use   Vaping Use: Never used  Substance Use Topics   Alcohol use: No   Drug use: No     Allergies   Keflex [cephalexin], Hydrochlorothiazide w-triamterene, Lisinopril, Penicillins, and Statins   Review of Systems Review of Systems: negative unless otherwise stated in HPI.      Physical Exam Triage Vital Signs ED Triage Vitals  Enc Vitals Group     BP 11/18/22 0840 (!) 174/117     Pulse Rate 11/18/22 0840 (!) 109     Resp 11/18/22 0840 16     Temp 11/18/22 0840 98.2 F (36.8 C)     Temp Source 11/18/22 0840 Oral     SpO2 11/18/22 0840 98 %     Weight --      Height --      Head Circumference --      Peak Flow --      Pain Score 11/18/22 0841 0     Pain Loc --      Pain Edu? --      Excl. in [place]? --    No data found.  Updated Vital Signs BP (!) 158/113 (BP Location: Right Arm)   Pulse 88   Temp 97.9 F (36.6 C) (Oral)   Resp 16   SpO2 95%   Visual Acuity Right Eye Distance:   Left Eye Distance:   Bilateral Distance:    Right Eye Near:   Left Eye Near:    Bilateral Near:     Physical Exam GEN:     alert, non-toxic appearing female in no distress, smiling    HENT:   mucus membranes moist, oropharyngeal without lesions or exudate, no tonsillar hypertrophy, no oropharyngeal erythema, no nasal discharge EYES:   pupils equal and reactive, no scleral injection or discharge NECK:  ROM at baseline, supple RESP:  no increased work of breathing, clear to auscultation bilaterally CVS:  Tachycardic, regular rhythm, no JVP NEURO: No facial droop, cranial nerves II through  XII grossly intact, gross sensation intact, strength bilateral upper and lower extremities 5/5, alert and oriented x4 Skin:   warm and dry    UC Treatments / Results  Labs (all labs ordered are listed, but only abnormal results are displayed) Labs Reviewed  CBC WITH DIFFERENTIAL/PLATELET - Abnormal; Notable for the following components:      Result Value   WBC 11.0 (*)    RBC 5.56 (*)    Hemoglobin 16.2 (*)    HCT 49.3 (*)    Neutro Abs 9.0 (*)    All other components within normal limits  BASIC METABOLIC PANEL - Abnormal; Notable for the following components:   Glucose, Bld 178 (*)    GFR, Estimated 56 (*)    All other components within normal limits  TROPONIN I (HIGH SENSITIVITY)    EKG   Radiology No results found.  Procedures Procedures (including critical care time)  Medications Ordered in UC Medications  sodium chloride 0.9 % bolus 500 mL (0 mLs Intravenous Stopped 11/18/22 1051)    Initial Impression / Assessment and Plan / UC Course  I have reviewed the triage vital signs and the nursing notes.  Pertinent labs & imaging results that were available during my care of the patient were reviewed by me and considered in my medical decision making (see chart for details).       Pt is a 81 y.o. female who presents for acute headache and fatigue since waking up this morning.  She is hypertensive and tachycardic. [place] is afebrile here without recent antipyretics. Satting well on room air. Overall pt is non-toxic appearing, well hydrated, without respiratory distress.  Neuro  and cardiopulmonary exams are unremarkable.  She requests IV fluids as this has helped her in the past.  Given that she is tachycardic and hypertensive we will give her a 500 mL bolus.  Obtained a high-sensitivity troponin, CBC and CMP.  She is not A-fib with RVR. EKG showing sinus tachycardia without acute ST or T wave changes.  Troponin was negative.  She does have some hyperglycemia, glucose 178.  There were no other significant electrolyte abnormalities.  She has evidence of hemoconcentration on her CBC.  I suspect that her headache may be related to mild dehydration.  Her neuro exam is normal therefore I doubt acute CVA.  There has been no recent falls to suggest a brain bleed.  She is feeling much better after 500 cc bolus of normal saline.  Patient is stable for discharge home.    Return and ED precautions given and patient/guardian voiced understanding. Discussed MDM, treatment plan and plan for follow-up with patient who agrees with plan.     Final Clinical Impressions(s) / UC Diagnoses   Final diagnoses:  Mild dehydration  Palpitations     Discharge Instructions      I am glad you are feeling better. It is important that you take your medications when you get home. Be sure to drink plenty of water.       ED Prescriptions   None    PDMP not reviewed this encounter.   Lyndee Hensen, DO 11/18/22 1347

## 2022-11-18 NOTE — Discharge Instructions (Addendum)
I am glad you are feeling better. It is important that you take your medications when you get home. Be sure to drink plenty of water.

## 2022-11-18 NOTE — ED Triage Notes (Signed)
Patient states she feels weak, tired, legs feel like jello, and headache. She states feels like heart racing no chest pain or breathing problem.

## 2022-11-19 ENCOUNTER — Inpatient Hospital Stay
Admission: EM | Admit: 2022-11-19 | Discharge: 2022-11-22 | DRG: 321 | Disposition: A | Discharge status: Home / Self Care | Payer: Medicare PPO | Attending: Hospitalist | Admitting: Hospitalist

## 2022-11-19 ENCOUNTER — Other Ambulatory Visit: Payer: Self-pay

## 2022-11-19 ENCOUNTER — Encounter
Admission: EM | Disposition: A | Discharge status: Home / Self Care | Payer: Self-pay | Source: Home / Self Care | Attending: Hospitalist

## 2022-11-19 ENCOUNTER — Telehealth (HOSPITAL_COMMUNITY): Payer: Self-pay | Admitting: Pharmacy Technician

## 2022-11-19 ENCOUNTER — Encounter: Payer: Self-pay | Admitting: Internal Medicine

## 2022-11-19 ENCOUNTER — Other Ambulatory Visit (HOSPITAL_COMMUNITY): Payer: Self-pay

## 2022-11-19 DIAGNOSIS — I428 Other cardiomyopathies: Secondary | ICD-10-CM | POA: Diagnosis not present

## 2022-11-19 DIAGNOSIS — K219 Gastro-esophageal reflux disease without esophagitis: Secondary | ICD-10-CM | POA: Diagnosis present

## 2022-11-19 DIAGNOSIS — E860 Dehydration: Secondary | ICD-10-CM | POA: Diagnosis present

## 2022-11-19 DIAGNOSIS — Z23 Encounter for immunization: Secondary | ICD-10-CM | POA: Diagnosis present

## 2022-11-19 DIAGNOSIS — Z880 Allergy status to penicillin: Secondary | ICD-10-CM

## 2022-11-19 DIAGNOSIS — I214 Non-ST elevation (NSTEMI) myocardial infarction: Secondary | ICD-10-CM

## 2022-11-19 DIAGNOSIS — I110 Hypertensive heart disease with heart failure: Secondary | ICD-10-CM | POA: Diagnosis present

## 2022-11-19 DIAGNOSIS — I255 Ischemic cardiomyopathy: Secondary | ICD-10-CM | POA: Diagnosis present

## 2022-11-19 DIAGNOSIS — Z87891 Personal history of nicotine dependence: Secondary | ICD-10-CM | POA: Diagnosis not present

## 2022-11-19 DIAGNOSIS — Z833 Family history of diabetes mellitus: Secondary | ICD-10-CM | POA: Diagnosis not present

## 2022-11-19 DIAGNOSIS — I480 Paroxysmal atrial fibrillation: Secondary | ICD-10-CM | POA: Diagnosis present

## 2022-11-19 DIAGNOSIS — I493 Ventricular premature depolarization: Secondary | ICD-10-CM | POA: Diagnosis present

## 2022-11-19 DIAGNOSIS — I4891 Unspecified atrial fibrillation: Secondary | ICD-10-CM

## 2022-11-19 DIAGNOSIS — E1142 Type 2 diabetes mellitus with diabetic polyneuropathy: Secondary | ICD-10-CM | POA: Diagnosis present

## 2022-11-19 DIAGNOSIS — I2510 Atherosclerotic heart disease of native coronary artery without angina pectoris: Secondary | ICD-10-CM | POA: Diagnosis present

## 2022-11-19 DIAGNOSIS — I10 Essential (primary) hypertension: Secondary | ICD-10-CM | POA: Diagnosis not present

## 2022-11-19 DIAGNOSIS — Z7901 Long term (current) use of anticoagulants: Secondary | ICD-10-CM | POA: Diagnosis not present

## 2022-11-19 DIAGNOSIS — Z888 Allergy status to other drugs, medicaments and biological substances status: Secondary | ICD-10-CM

## 2022-11-19 DIAGNOSIS — F32A Depression, unspecified: Secondary | ICD-10-CM | POA: Diagnosis present

## 2022-11-19 DIAGNOSIS — I2111 ST elevation (STEMI) myocardial infarction involving right coronary artery: Secondary | ICD-10-CM | POA: Diagnosis present

## 2022-11-19 DIAGNOSIS — G4733 Obstructive sleep apnea (adult) (pediatric): Secondary | ICD-10-CM | POA: Diagnosis present

## 2022-11-19 DIAGNOSIS — Z881 Allergy status to other antibiotic agents status: Secondary | ICD-10-CM | POA: Diagnosis not present

## 2022-11-19 DIAGNOSIS — Z7984 Long term (current) use of oral hypoglycemic drugs: Secondary | ICD-10-CM

## 2022-11-19 DIAGNOSIS — E782 Mixed hyperlipidemia: Secondary | ICD-10-CM | POA: Diagnosis present

## 2022-11-19 DIAGNOSIS — Z79899 Other long term (current) drug therapy: Secondary | ICD-10-CM

## 2022-11-19 DIAGNOSIS — I5021 Acute systolic (congestive) heart failure: Secondary | ICD-10-CM | POA: Diagnosis present

## 2022-11-19 DIAGNOSIS — I2119 ST elevation (STEMI) myocardial infarction involving other coronary artery of inferior wall: Secondary | ICD-10-CM

## 2022-11-19 DIAGNOSIS — Z9861 Coronary angioplasty status: Secondary | ICD-10-CM

## 2022-11-19 DIAGNOSIS — I213 ST elevation (STEMI) myocardial infarction of unspecified site: Secondary | ICD-10-CM

## 2022-11-19 HISTORY — PX: CORONARY/GRAFT ACUTE MI REVASCULARIZATION: CATH118305

## 2022-11-19 HISTORY — DX: ST elevation (STEMI) myocardial infarction involving other coronary artery of inferior wall: I21.19

## 2022-11-19 HISTORY — PX: LEFT HEART CATH AND CORONARY ANGIOGRAPHY: CATH118249

## 2022-11-19 LAB — CBC WITH DIFFERENTIAL/PLATELET
Abs Immature Granulocytes: 0.02 K/uL (ref 0.00–0.07)
Basophils Absolute: 0 K/uL (ref 0.0–0.1)
Basophils Relative: 0 %
Eosinophils Absolute: 0.2 K/uL (ref 0.0–0.5)
Eosinophils Relative: 2 %
HCT: 45.3 % (ref 36.0–46.0)
Hemoglobin: 14.6 g/dL (ref 12.0–15.0)
Immature Granulocytes: 0 %
Lymphocytes Relative: 21 %
Lymphs Abs: 1.9 K/uL (ref 0.7–4.0)
MCH: 29.3 pg (ref 26.0–34.0)
MCHC: 32.2 g/dL (ref 30.0–36.0)
MCV: 91 fL (ref 80.0–100.0)
Monocytes Absolute: 0.7 K/uL (ref 0.1–1.0)
Monocytes Relative: 8 %
Neutro Abs: 6.2 K/uL (ref 1.7–7.7)
Neutrophils Relative %: 69 %
Platelets: 270 K/uL (ref 150–400)
RBC: 4.98 MIL/uL (ref 3.87–5.11)
RDW: 12.9 % (ref 11.5–15.5)
WBC: 9.1 K/uL (ref 4.0–10.5)
nRBC: 0 % (ref 0.0–0.2)

## 2022-11-19 LAB — MRSA NEXT GEN BY PCR, NASAL: MRSA by PCR Next Gen: NOT DETECTED

## 2022-11-19 LAB — LIPID PANEL
Cholesterol: 269 mg/dL — ABNORMAL HIGH (ref 0–200)
HDL: 51 mg/dL (ref >40)
LDL Cholesterol: UNDETERMINED mg/dL (ref 0–99)
Total CHOL/HDL Ratio: 5.3 RATIO
Triglycerides: 477 mg/dL — ABNORMAL HIGH (ref <150)
VLDL: UNDETERMINED mg/dL (ref 0–40)

## 2022-11-19 LAB — COMPREHENSIVE METABOLIC PANEL WITH GFR
ALT: 14 U/L (ref 0–44)
AST: 24 U/L (ref 15–41)
Albumin: 3.9 g/dL (ref 3.5–5.0)
Alkaline Phosphatase: 78 U/L (ref 38–126)
Anion gap: 8 (ref 5–15)
BUN: 19 mg/dL (ref 8–23)
CO2: 28 mmol/L (ref 22–32)
Calcium: 9.3 mg/dL (ref 8.9–10.3)
Chloride: 103 mmol/L (ref 98–111)
Creatinine, Ser: 0.88 mg/dL (ref 0.44–1.00)
GFR, Estimated: >60 mL/min
Glucose, Bld: 196 mg/dL — ABNORMAL HIGH (ref 70–99)
Potassium: 3.9 mmol/L (ref 3.5–5.1)
Sodium: 139 mmol/L (ref 135–145)
Total Bilirubin: 0.6 mg/dL (ref 0.3–1.2)
Total Protein: 7.4 g/dL (ref 6.5–8.1)

## 2022-11-19 LAB — PROTIME-INR
INR: 1 (ref 0.8–1.2)
Prothrombin Time: 13.2 s (ref 11.4–15.2)

## 2022-11-19 LAB — GLUCOSE, CAPILLARY: Glucose-Capillary: 128 mg/dL — ABNORMAL HIGH (ref 70–99)

## 2022-11-19 LAB — TROPONIN I (HIGH SENSITIVITY)
Troponin I (High Sensitivity): 48 ng/L — ABNORMAL HIGH (ref <18)
Troponin I (High Sensitivity): 1075 ng/L

## 2022-11-19 LAB — POCT ACTIVATED CLOTTING TIME: Activated Clotting Time: 271 seconds

## 2022-11-19 LAB — APTT: aPTT: 28 seconds (ref 24–36)

## 2022-11-19 SURGERY — CORONARY/GRAFT ACUTE MI REVASCULARIZATION
Anesthesia: Moderate Sedation

## 2022-11-19 MED ORDER — ACETAMINOPHEN 325 MG PO TABS
650.0000 mg | ORAL_TABLET | ORAL | Status: DC | PRN
  Administered 2022-11-19: 650 mg via ORAL
  Filled 2022-11-19: qty 2

## 2022-11-19 MED ORDER — HEPARIN (PORCINE) IN NACL 1000-0.9 UT/500ML-% IV SOLN
INTRAVENOUS | Status: DC | PRN
  Administered 2022-11-19 (×2): 500 mL

## 2022-11-19 MED ORDER — FENTANYL CITRATE (PF) 100 MCG/2ML IJ SOLN
INTRAMUSCULAR | Status: AC
  Filled 2022-11-19: qty 2

## 2022-11-19 MED ORDER — TRAMADOL HCL 50 MG PO TABS
50.0000 mg | ORAL_TABLET | Freq: Four times a day (QID) | ORAL | Status: DC | PRN
  Filled 2022-11-19: qty 1

## 2022-11-19 MED ORDER — POLYETHYLENE GLYCOL 3350 17 G PO PACK: 17.0000 g | PACK | Freq: Every day | ORAL | Status: DC | PRN

## 2022-11-19 MED ORDER — INSULIN ASPART 100 UNIT/ML IJ SOLN
0.0000 [IU] | Freq: Three times a day (TID) | INTRAMUSCULAR | Status: DC
  Administered 2022-11-20 (×2): 2 [IU] via SUBCUTANEOUS
  Administered 2022-11-20: 3 [IU] via SUBCUTANEOUS
  Administered 2022-11-21: 2 [IU] via SUBCUTANEOUS
  Administered 2022-11-21: 3 [IU] via SUBCUTANEOUS
  Administered 2022-11-22 (×2): 2 [IU] via SUBCUTANEOUS
  Filled 2022-11-19 (×7): qty 1

## 2022-11-19 MED ORDER — ASPIRIN 81 MG PO CHEW: 324.0000 mg | CHEWABLE_TABLET | Freq: Once | ORAL | Status: DC

## 2022-11-19 MED ORDER — TICAGRELOR 90 MG PO TABS: 90.0000 mg | ORAL_TABLET | Freq: Two times a day (BID) | ORAL | Status: DC

## 2022-11-19 MED ORDER — ALPRAZOLAM 0.25 MG PO TABS
0.2500 mg | ORAL_TABLET | Freq: Once | ORAL | Status: AC
  Administered 2022-11-19: 0.25 mg via ORAL
  Filled 2022-11-19: qty 1

## 2022-11-19 MED ORDER — ATORVASTATIN CALCIUM 20 MG PO TABS
80.0000 mg | ORAL_TABLET | Freq: Every day | ORAL | Status: DC
  Administered 2022-11-20: 80 mg via ORAL
  Filled 2022-11-19: qty 4

## 2022-11-19 MED ORDER — HEPARIN SODIUM (PORCINE) 5000 UNIT/ML IJ SOLN: 60.0000 [IU]/kg | Freq: Once | INTRAMUSCULAR | Status: DC

## 2022-11-19 MED ORDER — CARVEDILOL 6.25 MG PO TABS
6.2500 mg | ORAL_TABLET | Freq: Two times a day (BID) | ORAL | Status: DC
  Administered 2022-11-20 – 2022-11-21 (×3): 6.25 mg via ORAL
  Filled 2022-11-19 (×3): qty 1

## 2022-11-19 MED ORDER — ATROPINE SULFATE 1 MG/10ML IJ SOSY
PREFILLED_SYRINGE | INTRAMUSCULAR | Status: AC
  Filled 2022-11-19: qty 10

## 2022-11-19 MED ORDER — ESCITALOPRAM OXALATE 10 MG PO TABS
10.0000 mg | ORAL_TABLET | Freq: Every day | ORAL | Status: DC
  Administered 2022-11-19 – 2022-11-21 (×3): 10 mg via ORAL
  Filled 2022-11-19 (×3): qty 1

## 2022-11-19 MED ORDER — NITROGLYCERIN 1 MG/10 ML FOR IR/CATH LAB
INTRA_ARTERIAL | Status: AC
  Filled 2022-11-19: qty 10

## 2022-11-19 MED ORDER — HEPARIN SODIUM (PORCINE) 1000 UNIT/ML IJ SOLN
INTRAMUSCULAR | Status: DC | PRN
  Administered 2022-11-19: 5000 [IU] via INTRAVENOUS
  Administered 2022-11-19 (×2): 2000 [IU] via INTRAVENOUS

## 2022-11-19 MED ORDER — SODIUM CHLORIDE 0.9% FLUSH
3.0000 mL | Freq: Two times a day (BID) | INTRAVENOUS | Status: DC
  Administered 2022-11-19 – 2022-11-22 (×6): 3 mL via INTRAVENOUS

## 2022-11-19 MED ORDER — HEPARIN (PORCINE) IN NACL 1000-0.9 UT/500ML-% IV SOLN
INTRAVENOUS | Status: AC
  Filled 2022-11-19: qty 1000

## 2022-11-19 MED ORDER — TICAGRELOR 90 MG PO TABS
ORAL_TABLET | ORAL | Status: AC
  Filled 2022-11-19: qty 1

## 2022-11-19 MED ORDER — SODIUM CHLORIDE 0.9 % IV SOLN: INTRAVENOUS | Status: DC

## 2022-11-19 MED ORDER — SODIUM CHLORIDE 0.9 % IV SOLN: 250.0000 mL | INTRAVENOUS | Status: DC | PRN

## 2022-11-19 MED ORDER — VERAPAMIL HCL 2.5 MG/ML IV SOLN
INTRAVENOUS | Status: DC | PRN
  Administered 2022-11-19: 2.5 mg via INTRA_ARTERIAL

## 2022-11-19 MED ORDER — MAGNESIUM OXIDE -MG SUPPLEMENT 400 (240 MG) MG PO TABS
400.0000 mg | ORAL_TABLET | Freq: Every day | ORAL | Status: DC
  Administered 2022-11-20 – 2022-11-22 (×3): 400 mg via ORAL
  Filled 2022-11-19 (×3): qty 1

## 2022-11-19 MED ORDER — SODIUM CHLORIDE 0.9% FLUSH
3.0000 mL | Freq: Two times a day (BID) | INTRAVENOUS | Status: DC
  Administered 2022-11-19 – 2022-11-22 (×4): 3 mL via INTRAVENOUS

## 2022-11-19 MED ORDER — VERAPAMIL HCL 2.5 MG/ML IV SOLN
INTRAVENOUS | Status: AC
  Filled 2022-11-19: qty 2

## 2022-11-19 MED ORDER — SODIUM CHLORIDE 0.9% FLUSH: 3.0000 mL | INTRAVENOUS | Status: DC | PRN

## 2022-11-19 MED ORDER — INFLUENZA VAC A&B SA ADJ QUAD 0.5 ML IM PRSY
0.5000 mL | PREFILLED_SYRINGE | INTRAMUSCULAR | Status: AC
  Administered 2022-11-22: 0.5 mL via INTRAMUSCULAR
  Filled 2022-11-19: qty 0.5

## 2022-11-19 MED ORDER — ASPIRIN 81 MG PO CHEW
81.0000 mg | CHEWABLE_TABLET | Freq: Every day | ORAL | Status: DC
  Administered 2022-11-20: 81 mg via ORAL
  Filled 2022-11-19: qty 1

## 2022-11-19 MED ORDER — PANTOPRAZOLE SODIUM 40 MG PO TBEC
40.0000 mg | DELAYED_RELEASE_TABLET | Freq: Two times a day (BID) | ORAL | Status: DC
  Administered 2022-11-19 – 2022-11-22 (×6): 40 mg via ORAL
  Filled 2022-11-19 (×6): qty 1

## 2022-11-19 MED ORDER — MIDAZOLAM HCL 2 MG/2ML IJ SOLN
INTRAMUSCULAR | Status: DC | PRN
  Administered 2022-11-19: 1 mg via INTRAVENOUS

## 2022-11-19 MED ORDER — ONDANSETRON HCL 4 MG/2ML IJ SOLN: 4.0000 mg | Freq: Four times a day (QID) | INTRAMUSCULAR | Status: DC | PRN

## 2022-11-19 MED ORDER — TICAGRELOR 90 MG PO TABS
90.0000 mg | ORAL_TABLET | Freq: Two times a day (BID) | ORAL | Status: AC
  Administered 2022-11-19 – 2022-11-20 (×3): 90 mg via ORAL
  Filled 2022-11-19 (×3): qty 1

## 2022-11-19 MED ORDER — NITROGLYCERIN 1 MG/10 ML FOR IR/CATH LAB
INTRA_ARTERIAL | Status: DC | PRN
  Administered 2022-11-19: 200 ug via INTRACORONARY

## 2022-11-19 MED ORDER — LIDOCAINE HCL 1 % IJ SOLN
INTRAMUSCULAR | Status: AC
  Filled 2022-11-19: qty 20

## 2022-11-19 MED ORDER — IOHEXOL 300 MG/ML  SOLN
INTRAMUSCULAR | Status: DC | PRN
  Administered 2022-11-19: 130 mL

## 2022-11-19 MED ORDER — MIDAZOLAM HCL 2 MG/2ML IJ SOLN
INTRAMUSCULAR | Status: AC
  Filled 2022-11-19: qty 2

## 2022-11-19 MED ORDER — LIDOCAINE HCL (PF) 1 % IJ SOLN
INTRAMUSCULAR | Status: DC | PRN
  Administered 2022-11-19: 2 mL

## 2022-11-19 MED ORDER — HEPARIN SODIUM (PORCINE) 1000 UNIT/ML IJ SOLN
INTRAMUSCULAR | Status: AC
  Filled 2022-11-19: qty 10

## 2022-11-19 MED ORDER — TICAGRELOR 90 MG PO TABS
ORAL_TABLET | ORAL | Status: DC | PRN
  Administered 2022-11-19: 180 mg via ORAL

## 2022-11-19 SURGICAL SUPPLY — 21 items
BALLN MINITREK RX 2.0X15 (BALLOONS) ×1
BALLN TREK RX 2.5X20 (BALLOONS) ×1
BALLOON MINITREK RX 2.0X15 (BALLOONS) IMPLANT
BALLOON TREK RX 2.5X20 (BALLOONS) IMPLANT
CATH 5F 110X4 TIG (CATHETERS) IMPLANT
CATH VISTA GUIDE 6FR JR4 (CATHETERS) IMPLANT
DEVICE RAD TR BAND REGULAR (VASCULAR PRODUCTS) IMPLANT
DRAPE BRACHIAL (DRAPES) IMPLANT
GLIDESHEATH SLEND SS 6F .021 (SHEATH) IMPLANT
GUIDEWIRE ANGLED .035 180CM (WIRE) IMPLANT
GUIDEWIRE INQWIRE 1.5J.035X260 (WIRE) IMPLANT
INQWIRE 1.5J .035X260CM (WIRE) ×1
KIT ENCORE 26 ADVANTAGE (KITS) IMPLANT
PACK CARDIAC CATH (CUSTOM PROCEDURE TRAY) ×1 IMPLANT
PROTECTION STATION PRESSURIZED (MISCELLANEOUS) ×1
SET ATX SIMPLICITY (MISCELLANEOUS) IMPLANT
STATION PROTECTION PRESSURIZED (MISCELLANEOUS) IMPLANT
STENT ONYX FRONTIER 3.5X30 (Permanent Stent) IMPLANT
TUBING CIL FLEX 10 FLL-RA (TUBING) IMPLANT
WIRE HITORQ VERSACORE ST 145CM (WIRE) IMPLANT
WIRE RUNTHROUGH .014X180CM (WIRE) IMPLANT

## 2022-11-19 NOTE — Assessment & Plan Note (Signed)
Patient presenting with 1 day diffuse chest pain with EKG demonstrating ST elevation in the inferior leads.  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  - Cardiology following; appreciate their recommendations -Cardiology started her on Eliquis for A-fib and discontinue aspirin - Continue Brilinta - Discontinue home rosuvastatin and start atorvastatin 80 mg daily - If triglycerides remain above goal, could consider Vascepa in the future - Echocardiogram pending

## 2022-11-19 NOTE — Assessment & Plan Note (Signed)
-   Hold home metformin - A1c pending - SSI, moderate

## 2022-11-19 NOTE — ED Provider Notes (Signed)
[facility] Provider Note    Event Date/Time   First MD Initiated Contact with Patient 11/19/22 1447     (approximate)   History   Code STEMI   HPI  Vanessa Young is a 81 y.o. female no history of cardiac disease who comes in with concern for chest pain.  Patient was seen yesterday for chest pain EKG reassuring troponin negative.  Patient reports worsening chest pain with a burning across her chest.  No numbness or tingling or weakness in 1 arm or leg.  Patient still able to ambulate.  Denies any falls and hitting her head.  Denies any smoking history.   Physical Exam   Triage Vital Signs: ED Triage Vitals  Enc Vitals Group     BP --      Pulse Rate 11/19/22 1442 (!) 108     Resp 11/19/22 1442 (!) 22     Temp 11/19/22 1442 98.7 F (37.1 C)     Temp Source 11/19/22 1442 Oral     SpO2 11/19/22 1442 92 %     Weight 11/19/22 1446 136 lb (61.7 kg)     Height 11/19/22 1446 '4\' 9"'$  (1.448 m)     Head Circumference --      Peak Flow --      Pain Score --      Pain Loc --      Pain Edu? --      Excl. in [place]? --     Most recent vital signs: Vitals:   11/19/22 1442  Pulse: (!) 108  Resp: (!) 22  Temp: 98.7 F (37.1 C)  SpO2: 92%     General: Awake, no distress.  CV:  Good peripheral perfusion.  Resp:  Normal effort.  Breath sounds noted bilaterally Abd:  No distention.  Soft and nontender.  Good radial pulses. Other:     ED Results / Procedures / Treatments   Labs (all labs ordered are listed, but only abnormal results are displayed) Labs Reviewed  CBC WITH DIFFERENTIAL/PLATELET  PROTIME-INR  APTT  COMPREHENSIVE METABOLIC PANEL  LIPID PANEL  TROPONIN I (HIGH SENSITIVITY)     EKG  My interpretation of EKG:  Sinus rate of 98 with ST elevation in the inferior leads with T wave inversions in aVL V2, normal intervals  RADIOLOGY Deferred for Cath Lab   PROCEDURES:  Critical Care performed: Yes, see critical care procedure  note(s)  .1-3 Lead EKG Interpretation  Performed by: Vanessa [place], MD Authorized by: Vanessa [place], MD     Interpretation: abnormal     ECG rate:  100   ECG rate assessment: tachycardic     Rhythm: sinus tachycardia     Ectopy: none     Conduction: normal   .Critical Care  Performed by: Vanessa [place], MD Authorized by: Vanessa [place], MD   Critical care provider statement:    Critical care time (minutes):  20   Critical care was necessary to treat or prevent imminent or life-threatening deterioration of the following conditions:  Cardiac failure   Critical care was time spent personally by me on the following activities:  Development of treatment plan with patient or surrogate, discussions with consultants, evaluation of patient's response to treatment, examination of patient, ordering and review of laboratory studies, ordering and review of radiographic studies, ordering and performing treatments and interventions, pulse oximetry, re-evaluation of patient's condition and review of old charts    [place] ED:  Medications  0.9 %  sodium chloride infusion (has no administration in time range)     IMPRESSION / MDM / ASSESSMENT AND PLAN / ED COURSE  I reviewed the triage vital signs and the nursing notes.   Patient's presentation is most consistent with acute presentation with potential threat to life or bodily function.   Patient comes in with chest pain.  EKG was reviewed by myself from the field and activated due to concern for inferior elevation.  Patient slightly hypoxic placed on 2 L.  Patient has breath sounds bilaterally.  She is on Eliquis doubt PE.  No evidence of dissection.  She denies any falls or any pain radiating to her back or numbness or tingling good radial pulses.  Dr. Fletcher Anon is at bedside they are going to take patient to the Cath Lab at this time.  Recommended holding off on heparin until the Cath Lab given patient had had Eliquis already  The  patient is on the cardiac monitor to evaluate for evidence of arrhythmia and/or significant heart rate changes.      FINAL CLINICAL IMPRESSION(S) / ED DIAGNOSES   Final diagnoses:  ST elevation myocardial infarction (STEMI), unspecified artery ([place])     Rx / DC Orders   ED Discharge Orders     None        Note:  This document was prepared using Dragon voice recognition software and may include unintentional dictation errors.   Vanessa [place], MD 11/19/22 1452

## 2022-11-19 NOTE — Progress Notes (Signed)
       CROSS COVER NOTE  NAME: DEBI COUSIN MRN: [id number] DOB : 09/25/1941 ATTENDING PHYSICIAN: Jose Persia, MD    Date of Service   11/19/2022   HPI/Events of Note   Request for home med PDMP reviewed  Interventions   Assessment/Plan: Xanax 0.'25mg'$  X X      This document was prepared using Dragon voice recognition software and may include unintentional dictation errors.  Neomia Glass DNP, MBA, FNP-BC Nurse Practitioner [facility] Pager [phone]

## 2022-11-19 NOTE — ED Notes (Signed)
Dr. Arida at bedside 

## 2022-11-19 NOTE — Consult Note (Signed)
Cardiology Consultation   Patient ID: Vanessa Young MRN: [id number]; DOB: 05/22/1941  Admit date: 11/19/2022 Date of Consult: 11/19/2022  PCP:  Vanessa Soman, MD   Vanessa Young Providers Cardiologist:  Dr. Ubaldo Glassing. The patient requested transfer to our service due to retirement of Dr. Ubaldo Glassing.   Patient Profile:   Vanessa Young is a 81 y.o. female with a hx of paroxysmal atrial fibrillation on Xarelto, type 2 diabetes, essential hypertension, hyperlipidemia who is being seen 11/19/2022 for the evaluation of inferior STEMI at the request of Dr. Jari Pigg.  History of Present Illness:   Vanessa Young is an 81 year old female with no prior ischemic heart disease.  She went to the ED yesterday with generalized weakness and increased headache.  She was thought to be slightly volume depleted and was given 500 mL of normal saline and felt better.  She was discharged home.  After discharge, she started having substernal chest pain described as burning sensation across the whole chest radiating to both arms.  She slept and felt better but woke up with substernal tightness feeling and heartburn.  She called a friend who works in EMS who recommended calling 911.  An EKG was done at the field and showed evidence of inferior ST elevation and thus a code STEMI was activated.  The patient was still having chest pain at the time of my evaluation.  Given her symptoms and EKG changes, I recommended proceeding with emergent cardiac catheterization.  She reported that she does not take Xarelto daily and instead she takes it 3 times weekly..  She felt that was sufficient and her last dose was on Friday.   Past Medical History:  Diagnosis Date   A-fib ([place])    Depression    Diabetes mellitus without complication (HCC)    GERD (gastroesophageal reflux disease)    Hypercholesteremia    Hypertension     Past Surgical History:  Procedure Laterality Date   CYSTOSCOPY       Home Medications:  Prior to Admission  medications   Medication Sig Start Date End Date Taking? Authorizing Provider  ACCU-CHEK GUIDE test strip  01/03/22   [provider]  allopurinol (ZYLOPRIM) 100 MG tablet Take by mouth. 09/14/21   [provider]  ALPRAZolam Duanne Moron) 0.25 MG tablet Take by mouth. 02/28/22   [provider]  baclofen (LIORESAL) 10 MG tablet Take 0.5 tablets (5 mg total) by mouth 3 (three) times daily. 04/23/22   Rodriguez-Southworth, Sunday Spillers, PA-C  colchicine 0.6 MG tablet Take by mouth. 01/23/19   [provider]  diltiazem (CARDIZEM CD) 180 MG 24 hr capsule Take 180 mg by mouth at bedtime. 05/17/21   [provider]  diltiazem (TIAZAC) 180 MG 24 hr capsule Take by mouth. 09/14/21   [provider]  escitalopram (LEXAPRO) 5 MG tablet Take 5 mg by mouth at bedtime.     [provider]  guanFACINE (TENEX) 1 MG tablet Take 1 mg by mouth 2 (two) times daily.     [provider]  losartan (COZAAR) 100 MG tablet Take 100 mg by mouth daily.    [provider]  magnesium oxide (MAG-OX) 400 (240 Mg) MG tablet Take 1 tablet by mouth daily. 05/16/21   [provider]  metFORMIN (GLUCOPHAGE-XR) 500 MG 24 hr tablet Take 1 tablet by mouth 2 (two) times daily. 03/09/21   [provider]  metFORMIN (GLUMETZA) 500 MG (MOD) 24 hr tablet Take 500 mg by mouth daily  with breakfast.    [provider]  rivaroxaban (XARELTO) 20 MG TABS tablet Take 20 mg by mouth daily with supper.    [provider]  rosuvastatin (CRESTOR) 5 MG tablet Take 5 mg by mouth daily. Daily on Monday and Thursday    [provider]  traMADol (ULTRAM) 50 MG tablet Take 1 tablet (50 mg total) by mouth every 6 (six) hours as needed. 04/23/22   Rodriguez-Southworth, Sunday Spillers, PA-C    Inpatient Medications: Scheduled Meds:  [START ON 11/20/2022] aspirin  81 mg Oral Daily   atorvastatin  80 mg Oral Daily   carvedilol  6.25 mg Oral BID WC   escitalopram   10 mg Oral QHS   [START ON 11/20/2022] influenza vaccine adjuvanted  0.5 mL Intramuscular Tomorrow-1000   insulin aspart  0-15 Units Subcutaneous TID WC   magnesium oxide  400 mg Oral Daily   pantoprazole  40 mg Oral BID AC   sodium chloride flush  3 mL Intravenous Q12H   sodium chloride flush  3 mL Intravenous Q12H   [START ON 11/20/2022] ticagrelor  90 mg Oral BID   Continuous Infusions:  sodium chloride     sodium chloride     PRN Meds: sodium chloride, acetaminophen, ondansetron (ZOFRAN) IV, polyethylene glycol, sodium chloride flush, traMADol  Allergies:    Allergies  Allergen Reactions   Keflex [Cephalexin] Hives   Hydrochlorothiazide W-Triamterene Nausea Only   Lisinopril Other (See Comments)    Unknown     Penicillins Hives   Statins Other (See Comments)    Unknown     Social History:   Social History   Socioeconomic History   Marital status: Widowed    Spouse name: Not on file   Number of children: Not on file   Years of education: Not on file   Highest education level: Not on file  Occupational History   Not on file  Tobacco Use   Smoking status: Former    Packs/day: 0.25    Years: 20.00    Total pack years: 5.00    Types: Cigarettes    Quit date: 2002    Years since quitting: 21.9   Smokeless tobacco: Never  Vaping Use   Vaping Use: Never used  Substance and Sexual Activity   Alcohol use: No   Drug use: No   Sexual activity: Not on file  Other Topics Concern   Not on file  Social History Narrative   Not on file   Social Determinants of Health   Financial Resource Strain: Not on file  Food Insecurity: No Food Insecurity (11/19/2022)   Hunger Vital Sign    Worried About Running Out of Food in the Last Year: Never true    Ran Out of Food in the Last Year: Never true  Transportation Needs: No Transportation Needs (11/19/2022)   PRAPARE - Hydrologist (Medical): No    Lack of Transportation (Non-Medical): No   Physical Activity: Not on file  Stress: Not on file  Social Connections: Not on file  Intimate Partner Violence: Not At Risk (11/19/2022)   Humiliation, Afraid, Rape, and Kick questionnaire    Fear of Current or Ex-Partner: No    Emotionally Abused: No    Physically Abused: No    Sexually Abused: No    Family History:    Family History  Problem Relation Age of Onset   CVA Mother    Diabetes Mother    CVA Father  ROS:  Please see the history of present illness.   All other ROS reviewed and negative.     Physical Exam/Data:   Vitals:   11/19/22 1449 11/19/22 1633 11/19/22 1700 11/19/22 1800  BP:  129/87 132/69 (!) 142/71  Pulse:  85 83 84  Resp:  20 18 (!) 21  Temp:  98 F (36.7 C)    TempSrc:  Oral    SpO2:  95% 95% 95%  Weight: 71.8 kg     Height:        Intake/Output Summary (Last 24 hours) at 11/19/2022 2008 Last data filed at 11/19/2022 1700 Gross per 24 hour  Intake 480 ml  Output 600 ml  Net -120 ml      11/19/2022    2:49 PM 11/19/2022    2:46 PM 04/30/2022    8:09 AM  Last 3 Weights  Weight (lbs) 158 lb 3.2 oz 136 lb 134 lb 0.6 oz  Weight (kg) 71.759 kg 61.689 kg 60.8 kg     Body mass index is 34.23 kg/m.  General:  Well nourished, well developed, in no mild distress due to pain. HEENT: normal Neck: no JVD Vascular: No carotid bruits; Distal pulses 2+ bilaterally Cardiac:  normal S1, S2; RRR; no murmur  Lungs:  clear to auscultation bilaterally, no wheezing, rhonchi or rales  Abd: soft, nontender, no hepatomegaly  Ext: no edema Musculoskeletal:  No deformities, BUE and BLE strength normal and equal Skin: warm and dry  Neuro:  CNs 2-12 intact, no focal abnormalities noted Psych:  Normal affect   EKG:  The EKG was personally reviewed and demonstrates: Normal sinus rhythm with 1 to 2 mm of ST elevation in the inferior leads that was not evident on yesterday's EKG. Telemetry:  Telemetry was personally reviewed and demonstrates:    Relevant  CV Studies:   Laboratory Data:  High Sensitivity Troponin:   Recent Labs  Lab 11/18/22 1006 11/19/22 1446 11/19/22 1724  TROPONINIHS 10 48* 1,075*     Chemistry Recent Labs  Lab 11/18/22 1006 11/19/22 1446  NA 139 139  K 4.8 3.9  CL 100 103  CO2 27 28  GLUCOSE 178* 196*  BUN 22 19  CREATININE 1.00 0.88  CALCIUM 10.2 9.3  GFRNONAA 56* >60  ANIONGAP 12 8    Recent Labs  Lab 11/19/22 1446  PROT 7.4  ALBUMIN 3.9  AST 24  ALT 14  ALKPHOS 78  BILITOT 0.6   Lipids  Recent Labs  Lab 11/19/22 1446  CHOL 269*  TRIG 477*  HDL 51  LDLCALC UNABLE TO CALCULATE IF TRIGLYCERIDE OVER 400 mg/dL  CHOLHDL 5.3    Hematology Recent Labs  Lab 11/18/22 1006 11/19/22 1446  WBC 11.0* 9.1  RBC 5.56* 4.98  HGB 16.2* 14.6  HCT 49.3* 45.3  MCV 88.7 91.0  MCH 29.1 29.3  MCHC 32.9 32.2  RDW 13.3 12.9  PLT 273 270   Thyroid No results for input(s): "TSH", "FREET4" in the last 168 hours.  BNPNo results for input(s): "BNP", "PROBNP" in the last 168 hours.  DDimer No results for input(s): "DDIMER" in the last 168 hours.   Radiology/Studies:  CARDIAC CATHETERIZATION  Addendum Date: 11/19/2022     1st Mrg lesion is 100% stenosed.   Prox LAD to Mid LAD lesion is 40% stenosed.   1st Diag lesion is 60% stenosed.   Mid LAD to Dist LAD lesion is 99% stenosed.   RPDA lesion is 85% stenosed.   Prox RCA  to Mid RCA lesion is 40% stenosed.   Mid RCA to Dist RCA lesion is 99% stenosed.   A drug-eluting stent was successfully placed using a STENT ONYX FRONTIER 3.5X30.   Post intervention, there is a 0% residual stenosis.   There is moderate left ventricular systolic dysfunction.   LV end diastolic pressure is severely elevated.   The left ventricular ejection fraction is 35-45% by visual estimate. 1.  Severe three-vessel coronary artery disease.  The culprit is a subtotal occlusion of the mid right coronary artery.  In addition, the patient has occluded OM1 with collaterals and diffuse  subocclusive disease throughout the mid and distal LAD. 2.  Moderately reduced LV systolic function with severely elevated left ventricular end-diastolic pressure 35 mmHg. 3.  Successful angioplasty and drug-eluting stent placement to the mid right coronary artery. 4.  Door to device was delayed by difficult access via the right radial artery given calcified, tortuous and stenosed proximal radial artery.  This was navigated successfully with a run-through wire using balloon assisted tracking. Recommendations: Will treat with aspirin and ticagrelor for now. The patient is chronically anticoagulated for atrial fibrillation.  Anticoagulation can likely be resumed tomorrow if no bleeding issues but should consider switching Xarelto to Eliquis and stopping aspirin to minimize the risk of bleeding considering her age. I do not think the LAD is optimal for PCI given long diffuse disease.  Favor medical therapy.  Result Date: 11/19/2022   1st Mrg lesion is 100% stenosed.   Prox LAD to Mid LAD lesion is 40% stenosed.   1st Diag lesion is 60% stenosed.   Mid LAD to Dist LAD lesion is 99% stenosed.   RPDA lesion is 85% stenosed.   Prox RCA to Mid RCA lesion is 40% stenosed.   Mid RCA to Dist RCA lesion is 99% stenosed.   A drug-eluting stent was successfully placed using a STENT ONYX FRONTIER 3.5X30.   Post intervention, there is a 0% residual stenosis.   There is moderate left ventricular systolic dysfunction.   LV end diastolic pressure is severely elevated.   The left ventricular ejection fraction is 35-45% by visual estimate. 1.  Severe three-vessel coronary artery disease.  The culprit is a subtotal occlusion of the mid right coronary artery.  In addition, the patient has occluded OM1 with collaterals and diffuse subocclusive disease throughout the mid and distal LAD. 2.  Moderately reduced LV systolic function with severely elevated left ventricular end-diastolic pressure 35 mmHg. 3.  Successful angioplasty and  drug-eluting stent placement to the mid right coronary artery. 4.  Dooe to device was delayed by difficult access via the right radial artery given calcified, tortuous and stenosed proximal radial artery.  This was navigated successfully with a run-through wire using balloon assisted tracking. Recommendations: Will treat with aspirin and ticagrelor for now. The patient is chronically anticoagulated for atrial fibrillation.  Anticoagulation can likely be resumed tomorrow if no bleeding issues but should consider switching Xarelto to Eliquis and stopping aspirin to minimize the risk of bleeding considering her age. I do not think the LAD is optimal for PCI given long diffuse disease.  Favor medical therapy.     Assessment and Plan:   Inferior ST elevation myocardial infarction: Emergent cardiac catheterization was done via the right radial artery which showed significant three-vessel coronary artery disease.  The culprit was subtotal occlusion of the mid right coronary artery which was treated successfully with PCI and drug-eluting stent placement.  In addition, she was found  to have an occluded OM1 with collaterals and diffuse subocclusive disease throughout the mid and distal LAD.  EF was moderately reduced with an LVEDP of 35.  She is chest pain-free at the present time.  Continue treatment with aspirin and Brilinta for now.  Once anticoagulation is resumed, aspirin can be discontinued to minimize the risk of bleeding.  I requested an echocardiogram.  In spite of significant disease affecting the LAD, the patient denies anginal symptoms prior to her current presentation.  I favor medical therapy for her LAD unless she has refractory symptoms given that the vessel is diffusely diseased. Post MI cardiomyopathy: Monitor for any shortness of breath given that her LVEDP was significantly high at 35.  Will diurese as needed.  She is currently on not hypoxic.  I started small dose carvedilol. Paroxysmal atrial  fibrillation: She is in sinus rhythm: I favor switching Xarelto to Eliquis which has a less chance of bleeding.  This can likely be resumed tomorrow if no bleeding issues from the right arm.  Do not resume diltiazem given cardiomyopathy.   Risk Assessment/Risk Scores:     TIMI Risk Score for ST  Elevation MI:   The patient's TIMI risk score is 7, which indicates a 23.4% risk of all cause mortality at 30 days.{  For questions or updates, please contact [place] Please consult [email or web address] for contact info under    Signed, Kathlyn Sacramento, MD  11/19/2022 8:08 PM

## 2022-11-19 NOTE — Assessment & Plan Note (Signed)
-   Per cardiology, holding home diltiazem and losartan

## 2022-11-19 NOTE — Telephone Encounter (Signed)
Pharmacy Patient Advocate Encounter  Insurance verification completed.    The patient is insured through [facility]    The patient is currently admitted and ran test claims for the following: Brilinta '90mg'$ .  Copays and coinsurance results were relayed to Inpatient clinical team.

## 2022-11-19 NOTE — Progress Notes (Signed)
   11/19/22 1500  Clinical Encounter Type  Visited With Patient and family together  Visit Type Initial  Referral From Nurse  Consult/Referral To Chaplain   Chaplain responded to code stemi.Bonney Roussel supported family friend while daughter is on the way. Chaplain provided compassionate presence and reflective listening as family friend spoke about health challenges of patient. Chaplain services are available for follow up as needed.

## 2022-11-19 NOTE — Assessment & Plan Note (Addendum)
-   Cardiology started her on Eliquis today which she will take with Brilinta - Holding home diltiazem 180 mg nightly, per cardiology

## 2022-11-19 NOTE — ED Notes (Signed)
Pt to cath on stretcher/[facility] with Dr. Fletcher Anon.

## 2022-11-19 NOTE — Assessment & Plan Note (Signed)
Patient describes a several month history of increased belching and feeling of heartburn at times.  She has been taking Prilosec once a day with some improvement.  - Start Protonix 40 mg twice daily - Recommend outpatient follow-up with PCP

## 2022-11-19 NOTE — ED Notes (Signed)
MD at bedside - pt taken to cath lab with Otila Kluver, RN and cardiologiest [place]

## 2022-11-19 NOTE — H&P (Signed)
History and Physical    Patient: Vanessa Young MWN:[id number] DOB: 15-May-1941 DOA: 11/19/2022 DOS: the patient was seen and examined on 11/19/2022 PCP: Myrtie Soman, MD  Patient coming from: Home  Chief Complaint:  Chief Complaint  Patient presents with   Code STEMI   HPI: Vanessa Young is a 81 y.o. female with medical history significant of paroxysmal atrial fibrillation on Xarelto, type 2 diabetes, hypertension, hyperlipidemia, OSA, who presents to the ED with complaints of chest pain.  Vanessa Young states she went to the ED yesterday as she felt dehydrated.  She has been having a headache and increased belching.  She received 500 cc of normal saline and felt significantly better.  Due to this, she was discharged home.  Today, Vanessa Young states she was feeling better overall but then developed sudden onset burning sensation throughout her chest that radiated into her bilateral arms.  The pain did not radiate elsewhere.  Due to progressively worsening nature of the pain, she called EMS.  She denies any shortness of breath, palpitations, focal weakness, vision changes, headache, nausea, vomiting, diarrhea, abdominal pain.  Vanessa Young states that she assumed that the pain was secondary to severe heartburn as she has been having increased belching for several months.  She started Prilosec once daily that seems to have somewhat improved her symptoms.  She has never been seen by GI.  She denies any weight loss or bowel movement changes.  ED course: On arrival to the ED, patient was tacky at 108 hypertensive at 161/98.  EKG was obtained that demonstrated ST elevation in the inferior leads.  Code STEMI was called and patient was taken emergently to the Cath Lab.  Catheterization remarkable for 99% blockage of the RCA s/p DES.  TRH consulted for admission.  Review of Systems: As mentioned in the history of present illness. All other systems reviewed and are negative. Past Medical History:  Diagnosis Date    A-fib ([place])    Depression    Diabetes mellitus without complication (HCC)    GERD (gastroesophageal reflux disease)    Hypercholesteremia    Hypertension    Past Surgical History:  Procedure Laterality Date   CYSTOSCOPY     Social History:  reports that she quit smoking about 21 years ago. Her smoking use included cigarettes. She has a 5.00 pack-year smoking history. She has never used smokeless tobacco. She reports that she does not drink alcohol and does not use drugs.  Allergies  Allergen Reactions   Keflex [Cephalexin] Hives   Hydrochlorothiazide W-Triamterene Nausea Only   Lisinopril Other (See Comments)    Unknown     Penicillins Hives   Statins Other (See Comments)    Unknown     Family History  Problem Relation Age of Onset   CVA Mother    Diabetes Mother    CVA Father     Prior to Admission medications   Medication Sig Start Date End Date Taking? Authorizing Provider  ACCU-CHEK GUIDE test strip  01/03/22   [provider]  allopurinol (ZYLOPRIM) 100 MG tablet Take by mouth. 09/14/21   [provider]  ALPRAZolam Duanne Moron) 0.25 MG tablet Take by mouth. 02/28/22   [provider]  baclofen (LIORESAL) 10 MG tablet Take 0.5 tablets (5 mg total) by mouth 3 (three) times daily. 04/23/22   Rodriguez-Southworth, Sunday Spillers, PA-C  colchicine 0.6 MG tablet Take by mouth. 01/23/19   [provider]  diltiazem (CARDIZEM CD) 180 MG 24 hr capsule Take  180 mg by mouth at bedtime. 05/17/21   [provider]  diltiazem (TIAZAC) 180 MG 24 hr capsule Take by mouth. 09/14/21   [provider]  escitalopram (LEXAPRO) 5 MG tablet Take 5 mg by mouth at bedtime.     [provider]  guanFACINE (TENEX) 1 MG tablet Take 1 mg by mouth 2 (two) times daily.     [provider]  losartan (COZAAR) 100 MG tablet Take 100 mg by mouth daily.    [provider]  magnesium oxide (MAG-OX) 400 (240 Mg) MG tablet Take 1 tablet by mouth  daily. 05/16/21   [provider]  metFORMIN (GLUCOPHAGE-XR) 500 MG 24 hr tablet Take 1 tablet by mouth 2 (two) times daily. 03/09/21   [provider]  metFORMIN (GLUMETZA) 500 MG (MOD) 24 hr tablet Take 500 mg by mouth daily with breakfast.    [provider]  rivaroxaban (XARELTO) 20 MG TABS tablet Take 20 mg by mouth daily with supper.    [provider]  rosuvastatin (CRESTOR) 5 MG tablet Take 5 mg by mouth daily. Daily on Monday and Thursday    [provider]  traMADol (ULTRAM) 50 MG tablet Take 1 tablet (50 mg total) by mouth every 6 (six) hours as needed. 04/23/22   Rodriguez-Southworth, Sunday Spillers, PA-C    Physical Exam: Vitals:   11/19/22 1449 11/19/22 1633 11/19/22 1700 11/19/22 1800  BP:  129/87 132/69 (!) 142/71  Pulse:  85 83 84  Resp:  20 18 (!) 21  Temp:  98 F (36.7 C)    TempSrc:  Oral    SpO2:  95% 95% 95%  Weight: 71.8 kg     Height:       Physical Exam Vitals and nursing note reviewed.  Constitutional:      General: She is not in acute distress.    Appearance: She is obese. She is not toxic-appearing.  HENT:     Head: Normocephalic and atraumatic.     Mouth/Throat:     Mouth: Mucous membranes are dry.     Pharynx: Oropharynx is clear.  Eyes:     Conjunctiva/sclera: Conjunctivae normal.     Pupils: Pupils are equal, round, and reactive to light.  Cardiovascular:     Rate and Rhythm: Normal rate and regular rhythm.     Heart sounds: No murmur heard.    No gallop.  Pulmonary:     Effort: Pulmonary effort is normal. No respiratory distress.     Breath sounds: Normal breath sounds. No wheezing or rales.  Abdominal:     General: Bowel sounds are normal. There is no distension.     Palpations: Abdomen is soft.     Tenderness: There is no abdominal tenderness. There is no guarding.  Musculoskeletal:     Right lower leg: No edema.     Left lower leg: No edema.  Skin:    General: Skin is warm and dry.  Neurological:      General: No focal deficit present.     Mental Status: She is alert and oriented to person, place, and time. Mental status is at baseline.  Psychiatric:        Mood and Affect: Mood normal.        Behavior: Behavior normal.    Data Reviewed: CBC with WBC of 9.1, hemoglobin of 14.6, and platelets of 270.  CMP with potassium of 3.9, glucose of 196, creatinine of 0.88, BUN of 19, GFR over 60.  PT/INR and PTT within normal limits.  Initial troponin elevated at 48.  Previously at ten 1 day ago.  Lipid panel with total cholesterol of 269 and markedly elevated triglycerides at 477.  EKG personally reviewed.  ST elevation noted in leads II, III and aVF  Results are pending, will review when available.  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  - Cardiology following; appreciate their recommendations - Continue aspirin and Brilinta - Discontinue home rosuvastatin and start atorvastatin 80 mg daily - If triglycerides remain above goal, could consider Vascepa in the future - Echocardiogram pending  Atrial fibrillation ([place]) - Holding home Xarelto. Per cardiology, can restart on 12/5 - Holding home diltiazem 180 mg nightly, per cardiology  Benign essential hypertension - Per cardiology, holding home diltiazem and losartan  Type 2 diabetes mellitus with diabetic polyneuropathy, without long-term current use of insulin ([place]) - Hold home metformin - A1c pending - SSI, moderate  GERD (gastroesophageal reflux disease) Patient describes a several month history of increased belching and feeling of heartburn at times.  She has been taking Prilosec once a day with some improvement.  - Start Protonix 40 mg twice daily -  Recommend outpatient follow-up with PCP  Advance Care Planning:   Code Status: Full Code   Consults: Cardiology  Family Communication: Patient's granddaughter updated at bedside  Severity of Illness: The appropriate patient status for this patient is INPATIENT. Inpatient status is judged to be reasonable and necessary in order to provide the required intensity of service to ensure the patient's safety. The patient's presenting symptoms, physical exam findings, and initial radiographic and laboratory data in the context of their chronic comorbidities is felt to place them at high risk for further clinical deterioration. Furthermore, it is not anticipated that the patient will be medically stable for discharge from the hospital within 2 midnights of admission.   * I certify that at the point of admission it is my clinical judgment that the patient will require inpatient hospital care spanning beyond 2 midnights from the point of admission due to high intensity of service, high risk for further deterioration and high frequency of surveillance required.*  Author: Jose Persia, MD 11/19/2022 7:56 PM  For on call review [email or web address].

## 2022-11-19 NOTE — ED Triage Notes (Signed)
BIBA from home - c/o fiery chest pain that radiates down left arm started last night -seen at [facility] and d/c home.  STEMI called by EMS in the field. Pt arrives with no pain, repeat EKG confirms stemi  Received 4 zofran, 56mg fentanyl, 3 nitro, and 324 ASA by EMS

## 2022-11-19 NOTE — TOC Benefit Eligibility Note (Signed)
Patient Research scientist (life sciences) completed.     The patient is currently admitted and upon discharge could be taking Brilinta '90mg'$ .   The current 30 day co-pay is, $40.   The patient is insured through [employer].

## 2022-11-20 ENCOUNTER — Inpatient Hospital Stay (HOSPITAL_COMMUNITY)
Admit: 2022-11-20 | Discharge: 2022-11-20 | Disposition: A | Discharge status: Home / Self Care | Payer: Medicare PPO | Attending: Cardiovascular Disease | Admitting: Cardiovascular Disease

## 2022-11-20 ENCOUNTER — Encounter: Payer: Self-pay | Admitting: Cardiovascular Disease

## 2022-11-20 DIAGNOSIS — I5021 Acute systolic (congestive) heart failure: Secondary | ICD-10-CM

## 2022-11-20 DIAGNOSIS — I213 ST elevation (STEMI) myocardial infarction of unspecified site: Secondary | ICD-10-CM | POA: Diagnosis not present

## 2022-11-20 DIAGNOSIS — I480 Paroxysmal atrial fibrillation: Secondary | ICD-10-CM

## 2022-11-20 LAB — BASIC METABOLIC PANEL
Anion gap: 6 (ref 5–15)
BUN: 15 mg/dL (ref 8–23)
CO2: 30 mmol/L (ref 22–32)
Calcium: 8.8 mg/dL — ABNORMAL LOW (ref 8.9–10.3)
Chloride: 106 mmol/L (ref 98–111)
Creatinine, Ser: 0.96 mg/dL (ref 0.44–1.00)
GFR, Estimated: 59 mL/min — ABNORMAL LOW (ref >60)
Glucose, Bld: 131 mg/dL — ABNORMAL HIGH (ref 70–99)
Potassium: 4.1 mmol/L (ref 3.5–5.1)
Sodium: 142 mmol/L (ref 135–145)

## 2022-11-20 LAB — ECHOCARDIOGRAM COMPLETE
AR max vel: 2.04 cm2
AV Area VTI: 2.14 cm2
AV Area mean vel: 2.03 cm2
AV Mean grad: 3 mmHg
AV Peak grad: 5.5 mmHg
Ao pk vel: 1.17 m/s
Area-P 1/2: 5.31 cm2
Calc EF: 60.4 %
Height: 57 in
S' Lateral: 2.7 cm
Single Plane A2C EF: 60.2 %
Single Plane A4C EF: 59.6 %
Weight: 2232.82 oz

## 2022-11-20 LAB — HEPATIC FUNCTION PANEL
ALT: 15 U/L (ref 0–44)
AST: 35 U/L (ref 15–41)
Albumin: 3 g/dL — ABNORMAL LOW (ref 3.5–5.0)
Alkaline Phosphatase: 54 U/L (ref 38–126)
Bilirubin, Direct: <0.1 mg/dL (ref 0.0–0.2)
Total Bilirubin: 0.7 mg/dL (ref 0.3–1.2)
Total Protein: 5.9 g/dL — ABNORMAL LOW (ref 6.5–8.1)

## 2022-11-20 LAB — CBC
HCT: 37 % (ref 36.0–46.0)
Hemoglobin: 11.8 g/dL — ABNORMAL LOW (ref 12.0–15.0)
MCH: 28.7 pg (ref 26.0–34.0)
MCHC: 31.9 g/dL (ref 30.0–36.0)
MCV: 90 fL (ref 80.0–100.0)
Platelets: 215 10*3/uL (ref 150–400)
RBC: 4.11 MIL/uL (ref 3.87–5.11)
RDW: 12.9 % (ref 11.5–15.5)
WBC: 6 10*3/uL (ref 4.0–10.5)
nRBC: 0 % (ref 0.0–0.2)

## 2022-11-20 LAB — GLUCOSE, CAPILLARY
Glucose-Capillary: 157 mg/dL — ABNORMAL HIGH (ref 70–99)
Glucose-Capillary: 148 mg/dL — ABNORMAL HIGH (ref 70–99)
Glucose-Capillary: 121 mg/dL — ABNORMAL HIGH (ref 70–99)
Glucose-Capillary: 160 mg/dL — ABNORMAL HIGH (ref 70–99)

## 2022-11-20 LAB — LDL CHOLESTEROL, DIRECT: Direct LDL: 154 mg/dL — ABNORMAL HIGH (ref 0–99)

## 2022-11-20 LAB — TROPONIN I (HIGH SENSITIVITY): Troponin I (High Sensitivity): 3612 ng/L (ref <18)

## 2022-11-20 MED ORDER — CLOPIDOGREL BISULFATE 75 MG PO TABS
300.0000 mg | ORAL_TABLET | Freq: Once | ORAL | Status: AC
  Administered 2022-11-20: 300 mg via ORAL
  Filled 2022-11-20: qty 4

## 2022-11-20 MED ORDER — APIXABAN 5 MG PO TABS
5.0000 mg | ORAL_TABLET | Freq: Two times a day (BID) | ORAL | Status: DC
  Administered 2022-11-20 – 2022-11-22 (×4): 5 mg via ORAL
  Filled 2022-11-20 (×4): qty 1

## 2022-11-20 MED ORDER — ALPRAZOLAM 0.25 MG PO TABS
0.2500 mg | ORAL_TABLET | Freq: Once | ORAL | Status: AC
  Administered 2022-11-20: 0.25 mg via ORAL
  Filled 2022-11-20: qty 1

## 2022-11-20 MED ORDER — LOSARTAN POTASSIUM 50 MG PO TABS
25.0000 mg | ORAL_TABLET | Freq: Every day | ORAL | Status: DC
  Administered 2022-11-20: 25 mg via ORAL
  Filled 2022-11-20: qty 1

## 2022-11-20 MED ORDER — ROSUVASTATIN CALCIUM 10 MG PO TABS
40.0000 mg | ORAL_TABLET | Freq: Every day | ORAL | Status: DC
  Administered 2022-11-20 – 2022-11-22 (×3): 40 mg via ORAL
  Filled 2022-11-20 (×3): qty 4

## 2022-11-20 MED ORDER — CHLORHEXIDINE GLUCONATE CLOTH 2 % EX PADS
6.0000 | MEDICATED_PAD | Freq: Every day | CUTANEOUS | Status: DC
  Administered 2022-11-20 – 2022-11-21 (×2): 6 via TOPICAL

## 2022-11-20 MED ORDER — PERFLUTREN LIPID MICROSPHERE
1.0000 mL | INTRAVENOUS | Status: AC | PRN
  Administered 2022-11-20: 4 mL via INTRAVENOUS

## 2022-11-20 MED ORDER — CLOPIDOGREL BISULFATE 75 MG PO TABS
75.0000 mg | ORAL_TABLET | Freq: Every day | ORAL | Status: DC
  Administered 2022-11-22: 75 mg via ORAL
  Filled 2022-11-20: qty 1

## 2022-11-20 MED ORDER — FUROSEMIDE 10 MG/ML IJ SOLN
20.0000 mg | Freq: Once | INTRAMUSCULAR | Status: AC
  Administered 2022-11-20: 20 mg via INTRAVENOUS
  Filled 2022-11-20: qty 2

## 2022-11-20 NOTE — Hospital Course (Addendum)
Taken from H&P.   Vanessa Young is a 81 y.o. female with medical history significant of paroxysmal atrial fibrillation on Xarelto, type 2 diabetes, hypertension, hyperlipidemia, OSA, who presents to the ED with complaints of chest pain.  Described as burning sensation throughout her chest, radiating to both arms.  No associated shortness of breath, palpitation. Rest of the review of system was normal.  ED course.  On arrival she was tachycardic at 108, hypertensive at 161/98.  EKG with ST elevation in inferior leads, code STEMI was called and she was taken emergently to Cath Lab. Catheterization remarkable for 99% blockage of the RCA s/p DES.  Started on aspirin and Brilinta.  12/5: Hemodynamically stable.  Labs pertinent for still rising troponin at 3612, CBC with decrease of hemoglobin to 11.8 from 14.6 on admission.  All cell lines decreased so some dilutional effect.  Lipid profile with direct LDL of 154, total cholesterol 269 and triglyceride 477. Pending echocardiogram

## 2022-11-20 NOTE — TOC Initial Note (Signed)
Transition of Care [facility]) - Initial/Assessment Note    Patient Details  Name: Vanessa Young MRN: [id number] Date of Birth: September 10, 1941  Transition of Care [facility]) CM/SW Contact:    Shelbie Hutching, RN Phone Number: 11/20/2022, 2:50 PM  Clinical Narrative:                  Transition of Care [facility]) Screening Note   Patient Details  Name: Vanessa Young Date of Birth: [birth year]   Transition of Care [facility]) CM/SW Contact:    Shelbie Hutching, RN Phone Number: 11/20/2022, 2:50 PM    Transition of Care Department [facility]) has reviewed patient and no TOC needs have been identified at this time. We will continue to monitor patient advancement through interdisciplinary progression rounds. If new patient transition needs arise, please place a TOC consult.          Patient Goals and CMS Choice        Expected Discharge Plan and Services                                                Prior Living Arrangements/Services                       Activities of Daily Living Home Assistive Devices/Equipment: None ADL Screening (condition at time of admission) Patient's cognitive ability adequate to safely complete daily activities?: Yes Is the patient deaf or have difficulty hearing?: No Does the patient have difficulty seeing, even when wearing glasses/contacts?: No Does the patient have difficulty concentrating, remembering, or making decisions?: No Patient able to express need for assistance with ADLs?: Yes Does the patient have difficulty dressing or bathing?: No Independently performs ADLs?: Yes (appropriate for developmental age) Does the patient have difficulty walking or climbing stairs?: No Weakness of Legs: None Weakness of Arms/Hands: None  Permission Sought/Granted                  Emotional Assessment              Admission diagnosis:  ST elevation myocardial infarction (STEMI), unspecified artery ([place]) [I21.3] STEMI (ST elevation myocardial  infarction) ([place]) [I21.3] Acute non-ST elevation myocardial infarction (NSTEMI) of inferior wall ([place]) [I21.4] Patient Active Problem List   Diagnosis Date Noted   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 ([place]) 11/19/2022   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   Hydronephrosis 09/05/2020   Increased frequency of urination 09/05/2020   Microhematuria 09/05/2020   Aerophagia 05/25/2019   Thoracic aortic atherosclerosis ([place]) 12/23/2018   Paroxysmal atrial fibrillation with rapid ventricular response ([place]) 11/10/2018   SCC (squamous cell carcinoma), scalp/neck 03/23/2016   OSA on CPAP 06/11/2015   Lesion of bladder 11/08/2014   Atrial fibrillation ([place]) 01/20/2014   Osteoarthrosis, 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   Osteopenia 01/19/2013   Adenomatous polyp of colon 02/15/2012   OA (osteoarthritis) of knee 11/23/2011   Type 2 diabetes mellitus with microalbuminuria, without long-term current use of insulin ([place]) 09/28/2011   Type 2 diabetes mellitus with diabetic polyneuropathy, without long-term current use of insulin ([place]) 09/28/2011   GERD (gastroesophageal reflux disease) 09/28/2011   Major depressive disorder, recurrent, in partial remission ([place]) 09/28/2011   Benign essential hypertension 06/21/2011   Hypercholesterolemia 06/21/2011   Generalized anxiety disorder 06/21/2011   PCP:  Algie Coffer,  Opal Sidles, MD Pharmacy:   [place], [place] - [address] [place] [address] Phone: [phone] Fax: [id number] [address], [place] [address] Phone: [phone] Fax: [phone]  [id number] [place]Phone: [phone]: [address]: [phone] Fax: [phone]    Social Determinants of Health (SDOH) Interventions    Readmission Risk Interventions     No data to display

## 2022-11-20 NOTE — Progress Notes (Signed)
*  PRELIMINARY RESULTS* Echocardiogram 2D Echocardiogram has been performed.  Vanessa Young 11/20/2022, 10:29 AM

## 2022-11-20 NOTE — Progress Notes (Signed)
Progress Note   Patient: Vanessa Young BHA:[id number] DOB: 07/18/1941 DOA: 11/19/2022     1 DOS: the patient was seen and examined on 11/20/2022   Brief hospital course: Taken from H&P.   Vanessa Young is a 81 y.o. female with medical history significant of paroxysmal atrial fibrillation on Xarelto, type 2 diabetes, hypertension, hyperlipidemia, OSA, who presents to the ED with complaints of chest pain.  Described as burning sensation throughout her chest, radiating to both arms.  No associated shortness of breath, palpitation. Rest of the review of system was normal.  ED course.  On arrival she was tachycardic at 108, hypertensive at 161/98.  EKG with ST elevation in inferior leads, code STEMI was called and she was taken emergently to Cath Lab. Catheterization remarkable for 99% blockage of the RCA s/p DES.  Started on aspirin and Brilinta.  12/5: Hemodynamically stable.  Labs pertinent for still rising troponin at 3612, CBC with decrease of hemoglobin to 11.8 from 14.6 on admission.  All cell lines decreased so some dilutional effect.  Lipid profile with direct LDL of 154, total cholesterol 269 and triglyceride 477. 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  - Cardiology following; appreciate their recommendations -Cardiology started her on Eliquis for A-fib and discontinue aspirin - Continue Brilinta - Discontinue home rosuvastatin and start atorvastatin 80 mg daily - If triglycerides remain above goal, could consider Vascepa in the future - Echocardiogram pending  Atrial fibrillation [facility]) - Cardiology started her on Eliquis today which she will take with Brilinta - Holding home  diltiazem 180 mg nightly, per cardiology  Benign essential hypertension - Per cardiology, holding home diltiazem and losartan  Type 2 diabetes mellitus with diabetic polyneuropathy, without long-term current use of insulin ([place]) - Hold home metformin - A1c pending - SSI, moderate  GERD (gastroesophageal reflux disease) Patient describes a several month history of increased belching and feeling of heartburn at times.  She has been taking Prilosec once a day with some improvement.  - Start Protonix 40 mg twice daily - Recommend outpatient follow-up with PCP    Subjective: Patient was seen and examined today.  She was getting her echocardiogram.  Denies any chest pain or shortness of breath.  Physical Exam: Vitals:   11/20/22 0500 11/20/22 0600 11/20/22 0700 11/20/22 0716  BP: (!) 108/58 (!) 150/81 135/69   Pulse: 75 88 87 90  Resp: 20 (!) 21 18 (!) 21  Temp:      TempSrc:      SpO2: 94% 94% (!) 85% 93%  Weight: 63.3 kg     Height:       General.  Well-developed elderly lady, in no acute distress. Pulmonary.  Lungs clear bilaterally, normal respiratory effort. CV.  Regular rate and rhythm, no JVD, rub or murmur. Abdomen.  Soft, nontender, nondistended, BS positive. CNS.  Alert and oriented .  No focal neurologic deficit. Extremities.  No edema, no cyanosis, pulses intact and symmetrical. Psychiatry.  Judgment and insight appears normal.   Data Reviewed: Prior data reviewed  Family Communication: Discussed with daughter and granddaughter at bedside  Disposition: Status is: Inpatient Remains inpatient appropriate because: Severity of illness  Planned Discharge Destination:  Home with Home Health  DVT prophylaxis.  Eliquis  Time spent: 50 minutes  This record has been created using Systems analyst. Errors have been sought and corrected,but may not always be located. Such creation errors do not reflect on the standard of care.   Author: Lorella Nimrod,  MD 11/20/2022 1:47 PM  For on call review [email or web address].

## 2022-11-20 NOTE — Progress Notes (Signed)
       CROSS COVER NOTE  NAME: Vanessa Young MRN: [id number] DOB : September 29, 1941 ATTENDING PHYSICIAN: Lorella Nimrod, MD    Date of Service   11/20/2022   HPI/Events of Note   Medication request received for home Xanax as sleep aid   Interventions   Assessment/Plan:  Xanax 0.'25mg'$  Please address home meds with patient on rounds tomorrow      This document was prepared using Dragon voice recognition software and may include unintentional dictation errors.  Neomia Glass DNP, MBA, FNP-BC Nurse Practitioner [facility] Pager [phone]

## 2022-11-20 NOTE — Progress Notes (Signed)
Rounding Note    Patient Name: Vanessa Young Date of Encounter: 11/20/2022  [place] Cardiologist: None  New Consult done by Dr. Fletcher Anon  Subjective   Patient seen on a.m. rounds.  She denies chest pain, shortness of breath, or right radial discomfort.  Her daughter remains at the bedside.  No events recorded overnight.  Inpatient Medications    Scheduled Meds:  aspirin  81 mg Oral Daily   carvedilol  6.25 mg Oral BID WC   Chlorhexidine Gluconate Cloth  6 each Topical Daily   escitalopram  10 mg Oral QHS   influenza vaccine adjuvanted  0.5 mL Intramuscular Tomorrow-1000   insulin aspart  0-15 Units Subcutaneous TID WC   losartan  25 mg Oral Daily   magnesium oxide  400 mg Oral Daily   pantoprazole  40 mg Oral BID AC   rosuvastatin  40 mg Oral Daily   sodium chloride flush  3 mL Intravenous Q12H   sodium chloride flush  3 mL Intravenous Q12H   ticagrelor  90 mg Oral BID   Continuous Infusions:  sodium chloride     sodium chloride     PRN Meds: sodium chloride, acetaminophen, ondansetron (ZOFRAN) IV, polyethylene glycol, sodium chloride flush, traMADol   Vital Signs    Vitals:   11/20/22 0500 11/20/22 0600 11/20/22 0700 11/20/22 0716  BP: (!) 108/58 (!) 150/81 135/69   Pulse: 75 88 87 90  Resp: 20 (!) 21 18 (!) 21  Temp:      TempSrc:      SpO2: 94% 94% (!) 85% 93%  Weight: 63.3 kg     Height:        Intake/Output Summary (Last 24 hours) at 11/20/2022 1332 Last data filed at 11/20/2022 1248 Gross per 24 hour  Intake 720 ml  Output 1375 ml  Net -655 ml      11/20/2022    5:00 AM 11/19/2022    2:49 PM 11/19/2022    2:46 PM  Last 3 Weights  Weight (lbs) 139 lb 8.8 oz 158 lb 3.2 oz 136 lb  Weight (kg) 63.3 kg 71.759 kg 61.689 kg      Telemetry    Sinus rhythm with PVCs rates of 70-80- Personally Reviewed  ECG    No new tracings- Personally Reviewed  Physical Exam   GEN: No acute distress.   Neck: No JVD Cardiac: RRR, no murmurs, rubs,  or gallops.  Respiratory: Clear to auscultation bilaterally. GI: Soft, nontender, non-distended  MS: No edema; No deformity. Neuro:  Nonfocal  Psych: Normal affect   Labs    High Sensitivity Troponin:   Recent Labs  Lab 11/18/22 1006 11/19/22 1446 11/19/22 1724 11/20/22 0511  TROPONINIHS 10 48* 1,075* 3,612*     Chemistry Recent Labs  Lab 11/18/22 1006 11/19/22 1446 11/20/22 0511  NA 139 139 142  K 4.8 3.9 4.1  CL 100 103 106  CO2 '27 28 30  '$ GLUCOSE 178* 196* 131*  BUN '22 19 15  '$ CREATININE 1.00 0.88 0.96  CALCIUM 10.2 9.3 8.8*  PROT  --  7.4 5.9*  ALBUMIN  --  3.9 3.0*  AST  --  24 35  ALT  --  14 15  ALKPHOS  --  78 54  BILITOT  --  0.6 0.7  GFRNONAA 56* >60 59*  ANIONGAP '12 8 6    '$ Lipids  Recent Labs  Lab 11/19/22 1446  CHOL 269*  TRIG 477*  HDL 51  LDLCALC UNABLE TO CALCULATE IF TRIGLYCERIDE OVER 400 mg/dL  CHOLHDL 5.3    Hematology Recent Labs  Lab 11/18/22 1006 11/19/22 1446 11/20/22 0511  WBC 11.0* 9.1 6.0  RBC 5.56* 4.98 4.11  HGB 16.2* 14.6 11.8*  HCT 49.3* 45.3 37.0  MCV 88.7 91.0 90.0  MCH 29.1 29.3 28.7  MCHC 32.9 32.2 31.9  RDW 13.3 12.9 12.9  PLT 273 270 215   Thyroid No results for input(s): "TSH", "FREET4" in the last 168 hours.  BNPNo results for input(s): "BNP", "PROBNP" in the last 168 hours.  DDimer No results for input(s): "DDIMER" in the last 168 hours.   Radiology      Cardiac Studies  LHC 11/19/2022   1st Mrg lesion is 100% stenosed.   Prox LAD to Mid LAD lesion is 40% stenosed.   1st Diag lesion is 60% stenosed.   Mid LAD to Dist LAD lesion is 99% stenosed.   RPDA lesion is 85% stenosed.   Prox RCA to Mid RCA lesion is 40% stenosed.   Mid RCA to Dist RCA lesion is 99% stenosed.   A drug-eluting stent was successfully placed using a STENT ONYX FRONTIER 3.5X30.   Post intervention, there is a 0% residual stenosis.   There is moderate left ventricular systolic dysfunction.   LV end diastolic pressure is severely  elevated.   The left ventricular ejection fraction is 35-45% by visual estimate.   1.  Severe three-vessel coronary artery disease.  The culprit is a subtotal occlusion of the mid right coronary artery.  In addition, the patient has occluded OM1 with collaterals and diffuse subocclusive disease throughout the mid and distal LAD. 2.  Moderately reduced LV systolic function with severely elevated left ventricular end-diastolic pressure 35 mmHg. 3.  Successful angioplasty and drug-eluting stent placement to the mid right coronary artery. 4.  Door to device was delayed by difficult access via the right radial artery given calcified, tortuous and stenosed proximal radial artery.  This was navigated successfully with a run-through wire using balloon assisted tracking.   Recommendations: Will treat with aspirin and ticagrelor for now. The patient is chronically anticoagulated for atrial fibrillation.  Anticoagulation can likely be resumed tomorrow if no bleeding issues but should consider switching Xarelto to Eliquis and stopping aspirin to minimize the risk of bleeding considering her age. I do not think the LAD is optimal for PCI given long diffuse disease.  Favor medical therapy.  Patient Profile     81 y.o. female with a history of paroxysmal atrial fibrillation on chronic anticoagulation of Xarelto, type 2 diabetes, essential hypertension, hyperlipidemia and has been seen and evaluated for inferior STEMI.  Assessment & Plan    Inferior ST elevation myocardial infarction -High-sensitivity troponins trended and peaked at 3612 -She went and went emergent catheterization on 11/19/2022 which revealed significant three-vessel coronary artery disease.  Culprit lesion was a subtotal occlusion to the mid RCA which was successfully treated with PCI and DES -Remains chest pain-free on exam -She is continued on aspirin and Brilinta until her anticoagulation is resumed at that point time aspirin will be  discontinued to minimize the risk of bleeding -No bleeding noted at cath site -Continued on rosuvastatin, patient stated she was intolerant to atorvastatin, therapy was changed this morning -Echocardiogram ordered and pending -Continue cardiac monitoring -EKG as needed for changes and pain -Cardiac rehab referral -Okay to transfer to telemetry  Acute systolic heart failure secondary to ischemic cardiomyopathy -LVEF 35-45% by visual estimate and also  with severely elevated left ventricular end-diastolic pressure 35 mmHg -Diurese as needed -Currently denies shortness of breath -Has required oxygen off and on via nasal cannula -Echocardiogram ordered and pending -Continue on low-dose carvedilol -Starting on losartan 25 mg daily today -We will need repeat limited echocardiogram in 3 months to reevaluate heart function after MI -Escalate GDMT as tolerated -Daily weight, I's and O's, low-sodium diet -Heart failure education  Paroxysmal atrial fibrillation -Currently sinus rhythm with PVCs on the bedside monitor -We will discontinue aspirin and start apixaban 5 mg twice daily to minimize risk of bleeding for CHA2DS2-VASc score of at least 7 -Patient previously been on Xarelto but was taking her medication 3 days a week advised Eliquis dosing is twice daily to prevent the risk of stroke -Previously was on diltiazem that will not be resumed given her cardiomyopathy post MI -Continue cardiac monitoring  Hypertension -Blood pressure 135/69 -Continued on carvedilol and losartan -Vital signs per unit protocol  Hyperlipidemia -Direct LDL of 154 -Continued on rosuvastatin 40 mg daily -Repeat lipid and hepatic panel in 2 to 3 months   CHA2DS2-VASc Score = 7   This indicates a 11.2% annual risk of stroke. The patient's score is based upon: CHF History: 1 HTN History: 1 Diabetes History: 1 Stroke History: 0 Vascular Disease History: 1 Age Score: 2 Gender Score: 1         For  questions or updates, please contact [place] Please consult [email or web address] for contact info under        Signed, Maxcine Strong, NP  11/20/2022, 1:32 PM

## 2022-11-21 LAB — CBC
HCT: 42.7 % (ref 36.0–46.0)
Hemoglobin: 13.8 g/dL (ref 12.0–15.0)
MCH: 29.2 pg (ref 26.0–34.0)
MCHC: 32.3 g/dL (ref 30.0–36.0)
MCV: 90.3 fL (ref 80.0–100.0)
Platelets: 232 K/uL (ref 150–400)
RBC: 4.73 MIL/uL (ref 3.87–5.11)
RDW: 12.8 % (ref 11.5–15.5)
WBC: 8 K/uL (ref 4.0–10.5)
nRBC: 0 % (ref 0.0–0.2)

## 2022-11-21 LAB — BASIC METABOLIC PANEL
Anion gap: 7 (ref 5–15)
BUN: 17 mg/dL (ref 8–23)
CO2: 27 mmol/L (ref 22–32)
Calcium: 8.6 mg/dL — ABNORMAL LOW (ref 8.9–10.3)
Chloride: 107 mmol/L (ref 98–111)
Creatinine, Ser: 1.05 mg/dL — ABNORMAL HIGH (ref 0.44–1.00)
GFR, Estimated: 53 mL/min — ABNORMAL LOW (ref >60)
Glucose, Bld: 150 mg/dL — ABNORMAL HIGH (ref 70–99)
Potassium: 3.6 mmol/L (ref 3.5–5.1)
Sodium: 141 mmol/L (ref 135–145)

## 2022-11-21 LAB — GLUCOSE, CAPILLARY
Glucose-Capillary: 142 mg/dL — ABNORMAL HIGH (ref 70–99)
Glucose-Capillary: 153 mg/dL — ABNORMAL HIGH (ref 70–99)
Glucose-Capillary: 107 mg/dL — ABNORMAL HIGH (ref 70–99)

## 2022-11-21 LAB — LIPOPROTEIN A (LPA): Lipoprotein (a): 17.6 nmol/L (ref <75.0)

## 2022-11-21 LAB — HEMOGLOBIN A1C
Hgb A1c MFr Bld: 7.2 % — ABNORMAL HIGH (ref 4.8–5.6)
Mean Plasma Glucose: 160 mg/dL

## 2022-11-21 MED ORDER — ALPRAZOLAM 0.25 MG PO TABS
0.2500 mg | ORAL_TABLET | Freq: Every evening | ORAL | Status: DC | PRN
  Administered 2022-11-21: 0.25 mg via ORAL
  Filled 2022-11-21: qty 1

## 2022-11-21 MED ORDER — CARVEDILOL 12.5 MG PO TABS
12.5000 mg | ORAL_TABLET | Freq: Once | ORAL | Status: AC
  Administered 2022-11-21: 12.5 mg via ORAL
  Filled 2022-11-21: qty 1

## 2022-11-21 MED ORDER — CARVEDILOL 12.5 MG PO TABS
12.5000 mg | ORAL_TABLET | Freq: Two times a day (BID) | ORAL | Status: DC
  Administered 2022-11-21: 12.5 mg via ORAL
  Filled 2022-11-21: qty 1

## 2022-11-21 MED ORDER — LOSARTAN POTASSIUM 50 MG PO TABS
50.0000 mg | ORAL_TABLET | Freq: Once | ORAL | Status: AC
  Administered 2022-11-21: 50 mg via ORAL
  Filled 2022-11-21: qty 1

## 2022-11-21 MED ORDER — LOSARTAN POTASSIUM 50 MG PO TABS
100.0000 mg | ORAL_TABLET | Freq: Every day | ORAL | Status: DC
  Administered 2022-11-22: 100 mg via ORAL
  Filled 2022-11-21: qty 2

## 2022-11-21 MED ORDER — CARVEDILOL 6.25 MG PO TABS
6.2500 mg | ORAL_TABLET | Freq: Once | ORAL | Status: AC
  Administered 2022-11-21: 6.25 mg via ORAL
  Filled 2022-11-21: qty 1

## 2022-11-21 MED ORDER — CARVEDILOL 25 MG PO TABS
25.0000 mg | ORAL_TABLET | Freq: Two times a day (BID) | ORAL | Status: DC
  Administered 2022-11-22: 25 mg via ORAL
  Filled 2022-11-21: qty 1

## 2022-11-21 MED ORDER — LOSARTAN POTASSIUM 50 MG PO TABS
50.0000 mg | ORAL_TABLET | Freq: Every day | ORAL | Status: DC
  Administered 2022-11-21: 50 mg via ORAL
  Filled 2022-11-21: qty 1

## 2022-11-21 NOTE — Progress Notes (Signed)
Rounding Note    Patient Name: Vanessa Young Date of Encounter: 11/21/2022  [place] Cardiologist: None  New consult done by Dr. Fletcher Anon  Subjective   Patient seen on a.m. rounds.  She denies any chest pain.  She does endorse some slight shortness of breath this morning.  She also has elevated heart rate and elevated blood pressure during rounds which necessitated increased doses of her current medication regimen.  Inpatient Medications    Scheduled Meds:  apixaban  5 mg Oral BID   carvedilol  12.5 mg Oral BID WC   Chlorhexidine Gluconate Cloth  6 each Topical Daily   [START ON 11/22/2022] clopidogrel  75 mg Oral Daily   escitalopram  10 mg Oral QHS   influenza vaccine adjuvanted  0.5 mL Intramuscular Tomorrow-1000   insulin aspart  0-15 Units Subcutaneous TID WC   losartan  50 mg Oral Daily   magnesium oxide  400 mg Oral Daily   pantoprazole  40 mg Oral BID AC   rosuvastatin  40 mg Oral Daily   sodium chloride flush  3 mL Intravenous Q12H   sodium chloride flush  3 mL Intravenous Q12H   Continuous Infusions:  sodium chloride     PRN Meds: sodium chloride, acetaminophen, ondansetron (ZOFRAN) IV, polyethylene glycol, sodium chloride flush, traMADol   Vital Signs    Vitals:   11/21/22 0810 11/21/22 0909 11/21/22 1032 11/21/22 1033  BP:  (!) 136/94 (!) 178/95   Pulse: 94 (!) 114 90 83  Resp:  16 15   Temp:      TempSrc:      SpO2:  96% 96%   Weight:      Height:        Intake/Output Summary (Last 24 hours) at 11/21/2022 1119 Last data filed at 11/20/2022 2109 Gross per 24 hour  Intake 6 ml  Output 425 ml  Net -419 ml      11/21/2022    5:00 AM 11/20/2022    5:00 AM 11/19/2022    2:49 PM  Last 3 Weights  Weight (lbs) 138 lb 7.2 oz 139 lb 8.8 oz 158 lb 3.2 oz  Weight (kg) 62.8 kg 63.3 kg 71.759 kg      Telemetry    Sinus rhythm with PVCs- Personally Reviewed  ECG    No new tracings- Personally Reviewed  Physical Exam   GEN: No acute  distress.   Neck: No JVD Cardiac: RRR, no murmurs, rubs, or gallops. Right radial cath site 1+ radial pulse with bruising noted to area without hematoma or discomfort. Respiratory: Clear to auscultation bilaterally.  Respirations are unlabored at rest on room air GI: Soft, nontender, non-distended  MS: No edema; No deformity. Neuro:  Nonfocal  Psych: Normal affect   Labs    High Sensitivity Troponin:   Recent Labs  Lab 11/18/22 1006 11/19/22 1446 11/19/22 1724 11/20/22 0511  TROPONINIHS 10 48* 1,075* 3,612*     Chemistry Recent Labs  Lab 11/19/22 1446 11/20/22 0511 11/21/22 0459  NA 139 142 141  K 3.9 4.1 3.6  CL 103 106 107  CO2 '28 30 27  '$ GLUCOSE 196* 131* 150*  BUN '19 15 17  '$ CREATININE 0.88 0.96 1.05*  CALCIUM 9.3 8.8* 8.6*  PROT 7.4 5.9*  --   ALBUMIN 3.9 3.0*  --   AST 24 35  --   ALT 14 15  --   ALKPHOS 78 54  --   BILITOT 0.6 0.7  --  GFRNONAA >60 59* 53*  ANIONGAP '8 6 7    '$ Lipids  Recent Labs  Lab 11/19/22 1446  CHOL 269*  TRIG 477*  HDL 51  LDLCALC UNABLE TO CALCULATE IF TRIGLYCERIDE OVER 400 mg/dL  CHOLHDL 5.3    Hematology Recent Labs  Lab 11/19/22 1446 11/20/22 0511 11/21/22 0459  WBC 9.1 6.0 8.0  RBC 4.98 4.11 4.73  HGB 14.6 11.8* 13.8  HCT 45.3 37.0 42.7  MCV 91.0 90.0 90.3  MCH 29.3 28.7 29.2  MCHC 32.2 31.9 32.3  RDW 12.9 12.9 12.8  PLT 270 215 232   Thyroid No results for input(s): "TSH", "FREET4" in the last 168 hours.  BNPNo results for input(s): "BNP", "PROBNP" in the last 168 hours.  DDimer No results for input(s): "DDIMER" in the last 168 hours.   Radiology      Cardiac Studies  TTE 11/20/2022 1. Left ventricular ejection fraction, by estimation, is 60 to 65%. The  left ventricle has normal function. The left ventricle demonstrates  regional wall motion abnormalities (see scoring diagram/findings for  description). There is mild left ventricular   hypertrophy. Left ventricular diastolic parameters are consistent  with  Grade I diastolic dysfunction (impaired relaxation). Elevated left atrial  pressure. There is mild hypokinesis of the left ventricular, basal-mid  inferior wall and inferolateral wall.   2. Right ventricular systolic function is normal. The right ventricular  size is normal. Tricuspid regurgitation signal is inadequate for assessing  PA pressure.   3. The mitral valve is normal in structure. No evidence of mitral valve  regurgitation. No evidence of mitral stenosis.   4. The aortic valve is tricuspid. Aortic valve regurgitation is trivial.  No aortic stenosis is present.   LHC 11/19/2022   1st Mrg lesion is 100% stenosed.   Prox LAD to Mid LAD lesion is 40% stenosed.   1st Diag lesion is 60% stenosed.   Mid LAD to Dist LAD lesion is 99% stenosed.   RPDA lesion is 85% stenosed.   Prox RCA to Mid RCA lesion is 40% stenosed.   Mid RCA to Dist RCA lesion is 99% stenosed.   A drug-eluting stent was successfully placed using a STENT ONYX FRONTIER 3.5X30.   Post intervention, there is a 0% residual stenosis.   There is moderate left ventricular systolic dysfunction.   LV end diastolic pressure is severely elevated.   The left ventricular ejection fraction is 35-45% by visual estimate.   1.  Severe three-vessel coronary artery disease.  The culprit is a subtotal occlusion of the mid right coronary artery.  In addition, the patient has occluded OM1 with collaterals and diffuse subocclusive disease throughout the mid and distal LAD. 2.  Moderately reduced LV systolic function with severely elevated left ventricular end-diastolic pressure 35 mmHg. 3.  Successful angioplasty and drug-eluting stent placement to the mid right coronary artery. 4.  Door to device was delayed by difficult access via the right radial artery given calcified, tortuous and stenosed proximal radial artery.  This was navigated successfully with a run-through wire using balloon assisted tracking.   Recommendations: Will  treat with aspirin and ticagrelor for now. The patient is chronically anticoagulated for atrial fibrillation.  Anticoagulation can likely be resumed tomorrow if no bleeding issues but should consider switching Xarelto to Eliquis and stopping aspirin to minimize the risk of bleeding considering her age. I do not think the LAD is optimal for PCI given long diffuse disease.  Favor medical therapy.  Patient Profile  81 y.o. female with past medical history of paroxysmal atrial fibrillation on chronic anticoagulation is recently been changed to apixaban, type 2 diabetes, essential hypertension, hyperlipidemia has been seen and evaluated for inferior STEMI.  Assessment & Plan    Inferior ST elevation myocardial infarction -Continues to remain chest pain-free -High-sensitivity troponins peaked at 3612 -Underwent emergent catheterization on 11/19/2022 which revealed significant three-vessel coronary artery disease.  Culprit lesion was subtotal occlusion of the mid RCA which was successfully treated with PCI/DES -She is continued on clopidogrel and apixaban in lieu of aspirin to minimize risk of bleeding and rosuvastatin -Continued on rosuvastatin -Echocardiogram completed revealed LVEF 60-65%, G1 DD, and trivial AR -Continue with cardiac monitoring -Referral to cardiac rehab -Possible discharge today  Acute systolic heart for secondary to ischemic cardiomyopathy -LVEF 35-45% by visual estimate on left heart catheterization 60 to 65% on echocardiogram -Currently with some mild shortness of breath -Remains euvolemic on exam -Blood pressures and heart rate are elevated this morning -Carvedilol and losartan were increased -Continue with heart failure education -Daily weight, I's and O's, low-sodium diet  Paroxysmal atrial fibrillation -Currently sinus with PVCs on bedside monitor -Continue apixaban 5 mg twice daily for CHA2DS2-VASc score of at least 7 -Continue cardiac monitoring -Carvedilol  increased to 12.5 mg twice daily  Hypertension -Blood pressure 136/94 -Continued elevated blood pressures noted throughout the morning -Losartan increased to 100 mg daily and carvedilol increased to 12.5 mg twice daily -Vital signs per unit protocol  Hyperlipidemia -Direct LDL of 154 -Continued on rosuvastatin 40 mg daily -Repeat lipid and hepatic panel in 2 to 3 months     For questions or updates, please contact [place] Please consult [email or web address] for contact info under        Signed, Lokelani Lutes, NP  11/21/2022, 11:19 AM

## 2022-11-21 NOTE — Progress Notes (Signed)
Sent Secure chat to Dr. Billie Ruddy and made MD aware that patient's blood pressure remains elevated 178/97. MD order additional dose of coreg to be given now. Patient ambulated around entire ICU with walker and nurse at side. Patient tolerated well denying shortness of breath and denied chest pain.

## 2022-11-21 NOTE — Progress Notes (Signed)
Sent secure chat to Dr. Billie Ruddy and made MD aware that patient had a nose bleed this morning that was minimal and occasionally still having scant amount intermittently. Oxygen weaned off this morning which seems to have helped. MD acknowledged. No new orders.

## 2022-11-21 NOTE — Progress Notes (Signed)
  PROGRESS NOTE    Vanessa Young  VVO:[id number] DOB: [birth year] DOA: 11/19/2022 PCP: Myrtie Soman, MD  [id number]  LOS: 2 days   Brief hospital course:   Assessment & Plan: Vanessa Young is a 81 y.o. female with medical history significant of paroxysmal atrial fibrillation on Xarelto, type 2 diabetes, hypertension, hyperlipidemia, OSA, who presents to the ED with complaints of chest pain.  Described as burning sensation throughout her chest, radiating to both arms.   EKG with ST elevation in inferior leads, code STEMI was called and she was taken emergently to Cath Lab.    * 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 [facility]) Patient taken to the Cath Lab emergently and found to have a 99% occlusion of the RCA s/p DES. --transition from Brilinta to plavix, per cardio --cont coreg, increased to 25 mg BID --cont Crestor 40   Paroxysmal Afib - Holding home diltiazem 180 mg nightly, per cardiology --cont coreg, increased to 25 mg BID --cont Eliquis (new)   Benign essential hypertension - BP has been elevated --Per cardiology, holding home diltiazem  --cont losartan at increased 100 mg daily --cont coreg at increased 25 mg BID  Type 2 diabetes mellitus with diabetic polyneuropathy, without long-term current use of insulin ([place]) - Hold home metformin - A1c 7.2 --cont SSI   GERD (gastroesophageal reflux disease) Patient describes a several month history of increased belching and feeling of heartburn at times.  She has been taking Prilosec once a day with some improvement. --cont PPI   DVT prophylaxis: YI:[id number] Code Status: Full code  Family Communication:  Level of care: Progressive Dispo:   The patient is from: home Anticipated d/c is to: home Anticipated d/c date is: tomorrow   Subjective and Interval History:  Pt reported feeling better, no more chest discomfort.  HR and BP remained elevated.   Objective: Vitals:    11/21/22 1400 11/21/22 1600 11/21/22 1619 11/21/22 1800  BP: (!) 176/96 (!) 183/105 (!) 173/95 (!) 178/97  Pulse:  88    Resp: 17 (!) '24 17 19  '$ Temp:  98.3 F (36.8 C)    TempSrc:  Oral    SpO2:  95%    Weight:      Height:        Intake/Output Summary (Last 24 hours) at 11/21/2022 1829 Last data filed at 11/21/2022 1300 Gross per 24 hour  Intake 246 ml  Output --  Net 246 ml   Filed Weights   11/19/22 1449 11/20/22 0500 11/21/22 0500  Weight: 71.8 kg 63.3 kg 62.8 kg    Examination:   Constitutional: NAD, AAOx3 HEENT: conjunctivae and lids normal, EOMI CV: No cyanosis.   RESP: normal respiratory effort, on RA Extremities: No effusions, edema in BLE SKIN: warm, dry Neuro: II - XII grossly intact.   Psych: Normal mood and affect.  Appropriate judgement and reason   Data Reviewed: I have personally reviewed labs and imaging studies  Time spent: 50 minutes  Enzo Bi, MD [facility] If 7PM-7AM, please contact night-coverage 11/21/2022, 6:29 PM

## 2022-11-21 NOTE — Assessment & Plan Note (Signed)
Patient presenting with 1 day diffuse chest pain with EKG demonstrating ST elevation in the inferior leads.  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  - Cardiology following; appreciate their recommendations -Cardiology started her on Eliquis for A-fib and discontinue aspirin - Continue Brilinta - Discontinue home rosuvastatin and start atorvastatin 80 mg daily - If triglycerides remain above goal, could consider Vascepa in the future - Echocardiogram pending

## 2022-11-22 DIAGNOSIS — E782 Mixed hyperlipidemia: Secondary | ICD-10-CM

## 2022-11-22 LAB — GLUCOSE, CAPILLARY
Glucose-Capillary: 147 mg/dL — ABNORMAL HIGH (ref 70–99)
Glucose-Capillary: 128 mg/dL — ABNORMAL HIGH (ref 70–99)

## 2022-11-22 LAB — CBC
HCT: 43.7 % (ref 36.0–46.0)
Hemoglobin: 14.3 g/dL (ref 12.0–15.0)
MCH: 29.2 pg (ref 26.0–34.0)
MCHC: 32.7 g/dL (ref 30.0–36.0)
MCV: 89.4 fL (ref 80.0–100.0)
Platelets: 257 10*3/uL (ref 150–400)
RBC: 4.89 MIL/uL (ref 3.87–5.11)
RDW: 12.7 % (ref 11.5–15.5)
WBC: 7.8 10*3/uL (ref 4.0–10.5)
nRBC: 0 % (ref 0.0–0.2)

## 2022-11-22 LAB — BASIC METABOLIC PANEL
Anion gap: 9 (ref 5–15)
BUN: 22 mg/dL (ref 8–23)
CO2: 25 mmol/L (ref 22–32)
Calcium: 9 mg/dL (ref 8.9–10.3)
Chloride: 108 mmol/L (ref 98–111)
Creatinine, Ser: 0.98 mg/dL (ref 0.44–1.00)
GFR, Estimated: 58 mL/min — ABNORMAL LOW (ref >60)
Glucose, Bld: 151 mg/dL — ABNORMAL HIGH (ref 70–99)
Potassium: 3.7 mmol/L (ref 3.5–5.1)
Sodium: 142 mmol/L (ref 135–145)

## 2022-11-22 LAB — MAGNESIUM: Magnesium: 2 mg/dL (ref 1.7–2.4)

## 2022-11-22 MED ORDER — AMLODIPINE BESYLATE 5 MG PO TABS: 5.0000 mg | ORAL_TABLET | Freq: Every day | ORAL | Status: DC

## 2022-11-22 MED ORDER — AMLODIPINE BESYLATE 5 MG PO TABS: 5.0000 mg | ORAL_TABLET | Freq: Every day | ORAL | 2 refills | Status: DC

## 2022-11-22 MED ORDER — CLOPIDOGREL BISULFATE 75 MG PO TABS: 75.0000 mg | ORAL_TABLET | Freq: Every day | ORAL | 2 refills | Status: DC

## 2022-11-22 MED ORDER — ROSUVASTATIN CALCIUM 40 MG PO TABS: 40.0000 mg | ORAL_TABLET | Freq: Every day | ORAL | 2 refills | Status: DC

## 2022-11-22 MED ORDER — AMLODIPINE BESYLATE 5 MG PO TABS
5.0000 mg | ORAL_TABLET | Freq: Every day | ORAL | Status: DC
  Administered 2022-11-22: 5 mg via ORAL
  Filled 2022-11-22: qty 1

## 2022-11-22 MED ORDER — APIXABAN 5 MG PO TABS: 5.0000 mg | ORAL_TABLET | Freq: Two times a day (BID) | ORAL | 2 refills | Status: DC

## 2022-11-22 MED ORDER — CARVEDILOL 25 MG PO TABS: 25.0000 mg | ORAL_TABLET | Freq: Two times a day (BID) | ORAL | 2 refills | Status: DC

## 2022-11-22 NOTE — Discharge Summary (Signed)
Physician Discharge Summary   Vanessa Young  female DOB: June 17, 1941  HOZ:[id number]  PCP: Myrtie Soman, MD  Admit date: 11/19/2022 Discharge date: 11/22/2022  Admitted From: home Disposition:  home Daughter updated at bedside prior to discharge. CODE STATUS: Full code  Discharge Instructions     AMB Referral to Cardiac Rehabilitation - Phase II   Complete by: As directed    Diagnosis:  STEMI Coronary Stents     After initial evaluation and assessments completed: Virtual Based Care may be provided alone or in conjunction with Phase 2 Cardiac Rehab based on patient barriers.: Yes   Intensive Cardiac Rehabilitation (ICR) [place] location only OR Traditional Cardiac Rehabilitation (TCR) *If criteria for ICR are not met will enroll in TCR [facility] only): Yes   Diet - low sodium heart healthy   Complete by: As directed    Discharge instructions   Complete by: As directed    You have received a stent for your heart attack, and you also have atrial fibrillation.  Please take both Plavix and Eliquis at least for a year.  (Stop taking Xarelto)  In addition to your home Losartan, you are also started on amlodipine and carvedilol for your blood pressure control.  Please check your blood pressure 2-3 times per day and keep a record for your outpatient provider.  If systolic blood pressure is too low, 100's or below, please call your outpatient provider.  Your cholesterol medication Crestor has been increased to 40 mg daily.   Dr. Enzo Bi [facility] Course:  For full details, please see H&P, progress notes, consult notes and ancillary notes.  Briefly,  Vanessa Young is a 81 y.o. female with medical history significant of paroxysmal atrial fibrillation on Xarelto, type 2 diabetes, hypertension, hyperlipidemia, OSA, who presented to the ED with complaints of chest pain.  Described as burning sensation throughout her chest, radiating to both arms.   EKG with ST elevation in inferior leads,  code STEMI was called and she was taken emergently to Cath Lab.    * 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 [facility]) Patient taken to the Cath Lab emergently and found to have a 99% occlusion of the RCA s/p DES. --transitioned from Brilinta to plavix, per cardio.  Will take Plavix with Eliquis for 1 year. --started on Coreg 25 mg BID --Crestor increased to 40 mg daily   Paroxysmal Afib - home diltiazem d/c'ed by cardio. --started on Coreg 25 mg BID --started on Eliquis for anticoagulation.  Home Xarelto d/c'ed.   Benign essential hypertension - BP has been elevated - home diltiazem d/c'ed by cardio. --cont losartan at increased 100 mg daily --started on Coreg 25 mg BID and amlodipine 5 mg daily   Type 2 diabetes mellitus with diabetic polyneuropathy, without long-term current use of insulin (HCC) - A1c 7.2 --home metformin resumed after discharge   GERD (gastroesophageal reflux disease) Patient describes a several month history of increased belching and feeling of heartburn at times.  She has been taking Prilosec once a day with some improvement. --cont PPI   Discharge Diagnoses:  Principal Problem:   ST elevation myocardial infarction (STEMI) ([place]) Active Problems:   PAF (paroxysmal atrial fibrillation) ([place])   Primary hypertension   Type 2 diabetes mellitus with diabetic polyneuropathy, without long-term current use of insulin (HCC)   GERD (gastroesophageal reflux disease)   Mixed hyperlipidemia   30 Day Unplanned Readmission Risk  Score    Flowsheet Row ED to Hosp-Admission (Current) from 11/19/2022 in [facility] ICU/CCU  30 Day Unplanned Readmission Risk Score (%) 10.47 Filed at 11/22/2022 1200       This score is the patient's risk of an unplanned readmission within 30 days of being discharged (0 -100%). The score is based on dignosis, age, lab data, medications, orders, and past utilization.    Low:  0-14.9   Medium: 15-21.9   High: 22-29.9   Extreme: 30 and above         Discharge Instructions:  Allergies as of 11/22/2022       Reactions   Keflex [cephalexin] Hives   Hydrochlorothiazide W-triamterene Nausea Only   Lisinopril Other (See Comments)   Unknown    Penicillins Hives   Statins Other (See Comments)   Unknown         Medication List     STOP taking these medications    diltiazem 180 MG 24 hr capsule Commonly known as: TIAZAC   rivaroxaban 20 MG Tabs tablet Commonly known as: XARELTO       TAKE these medications    Accu-Chek Guide test strip Generic drug: glucose blood   allopurinol 100 MG tablet Commonly known as: ZYLOPRIM Take by mouth.   ALPRAZolam 0.25 MG tablet Commonly known as: XANAX Take by mouth.   amLODipine 5 MG tablet Commonly known as: NORVASC Take 1 tablet (5 mg total) by mouth daily. Start taking on: November 23, 2022   apixaban 5 MG Tabs tablet Commonly known as: ELIQUIS Take 1 tablet (5 mg total) by mouth 2 (two) times daily.   carvedilol 25 MG tablet Commonly known as: COREG Take 1 tablet (25 mg total) by mouth 2 (two) times daily with a meal.   clopidogrel 75 MG tablet Commonly known as: PLAVIX Take 1 tablet (75 mg total) by mouth daily. Start taking on: November 23, 2022   escitalopram 5 MG tablet Commonly known as: LEXAPRO Take 5 mg by mouth at bedtime.   guanFACINE 1 MG tablet Commonly known as: TENEX Take 1 mg by mouth 2 (two) times daily.   losartan 100 MG tablet Commonly known as: COZAAR Take 100 mg by mouth daily.   magnesium oxide 400 (240 Mg) MG tablet Commonly known as: MAG-OX Take 1 tablet by mouth daily.   metFORMIN 500 MG (MOD) 24 hr tablet Commonly known as: GLUMETZA Take 500 mg by mouth daily with breakfast.   rosuvastatin 40 MG tablet Commonly known as: CRESTOR Take 1 tablet (40 mg total) by mouth daily. Start taking on: November 23, 2022 What changed:  medication  strength how much to take additional instructions               Durable Medical Equipment  (From admission, onward)           Start     Ordered   11/22/22 1051  For home use only DME Walker rolling  Once       Question Answer Comment  Walker: With [place] Wheels   Patient needs a walker to treat with the following condition Weakness      11/22/22 1050             Follow-up Information     Myrtie Soman, MD Follow up in 1 week(s).   Specialty: Family Medicine Contact information: [address] [place] [address] [phone]         Minna Merritts,  MD Follow up in 2 week(s).   Specialty: Cardiology Contact information: [place] [address] [phone]                 Allergies  Allergen Reactions   Keflex [Cephalexin] Hives   Hydrochlorothiazide W-Triamterene Nausea Only   Lisinopril Other (See Comments)    Unknown     Penicillins Hives   Statins Other (See Comments)    Unknown      The results of significant diagnostics from this hospitalization (including imaging, microbiology, ancillary and laboratory) are listed below for reference.   Consultations:   Procedures/Studies: ECHOCARDIOGRAM COMPLETE  Result Date: 11/20/2022    ECHOCARDIOGRAM REPORT   Patient Name:   Vanessa Young Date of Exam: 11/20/2022 Medical Rec #:  [id number]   Height:       57.0 in Accession #:    [id number]  Weight:       139.6 lb Date of Birth:  02/25/1941    BSA:          1.544 m Patient Age:    67 years    BP:           137/86 mmHg Patient Gender: F           HR:           86 bpm. Exam Location:  [facility] Procedure: 2D Echo, Color Doppler, Cardiac Doppler and Intracardiac            Opacification Agent Indications:     I21.9 Acute myocardial infarction, unspecified  History:         Patient has prior history of Echocardiogram examinations, most                  recent 11/10/2018. Risk Factors:Hypertension,  Diabetes and HCL.  Sonographer:     Charmayne Sheer Referring Phys:  [id number] ARIDA Diagnosing Phys: Nelva Bush MD  Sonographer Comments: Suboptimal apical window and suboptimal subcostal window. IMPRESSIONS  1. Left ventricular ejection fraction, by estimation, is 60 to 65%. The left ventricle has normal function. The left ventricle demonstrates regional wall motion abnormalities (see scoring diagram/findings for description). There is mild left ventricular  hypertrophy. Left ventricular diastolic parameters are consistent with Grade I diastolic dysfunction (impaired relaxation). Elevated left atrial pressure. There is mild hypokinesis of the left ventricular, basal-mid inferior wall and inferolateral wall.  2. Right ventricular systolic function is normal. The right ventricular size is normal. Tricuspid regurgitation signal is inadequate for assessing PA pressure.  3. The mitral valve is normal in structure. No evidence of mitral valve regurgitation. No evidence of mitral stenosis.  4. The aortic valve is tricuspid. Aortic valve regurgitation is trivial. No aortic stenosis is present. FINDINGS  Left Ventricle: Left ventricular ejection fraction, by estimation, is 60 to 65%. The left ventricle has normal function. The left ventricle demonstrates regional wall motion abnormalities. Mild hypokinesis of the left ventricular, basal-mid inferior wall and inferolateral wall. Definity contrast agent was given IV to delineate the left ventricular endocardial borders. The left ventricular internal cavity size was normal in size. There is mild left ventricular hypertrophy. Left ventricular diastolic parameters are consistent with Grade I diastolic dysfunction (impaired relaxation). Elevated left atrial pressure. Right Ventricle: The right ventricular size is normal. No increase in right ventricular wall thickness. Right ventricular systolic function is normal. Tricuspid regurgitation signal is inadequate for assessing  PA pressure. Left Atrium: Left atrial size was normal in size. Right  Atrium: Right atrial size was not well visualized. Pericardium: There is no evidence of pericardial effusion. Mitral Valve: The mitral valve is normal in structure. No evidence of mitral valve regurgitation. No evidence of mitral valve stenosis. Tricuspid Valve: The tricuspid valve is normal in structure. Tricuspid valve regurgitation is trivial. Aortic Valve: The aortic valve is tricuspid. Aortic valve regurgitation is trivial. No aortic stenosis is present. Aortic valve mean gradient measures 3.0 mmHg. Aortic valve peak gradient measures 5.5 mmHg. Aortic valve area, by VTI measures 2.14 cm. Pulmonic Valve: The pulmonic valve was grossly normal. Pulmonic valve regurgitation is not visualized. No evidence of pulmonic stenosis. Aorta: The aortic root and ascending aorta are structurally normal, with no evidence of dilitation. Pulmonary Artery: The pulmonary artery is of normal size. Venous: The inferior vena cava was not well visualized. IAS/Shunts: The interatrial septum was not well visualized.  LEFT VENTRICLE PLAX 2D LVIDd:         3.10 cm     Diastology LVIDs:         2.70 cm     LV e' medial:    4.24 cm/s LV PW:         1.10 cm     LV E/e' medial:  15.4 LV IVS:        1.00 cm     LV e' lateral:   4.03 cm/s LVOT diam:     2.00 cm     LV E/e' lateral: 16.3 LV SV:         41 LV SV Index:   27 LVOT Area:     3.14 cm  LV Volumes (MOD) LV vol d, MOD A2C: 22.7 ml LV vol d, MOD A4C: 41.8 ml LV vol s, MOD A2C: 9.0 ml LV vol s, MOD A4C: 16.9 ml LV SV MOD A2C:     13.7 ml LV SV MOD A4C:     41.8 ml LV SV MOD BP:      20.0 ml RIGHT VENTRICLE RV S prime:     14.70 cm/s LEFT ATRIUM           Index LA diam:      2.50 cm 1.62 cm/m LA Vol (A4C): 27.1 ml 17.56 ml/m  AORTIC VALVE                    PULMONIC VALVE AV Area (Vmax):    2.04 cm     PV Vmax:       0.71 m/s AV Area (Vmean):   2.03 cm     PV Vmean:      54.000 cm/s AV Area (VTI):     2.14 cm      PV VTI:        0.125 m AV Vmax:           117.00 cm/s  PV Peak grad:  2.0 mmHg AV Vmean:          80.300 cm/s  PV Mean grad:  1.0 mmHg AV VTI:            0.192 m AV Peak Grad:      5.5 mmHg AV Mean Grad:      3.0 mmHg LVOT Vmax:         75.80 cm/s LVOT Vmean:        52.000 cm/s LVOT VTI:          0.131 m LVOT/AV VTI ratio: 0.68  AORTA Ao Root diam: 3.20 cm MITRAL VALVE MV  Area (PHT): 5.31 cm     SHUNTS MV Decel Time: 143 msec     Systemic VTI:  0.13 m MV E velocity: 65.50 cm/s   Systemic Diam: 2.00 cm MV A velocity: 102.00 cm/s MV E/A ratio:  0.64 Nelva Bush MD Electronically signed by Nelva Bush MD Signature Date/Time: 11/20/2022/6:11:47 PM    Final    CARDIAC CATHETERIZATION  Addendum Date: 11/19/2022     1st Mrg lesion is 100% stenosed.   Prox LAD to Mid LAD lesion is 40% stenosed.   1st Diag lesion is 60% stenosed.   Mid LAD to Dist LAD lesion is 99% stenosed.   RPDA lesion is 85% stenosed.   Prox RCA to Mid RCA lesion is 40% stenosed.   Mid RCA to Dist RCA lesion is 99% stenosed.   A drug-eluting stent was successfully placed using a STENT ONYX FRONTIER 3.5X30.   Post intervention, there is a 0% residual stenosis.   There is moderate left ventricular systolic dysfunction.   LV end diastolic pressure is severely elevated.   The left ventricular ejection fraction is 35-45% by visual estimate. 1.  Severe three-vessel coronary artery disease.  The culprit is a subtotal occlusion of the mid right coronary artery.  In addition, the patient has occluded OM1 with collaterals and diffuse subocclusive disease throughout the mid and distal LAD. 2.  Moderately reduced LV systolic function with severely elevated left ventricular end-diastolic pressure 35 mmHg. 3.  Successful angioplasty and drug-eluting stent placement to the mid right coronary artery. 4.  Door to device was delayed by difficult access via the right radial artery given calcified, tortuous and stenosed proximal radial artery.  This was  navigated successfully with a run-through wire using balloon assisted tracking. Recommendations: Will treat with aspirin and ticagrelor for now. The patient is chronically anticoagulated for atrial fibrillation.  Anticoagulation can likely be resumed tomorrow if no bleeding issues but should consider switching Xarelto to Eliquis and stopping aspirin to minimize the risk of bleeding considering her age. I do not think the LAD is optimal for PCI given long diffuse disease.  Favor medical therapy.  Result Date: 11/19/2022   1st Mrg lesion is 100% stenosed.   Prox LAD to Mid LAD lesion is 40% stenosed.   1st Diag lesion is 60% stenosed.   Mid LAD to Dist LAD lesion is 99% stenosed.   RPDA lesion is 85% stenosed.   Prox RCA to Mid RCA lesion is 40% stenosed.   Mid RCA to Dist RCA lesion is 99% stenosed.   A drug-eluting stent was successfully placed using a STENT ONYX FRONTIER 3.5X30.   Post intervention, there is a 0% residual stenosis.   There is moderate left ventricular systolic dysfunction.   LV end diastolic pressure is severely elevated.   The left ventricular ejection fraction is 35-45% by visual estimate. 1.  Severe three-vessel coronary artery disease.  The culprit is a subtotal occlusion of the mid right coronary artery.  In addition, the patient has occluded OM1 with collaterals and diffuse subocclusive disease throughout the mid and distal LAD. 2.  Moderately reduced LV systolic function with severely elevated left ventricular end-diastolic pressure 35 mmHg. 3.  Successful angioplasty and drug-eluting stent placement to the mid right coronary artery. 4.  Dooe to device was delayed by difficult access via the right radial artery given calcified, tortuous and stenosed proximal radial artery.  This was navigated successfully with a run-through wire using balloon assisted tracking. Recommendations: Will treat with  aspirin and ticagrelor for now. The patient is chronically anticoagulated for atrial  fibrillation.  Anticoagulation can likely be resumed tomorrow if no bleeding issues but should consider switching Xarelto to Eliquis and stopping aspirin to minimize the risk of bleeding considering her age. I do not think the LAD is optimal for PCI given long diffuse disease.  Favor medical therapy.      Labs: BNP (last 3 results) No results for input(s): "BNP" in the last 8760 hours. Basic Metabolic Panel: Recent Labs  Lab 11/18/22 1006 11/19/22 1446 11/20/22 0511 11/21/22 0459 11/22/22 0438  NA 139 139 142 141 142  K 4.8 3.9 4.1 3.6 3.7  CL 100 103 106 107 108  CO2 _0 GLUCOSE 178* 196* 131* 150* 151*  BUN _1 CREATININE 1.00 0.88 0.96 1.05* 0.98  CALCIUM 10.2 9.3 8.8* 8.6* 9.0  MG  --   --   --   --  2.0   Liver Function Tests: Recent Labs  Lab 11/19/22 1446 11/20/22 0511  AST 24 35  ALT 14 15  ALKPHOS 78 54  BILITOT 0.6 0.7  PROT 7.4 5.9*  ALBUMIN 3.9 3.0*   No results for input(s): "LIPASE", "AMYLASE" in the last 168 hours. No results for input(s): "AMMONIA" in the last 168 hours. CBC: Recent Labs  Lab 11/18/22 1006 11/19/22 1446 11/20/22 0511 11/21/22 0459 11/22/22 0438  WBC 11.0* 9.1 6.0 8.0 7.8  NEUTROABS 9.0* 6.2  --   --   --   HGB 16.2* 14.6 11.8* 13.8 14.3  HCT 49.3* 45.3 37.0 42.7 43.7  MCV 88.7 91.0 90.0 90.3 89.4  PLT 273 270 215 232 257   Cardiac Enzymes: No results for input(s): "CKTOTAL", "CKMB", "CKMBINDEX", "TROPONINI" in the last 168 hours. BNP: Invalid input(s): "POCBNP" CBG: Recent Labs  Lab 11/21/22 0821 11/21/22 1240 11/21/22 1622 11/22/22 0729 11/22/22 1132  GLUCAP 142* 153* 107* 147* 128*   D-Dimer No results for input(s): "DDIMER" in the last 72 hours. Hgb A1c Recent Labs    11/19/22 1446  HGBA1C 7.2*   Lipid Profile Recent Labs    11/19/22 1446  CHOL 269*  HDL 51  LDLCALC UNABLE TO CALCULATE IF TRIGLYCERIDE OVER 400 mg/dL  TRIG 477*  CHOLHDL 5.3  LDLDIRECT 154*   Thyroid  function studies No results for input(s): "TSH", "T4TOTAL", "T3FREE", "THYROIDAB" in the last 72 hours.  Invalid input(s): "FREET3" Anemia work up No results for input(s): "VITAMINB12", "FOLATE", "FERRITIN", "TIBC", "IRON", "RETICCTPCT" in the last 72 hours. Urinalysis    Component Value Date/Time   COLORURINE YELLOW 08/23/2021 0957   APPEARANCEUR CLOUDY (A) 08/23/2021 0957   APPEARANCEUR CLOUDY 12/07/2013 1015   LABSPEC 1.015 08/23/2021 0957   LABSPEC 1.010 12/07/2013 1015   PHURINE 7.5 08/23/2021 0957   GLUCOSEU NEGATIVE 08/23/2021 0957   GLUCOSEU NEGATIVE 12/07/2013 1015   HGBUR SMALL (A) 08/23/2021 0957   BILIRUBINUR NEGATIVE 08/23/2021 0957   BILIRUBINUR NEGATIVE 12/07/2013 1015   KETONESUR NEGATIVE 08/23/2021 0957   PROTEINUR 30 (A) 08/23/2021 0957   UROBILINOGEN 0.2 06/03/2021 1524   NITRITE POSITIVE (A) 08/23/2021 0957   LEUKOCYTESUR MODERATE (A) 08/23/2021 0957   LEUKOCYTESUR 2+ 12/07/2013 1015   Sepsis Labs Recent Labs  Lab 11/19/22 1446 11/20/22 0511 11/21/22 0459 11/22/22 0438  WBC 9.1 6.0 8.0 7.8   Microbiology Recent Results (from the past 240 hour(s))  MRSA Next Gen by PCR, Nasal     Status: None   Collection  Time: 11/19/22  4:36 PM   Specimen: Nasal Mucosa; Nasal Swab  Result Value Ref Range Status   MRSA by PCR Next Gen NOT DETECTED NOT DETECTED Final    Comment: (NOTE) The GeneXpert MRSA Assay (FDA approved for NASAL specimens only), is one component of a comprehensive MRSA colonization surveillance program. It is not intended to diagnose MRSA infection nor to guide or monitor treatment for MRSA infections. Test performance is not FDA approved in patients less than 20 years old. Performed at [facility], [address] [place], [place] [address]      Total time spend on discharging this patient, including the last patient exam, discussing the hospital stay, instructions for ongoing care as it relates to all pertinent caregivers, as  well as preparing the medical discharge records, prescriptions, and/or referrals as applicable, is 40 minutes.    Enzo Bi, MD  [facility] 11/22/2022, 2:21 PM

## 2022-11-22 NOTE — Progress Notes (Signed)
Progress Note  Patient Name: Vanessa Young Date of Encounter: 11/22/2022  Primary Cardiologist: Ubaldo Glassing - the patient requested to transfer to [facility]  Subjective   No chest pain or dyspnea. Has ambulated without cardiac issues. BP remains elevated.   Inpatient Medications    Scheduled Meds:  amLODipine  5 mg Oral Daily   apixaban  5 mg Oral BID   carvedilol  25 mg Oral BID WC   Chlorhexidine Gluconate Cloth  6 each Topical Daily   clopidogrel  75 mg Oral Daily   escitalopram  10 mg Oral QHS   influenza vaccine adjuvanted  0.5 mL Intramuscular Tomorrow-1000   insulin aspart  0-15 Units Subcutaneous TID WC   losartan  100 mg Oral Daily   magnesium oxide  400 mg Oral Daily   pantoprazole  40 mg Oral BID AC   rosuvastatin  40 mg Oral Daily   sodium chloride flush  3 mL Intravenous Q12H   sodium chloride flush  3 mL Intravenous Q12H   Continuous Infusions:  sodium chloride     PRN Meds: sodium chloride, acetaminophen, ALPRAZolam, ondansetron (ZOFRAN) IV, polyethylene glycol, sodium chloride flush, traMADol   Vital Signs    Vitals:   11/22/22 0200 11/22/22 0400 11/22/22 0416 11/22/22 0500  BP: (!) 156/92  (!) 169/99   Pulse: 85  97   Resp: 20  16   Temp:  98 F (36.7 C)    TempSrc:  Oral    SpO2: 94%  91%   Weight:    62.4 kg  Height:        Intake/Output Summary (Last 24 hours) at 11/22/2022 0758 Last data filed at 11/21/2022 2149 Gross per 24 hour  Intake 363 ml  Output --  Net 363 ml   Filed Weights   11/20/22 0500 11/21/22 0500 11/22/22 0500  Weight: 63.3 kg 62.8 kg 62.4 kg    Telemetry    SR with PACs - Personally Reviewed  ECG    Nno new tracings - Personally Reviewed  Physical Exam   GEN: No acute distress.   Neck: No JVD. Cardiac: RRR, no murmurs, rubs, or gallops.  Respiratory: Clear to auscultation bilaterally.  GI: Soft, nontender, non-distended.   MS: No edema; No deformity. Neuro:  Alert and oriented x 3; Nonfocal.   Psych: Normal affect.  Labs    Chemistry Recent Labs  Lab 11/19/22 1446 11/20/22 0511 11/21/22 0459 11/22/22 0438  NA 139 142 141 142  K 3.9 4.1 3.6 3.7  CL 103 106 107 108  CO2 '28 30 27 25  '$ GLUCOSE 196* 131* 150* 151*  BUN '19 15 17 22  '$ CREATININE 0.88 0.96 1.05* 0.98  CALCIUM 9.3 8.8* 8.6* 9.0  PROT 7.4 5.9*  --   --   ALBUMIN 3.9 3.0*  --   --   AST 24 35  --   --   ALT 14 15  --   --   ALKPHOS 78 54  --   --   BILITOT 0.6 0.7  --   --   GFRNONAA >60 59* 53* 58*  ANIONGAP '8 6 7 9     '$ Hematology Recent Labs  Lab 11/20/22 0511 11/21/22 0459 11/22/22 0438  WBC 6.0 8.0 7.8  RBC 4.11 4.73 4.89  HGB 11.8* 13.8 14.3  HCT 37.0 42.7 43.7  MCV 90.0 90.3 89.4  MCH 28.7 29.2 29.2  MCHC 31.9 32.3 32.7  RDW 12.9 12.8 12.7  PLT 215 232  257    Cardiac EnzymesNo results for input(s): "TROPONINI" in the last 168 hours. No results for input(s): "TROPIPOC" in the last 168 hours.   BNPNo results for input(s): "BNP", "PROBNP" in the last 168 hours.   DDimer No results for input(s): "DDIMER" in the last 168 hours.   Radiology     Cardiac Studies   2D echo 11/20/2022: 1. Left ventricular ejection fraction, by estimation, is 60 to 65%. The  left ventricle has normal function. The left ventricle demonstrates  regional wall motion abnormalities (see scoring diagram/findings for  description). There is mild left ventricular   hypertrophy. Left ventricular diastolic parameters are consistent with  Grade I diastolic dysfunction (impaired relaxation). Elevated left atrial  pressure. There is mild hypokinesis of the left ventricular, basal-mid  inferior wall and inferolateral wall.   2. Right ventricular systolic function is normal. The right ventricular  size is normal. Tricuspid regurgitation signal is inadequate for assessing  PA pressure.   3. The mitral valve is normal in structure. No evidence of mitral valve  regurgitation. No evidence of mitral stenosis.   4. The  aortic valve is tricuspid. Aortic valve regurgitation is trivial.  No aortic stenosis is present.  __________  LHC 11/19/2022:   1st Mrg lesion is 100% stenosed.   Prox LAD to Mid LAD lesion is 40% stenosed.   1st Diag lesion is 60% stenosed.   Mid LAD to Dist LAD lesion is 99% stenosed.   RPDA lesion is 85% stenosed.   Prox RCA to Mid RCA lesion is 40% stenosed.   Mid RCA to Dist RCA lesion is 99% stenosed.   A drug-eluting stent was successfully placed using a STENT ONYX FRONTIER 3.5X30.   Post intervention, there is a 0% residual stenosis.   There is moderate left ventricular systolic dysfunction.   LV end diastolic pressure is severely elevated.   The left ventricular ejection fraction is 35-45% by visual estimate.   1.  Severe three-vessel coronary artery disease.  The culprit is a subtotal occlusion of the mid right coronary artery.  In addition, the patient has occluded OM1 with collaterals and diffuse subocclusive disease throughout the mid and distal LAD. 2.  Moderately reduced LV systolic function with severely elevated left ventricular end-diastolic pressure 35 mmHg. 3.  Successful angioplasty and drug-eluting stent placement to the mid right coronary artery. 4.  Door to device was delayed by difficult access via the right radial artery given calcified, tortuous and stenosed proximal radial artery.  This was navigated successfully with a run-through wire using balloon assisted tracking.   Recommendations: Will treat with aspirin and ticagrelor for now. The patient is chronically anticoagulated for atrial fibrillation.  Anticoagulation can likely be resumed tomorrow if no bleeding issues but should consider switching Xarelto to Eliquis and stopping aspirin to minimize the risk of bleeding considering her age. I do not think the LAD is optimal for PCI given long diffuse disease.  Favor medical therapy.  Patient Profile     81 y.o. female with history of PAF on [place], DM2, HTN, and  HLD who was admitted with an inferior STEMI s/p PCI/DES to the RCA.  Assessment & Plan    1. Inferior STEMI: -No angina -Status post PCI/DES to the mid RCA on 11/19/2022 -Medical management of residual disease  -LVEF normal by echo this admission -Plavix with Eliquis in place of ASA given Afib to minimize bleeding risk -Coreg, Crestor -Cardiac rehab ordered -Post cath instructions  2. PAF: -Maintaining  sinus rhythm -CHADS2VASc at least 6 (HTN, age x 2, DM, vascular disease, sex category) -She has been transitioned from PTA Xarelto to Eliquis 5 mg bid (does not meet reduced dosing criteria) to minimize the risk of bleeding with addition of Plavix given her age -Coreg  3. HTN: -Blood pressure remains elevated -Coreg has been titrated this morning to 25 mg bid, add amlodipine 5 mg daily -Continue losartan 100 mg -If BP improves, she would be stable for discharge from a cardiac perspective  4. HLD: -LDL 154 with goal being < 55 -Now on Crestor 40 mg (PTA on 5 mg of Crestor) -Triglycerides 477 -Look to consider addition of Vascepa in the office -Repeat FLP and LFT in 2 months with recommendation to escalate lipid therapy as indicated to achieve target LDL  5. Hypomagnesemia: -Repleted   6. DM2: -A1c 7.2 -SSI per IM -Follow up with PCP      For questions or updates, please contact [place] Please consult [email or web address] for contact info under Cardiology/STEMI.    Signed, Vanessa Faith, PA-C [place] Pager: [phone] 11/22/2022, 7:58 AM

## 2022-11-22 NOTE — Plan of Care (Signed)

## 2022-11-22 NOTE — TOC Transition Note (Signed)
Transition of Care [facility]) - CM/SW Discharge Note   Patient Details  Name: Vanessa Young MRN: [id number] Date of Birth: 21-Mar-1941  Transition of Care [facility]) CM/SW Contact:  Shelbie Hutching, RN Phone Number: 11/22/2022, 10:55 AM   Clinical Narrative:    Patient cleared for cardiology for discharge.  Patient will go for cardiac rehab outpatient.  PT has recommended a RW.  Patient does not have a walker at home.  RNCM has ordered one from [employer] to be delivered to the bedside today before patient goes home.  Patient's daughter will be transporting her home.    Final next level of care: OP Rehab Barriers to Discharge: Barriers Resolved   Patient Goals and CMS Choice Patient states their goals for this hospitalization and ongoing recovery are:: Ready to get home and excited to start Cardiac rehab      Discharge Placement                       Discharge Plan and Services                DME Arranged: Walker rolling DME Agency: [employer] Date DME Agency Contacted: 11/22/22 Time DME Agency Contacted: 39 Representative spoke with at DME Agency: [place] Arranged: NA          Social Determinants of Health ([place]) Interventions     Readmission Risk Interventions     No data to display

## 2022-11-22 NOTE — Progress Notes (Addendum)
Physical Therapy Treatment Patient Details Name: Vanessa Young MRN: [id number] DOB: [birth year] Today's Date: 11/22/2022   History of Present Illness Patient is a 81 year old female presenting with chest pain. Found to have 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 s/p cardiac cath.    PT Comments    Patient is agreeable to PT. Her daughter was at the bedside and requested that patient have a rolling walker due to recent unsteadiness. The patient reports she is independent at baseline and lives at home alone, however has been more unsteady with ambulation recently.  Today, the patient ambulated a short distance in the room without a device and required occasional Min guard assistance with mild unsteadiness with turns. With using rolling walker, patient has improved balance and gait pattern. Recommended to use the rolling walker for safety with mobility. The patient is hopeful for discharge home today. Anticipate patient can return home with rolling walker for ambulation. She is also hopeful to start cardiac rehab as soon as possible. PT will follow up while in the hospital to maximize independence.    Recommendations for follow up therapy are one component of a multi-disciplinary discharge planning process, led by the attending physician.  Recommendations may be updated based on patient status, additional functional criteria and insurance authorization.  Follow Up Recommendations  Follow physician's recommendations for discharge plan and follow up therapies     Assistance Recommended at Discharge PRN  Patient can return home with the following Assist for transportation   Equipment Recommendations  Rolling walker (2 wheels)    Recommendations for Other Services       Precautions / Restrictions Precautions Precautions: Fall Restrictions Weight Bearing Restrictions: No     Mobility  Bed Mobility               General bed mobility  comments: not observed as patient sitting up on arrival and post session    Transfers Overall transfer level: Needs assistance Equipment used: None Transfers: Sit to/from Stand Sit to Stand: Modified independent (Device/Increase time)           General transfer comment: 2 standing bouts performed    Ambulation/Gait Ambulation/Gait assistance: Min guard, Modified independent (Device/Increase time) (Mod I with rolling walker, Min guard without RW) Gait Distance (Feet): 150 Feet Assistive device: Rolling walker (2 wheels), None Gait Pattern/deviations: Step-through pattern Gait velocity: decreased     General Gait Details: patient ambulated a short distance in the room with Min guard assistance and occasional unsteadiness. therapist demonstrated cues for how to use rolling walker and patient was agreeable to try. gait pattern and balance improved with using rolling walker (Mod I). encouraged patient to use rolling walker for safety with mobility   Stairs             Wheelchair Mobility    Modified Rankin (Stroke Patients Only)       Balance Overall balance assessment: Needs assistance Sitting-balance support: Feet supported Sitting balance-Leahy Scale: Good     Standing balance support: No upper extremity supported Standing balance-Leahy Scale: Fair                              Cognition Arousal/Alertness: Awake/alert Behavior During Therapy: WFL for tasks assessed/performed Overall Cognitive Status: Within Functional Limits for tasks assessed  Exercises      General Comments General comments (skin integrity, edema, etc.): no shortness of breath is noted with activity. educated patient on energy conservation techniques, progressing activity slowly, and encouraged patient to go to cardiac rehab as instructed by the cardiologist.      Pertinent Vitals/Pain Pain Assessment Pain  Assessment: No/denies pain    Home Living Family/patient expects to be discharged to:: Private residence Living Arrangements: Alone Available Help at Discharge: Family   Home Access: Level entry                Prior Function            PT Goals (current goals can now be found in the care plan section) Acute Rehab PT Goals Patient Stated Goal: to go home PT Goal Formulation: With patient/family Time For Goal Achievement: 12/06/22 Potential to Achieve Goals: Good Additional Goals Additional Goal #1: with supervision, patient will maintain standing balance without UE support while performing a functional task to decrease risk for falls    Frequency    Min 2X/week      PT Plan      Co-evaluation              AM-PAC PT "6 Clicks" Mobility   Outcome Measure  Help needed turning from your back to your side while in a flat bed without using bedrails?: None Help needed moving from lying on your back to sitting on the side of a flat bed without using bedrails?: None Help needed moving to and from a bed to a chair (including a wheelchair)?: None Help needed standing up from a chair using your arms (e.g., wheelchair or bedside chair)?: None Help needed to walk in hospital room?: None Help needed climbing 3-5 steps with a railing? : A Little 6 Click Score: 23    End of Session   Activity Tolerance: Patient tolerated treatment well Patient left: in bed;with call bell/phone within reach;with family/visitor present;with nursing/sitter in room (sitting on edge of bed, daughter and nurse present) Nurse Communication: Mobility status PT Visit Diagnosis: Muscle weakness (generalized) (M62.81);Unsteadiness on feet (R26.81)     Time: 9937-1696 PT Time Calculation (min) (ACUTE ONLY): 17 min  Charges:  $Therapeutic Activity: 8-22 mins                     Minna Merritts, PT, MPT    Percell Locus 11/22/2022, 12:43 PM

## 2022-11-23 ENCOUNTER — Emergency Department: Payer: Medicare PPO

## 2022-11-23 ENCOUNTER — Other Ambulatory Visit: Payer: Self-pay

## 2022-11-23 ENCOUNTER — Observation Stay
Admission: EM | Admit: 2022-11-23 | Discharge: 2022-11-24 | Disposition: A | Discharge status: Home / Self Care | Payer: Medicare PPO | Attending: Internal Medicine | Admitting: Internal Medicine

## 2022-11-23 DIAGNOSIS — Z8673 Personal history of transient ischemic attack (TIA), and cerebral infarction without residual deficits: Secondary | ICD-10-CM | POA: Diagnosis not present

## 2022-11-23 DIAGNOSIS — G459 Transient cerebral ischemic attack, unspecified: Secondary | ICD-10-CM | POA: Diagnosis not present

## 2022-11-23 DIAGNOSIS — I480 Paroxysmal atrial fibrillation: Secondary | ICD-10-CM | POA: Insufficient documentation

## 2022-11-23 DIAGNOSIS — N179 Acute kidney failure, unspecified: Secondary | ICD-10-CM | POA: Diagnosis not present

## 2022-11-23 DIAGNOSIS — Z6825 Body mass index (BMI) 25.0-25.9, adult: Secondary | ICD-10-CM | POA: Insufficient documentation

## 2022-11-23 DIAGNOSIS — E663 Overweight: Secondary | ICD-10-CM | POA: Insufficient documentation

## 2022-11-23 DIAGNOSIS — Z7901 Long term (current) use of anticoagulants: Secondary | ICD-10-CM | POA: Insufficient documentation

## 2022-11-23 DIAGNOSIS — I10 Essential (primary) hypertension: Secondary | ICD-10-CM | POA: Diagnosis present

## 2022-11-23 DIAGNOSIS — R531 Weakness: Secondary | ICD-10-CM | POA: Diagnosis present

## 2022-11-23 DIAGNOSIS — I639 Cerebral infarction, unspecified: Secondary | ICD-10-CM | POA: Diagnosis not present

## 2022-11-23 DIAGNOSIS — M6281 Muscle weakness (generalized): Secondary | ICD-10-CM | POA: Insufficient documentation

## 2022-11-23 DIAGNOSIS — Z79899 Other long term (current) drug therapy: Secondary | ICD-10-CM | POA: Insufficient documentation

## 2022-11-23 DIAGNOSIS — Z7902 Long term (current) use of antithrombotics/antiplatelets: Secondary | ICD-10-CM | POA: Insufficient documentation

## 2022-11-23 DIAGNOSIS — K219 Gastro-esophageal reflux disease without esophagitis: Secondary | ICD-10-CM | POA: Diagnosis present

## 2022-11-23 DIAGNOSIS — E119 Type 2 diabetes mellitus without complications: Secondary | ICD-10-CM

## 2022-11-23 DIAGNOSIS — Z955 Presence of coronary angioplasty implant and graft: Secondary | ICD-10-CM | POA: Diagnosis not present

## 2022-11-23 DIAGNOSIS — R2681 Unsteadiness on feet: Secondary | ICD-10-CM | POA: Insufficient documentation

## 2022-11-23 DIAGNOSIS — F411 Generalized anxiety disorder: Secondary | ICD-10-CM | POA: Diagnosis present

## 2022-11-23 DIAGNOSIS — Z87891 Personal history of nicotine dependence: Secondary | ICD-10-CM | POA: Insufficient documentation

## 2022-11-23 LAB — CBC
HCT: 40.5 % (ref 36.0–46.0)
Hemoglobin: 13.2 g/dL (ref 12.0–15.0)
MCH: 29.3 pg (ref 26.0–34.0)
MCHC: 32.6 g/dL (ref 30.0–36.0)
MCV: 90 fL (ref 80.0–100.0)
Platelets: 277 10*3/uL (ref 150–400)
RBC: 4.5 MIL/uL (ref 3.87–5.11)
RDW: 13 % (ref 11.5–15.5)
WBC: 7.4 10*3/uL (ref 4.0–10.5)
nRBC: 0 % (ref 0.0–0.2)

## 2022-11-23 LAB — CBG MONITORING, ED
Glucose-Capillary: 154 mg/dL — ABNORMAL HIGH (ref 70–99)
Glucose-Capillary: 166 mg/dL — ABNORMAL HIGH (ref 70–99)
Glucose-Capillary: 191 mg/dL — ABNORMAL HIGH (ref 70–99)

## 2022-11-23 LAB — COMPREHENSIVE METABOLIC PANEL
ALT: 17 U/L (ref 0–44)
AST: 27 U/L (ref 15–41)
Albumin: 3.5 g/dL (ref 3.5–5.0)
Alkaline Phosphatase: 66 U/L (ref 38–126)
Anion gap: 9 (ref 5–15)
BUN: 36 mg/dL — ABNORMAL HIGH (ref 8–23)
CO2: 24 mmol/L (ref 22–32)
Calcium: 8.8 mg/dL — ABNORMAL LOW (ref 8.9–10.3)
Chloride: 107 mmol/L (ref 98–111)
Creatinine, Ser: 1.46 mg/dL — ABNORMAL HIGH (ref 0.44–1.00)
GFR, Estimated: 36 mL/min — ABNORMAL LOW (ref >60)
Glucose, Bld: 170 mg/dL — ABNORMAL HIGH (ref 70–99)
Potassium: 4.3 mmol/L (ref 3.5–5.1)
Sodium: 140 mmol/L (ref 135–145)
Total Bilirubin: 0.6 mg/dL (ref 0.3–1.2)
Total Protein: 6.8 g/dL (ref 6.5–8.1)

## 2022-11-23 LAB — PROTIME-INR
INR: 1.5 — ABNORMAL HIGH (ref 0.8–1.2)
Prothrombin Time: 18.3 seconds — ABNORMAL HIGH (ref 11.4–15.2)

## 2022-11-23 LAB — DIFFERENTIAL
Abs Immature Granulocytes: 0.04 10*3/uL (ref 0.00–0.07)
Basophils Absolute: 0 10*3/uL (ref 0.0–0.1)
Basophils Relative: 1 %
Eosinophils Absolute: 0.3 10*3/uL (ref 0.0–0.5)
Eosinophils Relative: 4 %
Immature Granulocytes: 1 %
Lymphocytes Relative: 13 %
Lymphs Abs: 1 10*3/uL (ref 0.7–4.0)
Monocytes Absolute: 0.7 10*3/uL (ref 0.1–1.0)
Monocytes Relative: 9 %
Neutro Abs: 5.4 10*3/uL (ref 1.7–7.7)
Neutrophils Relative %: 72 %

## 2022-11-23 LAB — APTT: aPTT: 34 seconds (ref 24–36)

## 2022-11-23 LAB — ETHANOL: Alcohol, Ethyl (B): <10 mg/dL (ref <10)

## 2022-11-23 MED ORDER — MAGNESIUM OXIDE -MG SUPPLEMENT 400 (240 MG) MG PO TABS
400.0000 mg | ORAL_TABLET | Freq: Every day | ORAL | Status: DC
  Administered 2022-11-24: 400 mg via ORAL
  Filled 2022-11-23: qty 1

## 2022-11-23 MED ORDER — SENNOSIDES-DOCUSATE SODIUM 8.6-50 MG PO TABS: 1.0000 | ORAL_TABLET | Freq: Every evening | ORAL | Status: DC | PRN

## 2022-11-23 MED ORDER — ALPRAZOLAM 0.25 MG PO TABS
0.2500 mg | ORAL_TABLET | Freq: Every day | ORAL | Status: DC | PRN
  Administered 2022-11-23: 0.25 mg via ORAL
  Filled 2022-11-23: qty 1

## 2022-11-23 MED ORDER — STROKE: EARLY STAGES OF RECOVERY BOOK: Freq: Once | Status: DC

## 2022-11-23 MED ORDER — ACETAMINOPHEN 160 MG/5ML PO SOLN: 650.0000 mg | ORAL | Status: DC | PRN

## 2022-11-23 MED ORDER — CLOPIDOGREL BISULFATE 75 MG PO TABS
75.0000 mg | ORAL_TABLET | Freq: Every day | ORAL | Status: DC
  Administered 2022-11-24: 75 mg via ORAL
  Filled 2022-11-23: qty 1

## 2022-11-23 MED ORDER — ALLOPURINOL 100 MG PO TABS: 100.0000 mg | ORAL_TABLET | Freq: Every day | ORAL | Status: DC

## 2022-11-23 MED ORDER — IOHEXOL 350 MG/ML SOLN
75.0000 mL | Freq: Once | INTRAVENOUS | Status: AC | PRN
  Administered 2022-11-23: 75 mL via INTRAVENOUS

## 2022-11-23 MED ORDER — ESCITALOPRAM OXALATE 10 MG PO TABS
10.0000 mg | ORAL_TABLET | Freq: Every day | ORAL | Status: DC
  Administered 2022-11-23: 10 mg via ORAL
  Filled 2022-11-23: qty 1

## 2022-11-23 MED ORDER — ESCITALOPRAM OXALATE 10 MG PO TABS: 5.0000 mg | ORAL_TABLET | Freq: Every day | ORAL | Status: DC

## 2022-11-23 MED ORDER — ACETAMINOPHEN 650 MG RE SUPP: 650.0000 mg | RECTAL | Status: DC | PRN

## 2022-11-23 MED ORDER — SODIUM CHLORIDE 0.9% FLUSH
3.0000 mL | Freq: Once | INTRAVENOUS | Status: AC
  Administered 2022-11-23: 3 mL via INTRAVENOUS

## 2022-11-23 MED ORDER — ASPIRIN 81 MG PO TBEC
81.0000 mg | DELAYED_RELEASE_TABLET | Freq: Every day | ORAL | Status: DC
  Administered 2022-11-24: 81 mg via ORAL
  Filled 2022-11-23: qty 1

## 2022-11-23 MED ORDER — ACETAMINOPHEN 325 MG PO TABS: 650.0000 mg | ORAL_TABLET | ORAL | Status: DC | PRN

## 2022-11-23 MED ORDER — ROSUVASTATIN CALCIUM 20 MG PO TABS
40.0000 mg | ORAL_TABLET | Freq: Every day | ORAL | Status: DC
  Administered 2022-11-24: 40 mg via ORAL
  Filled 2022-11-23: qty 2

## 2022-11-23 NOTE — Assessment & Plan Note (Signed)
Will allow for permissive hypertension Hold amlodipine, carvedilol and losartan

## 2022-11-23 NOTE — Assessment & Plan Note (Signed)
Unclear etiology Patient has a baseline serum creatinine of 0.98 and today on admission it is 1.46 Will hold losartan.  Recheck before d/c

## 2022-11-23 NOTE — Assessment & Plan Note (Addendum)
Probable embolic Patient presents to the ER for evaluation of sudden onset left-sided weakness associated with expressive aphasia and dysarthria all of which have resolved. She has a history of paroxysmal A-fib and is on chronic anticoagulation therapy with apixaban Initial CT scan of the head without contrast showed advanced chronic small-vessel ischemic changes of the cerebral hemispheric white matter. 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 CT angiogram of the head and neck was negative for large vessel occlusion  Patient was not a candidate for thrombolytics since she is on chronic anticoagulation MRI of the brain without contrast showed 3 small foci of acute infarcts in the posterior right frontal lobe, right parietal lobe and left frontal lobe. Given distribution, these are favored to be secondary to a central embolic source. Discussed with neurology and will hold apixaban for now Allow for permissive hypertension Continue Plavix and high intensity statin We will request PT/OT/ST consult Patient had a recent 2D echocardiogram which showed an LVEF of 60 to 65% and evidence of grade 1 diastolic dysfunction.  Mild hypokinesis of left ventricle,, basal mid inferior wall and inferolateral wall.

## 2022-11-23 NOTE — Assessment & Plan Note (Signed)
Stable.  Continue PPI. 

## 2022-11-23 NOTE — ED Triage Notes (Signed)
BIBA for right sided weakness, LKW at 1205pm today.  Pt takes eliquis.

## 2022-11-23 NOTE — H&P (Addendum)
History and Physical    Patient: Vanessa Young:[id number] DOB: 01-26-1941 DOA: 11/23/2022 DOS: the patient was seen and examined on 11/23/2022 PCP: Myrtie Soman, MD  Patient coming from: Home  Chief Complaint:  Chief Complaint  Patient presents with   Weakness   HPI: Vanessa Young is a 81 y.o. female with medical history significant for depression, paroxysmal A-fib on chronic anticoagulation therapy with Eliquis, diabetes mellitus, GERD, hypertension, dyslipidemia, coronary artery disease status post recent STEMI with stent angioplasty who presents to the ER via EMS for evaluation of right-sided weakness, blurred vision and word finding difficulty.  Patient's last known well was about 1210 p.m. She arrived to the emergency room at about 1 and a code stroke was called.  Patient was noted to have right hemianopia, right facial droop, expressive aphasia and dysarthria on exam.  She was not a candidate for thrombolytics as she is on Eliquis.  Not a candidate for IR as there was no LVO noted on imaging. Patient's symptoms have resolved and she appears to be back to baseline. She denies having any chest pain, no shortness of breath, no nausea, no vomiting, no headache, no dizziness, no lightheadedness, no urinary symptoms, no changes in her bowel habits, no leg swelling, no blurred vision, no focal deficit. CT scan of the head without contrast shows no acute CT finding. Advanced chronic small-vessel ischemic changes of the cerebral hemispheric white matter. 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 Aspects is 10. CT angiogram of the head and neck shows no intracranial large vessel occlusion. Moderate stenosis in the right cavernous ICA and right P 2 segment. 50% stenosis of the proximal right ICA. No other hemodynamically significant stenosis in the neck. Aortic atherosclerosis. MRI of the brain without contrast showed 3 small foci of acute infarcts in the posterior right frontal lobe, right parietal  lobe and left frontal lobe. Given distribution, these are favored to be secondary to a central embolic source. Severe chronic microvascular ischemic changes of the white matter. Twelve-lead EKG reviewed by me shows sinus rhythm with nonspecific T wave changes She will be referred to observation status for further evaluation  Review of Systems: As mentioned in the history of present illness. All other systems reviewed and are negative. Past Medical History:  Diagnosis Date   A-fib [facility])    Depression    Diabetes mellitus without complication (HCC)    GERD (gastroesophageal reflux disease)    Hypercholesteremia    Hypertension    Past Surgical History:  Procedure Laterality Date   CORONARY/GRAFT ACUTE MI REVASCULARIZATION N/A 11/19/2022   Procedure: Coronary/Graft Acute MI Revascularization;  Surgeon: Wellington Hampshire, MD;  Location: [facility];  Service: Cardiovascular;  Laterality: N/A;   CYSTOSCOPY     LEFT HEART CATH AND CORONARY ANGIOGRAPHY N/A 11/19/2022   Procedure: LEFT HEART CATH AND CORONARY ANGIOGRAPHY;  Surgeon: Wellington Hampshire, MD;  Location: [facility];  Service: Cardiovascular;  Laterality: N/A;   Social History:  reports that she quit smoking about 21 years ago. Her smoking use included cigarettes. She has a 5.00 pack-year smoking history. She has never used smokeless tobacco. She reports that she does not drink alcohol and does not use drugs.  Allergies  Allergen Reactions   Keflex [Cephalexin] Hives   Hydrochlorothiazide W-Triamterene Nausea Only   Lisinopril Other (See Comments)    Unknown     Penicillins Hives   Statins Other (See Comments)    Unknown  Family History  Problem Relation Age of Onset   CVA Mother    Diabetes Mother    CVA Father     Prior to Admission medications   Medication Sig Start Date End Date Taking? Authorizing Provider  ACCU-CHEK GUIDE test strip  01/03/22   [provider]  allopurinol (ZYLOPRIM)  100 MG tablet Take by mouth. 09/14/21   [provider]  ALPRAZolam Duanne Moron) 0.25 MG tablet Take by mouth. 02/28/22   [provider]  amLODipine (NORVASC) 5 MG tablet Take 1 tablet (5 mg total) by mouth daily. 11/23/22 02/21/23  Enzo Bi, MD  apixaban (ELIQUIS) 5 MG TABS tablet Take 1 tablet (5 mg total) by mouth 2 (two) times daily. 11/22/22 02/20/23  Enzo Bi, MD  carvedilol (COREG) 25 MG tablet Take 1 tablet (25 mg total) by mouth 2 (two) times daily with a meal. 11/22/22 02/20/23  Enzo Bi, MD  clopidogrel (PLAVIX) 75 MG tablet Take 1 tablet (75 mg total) by mouth daily. 11/23/22 02/21/23  Enzo Bi, MD  escitalopram (LEXAPRO) 5 MG tablet Take 5 mg by mouth at bedtime.     [provider]  guanFACINE (TENEX) 1 MG tablet Take 1 mg by mouth 2 (two) times daily.     [provider]  losartan (COZAAR) 100 MG tablet Take 100 mg by mouth daily.    [provider]  magnesium oxide (MAG-OX) 400 (240 Mg) MG tablet Take 1 tablet by mouth daily. 05/16/21   [provider]  metFORMIN (GLUMETZA) 500 MG (MOD) 24 hr tablet Take 500 mg by mouth daily with breakfast.    [provider]  rosuvastatin (CRESTOR) 40 MG tablet Take 1 tablet (40 mg total) by mouth daily. 11/23/22 02/21/23  Enzo Bi, MD    Physical Exam: Vitals:   11/23/22 1600 11/23/22 1630 11/23/22 1700 11/23/22 1730  BP: (!) 150/97 (!) 154/87 (!) 149/84 (!) 153/91  Pulse: 68 66 (!) 52 79  Resp: 13 20 (!) 21 19  Temp:      TempSrc:      SpO2: 100% 97% 97% 98%  Weight:      Height:       Physical Exam Vitals and nursing note reviewed.  Constitutional:      Appearance: Normal appearance.  HENT:     Head: Normocephalic.     Nose: Nose normal.     Mouth/Throat:     Mouth: Mucous membranes are moist.  Eyes:     Conjunctiva/sclera: Conjunctivae normal.  Cardiovascular:     Rate and Rhythm: Normal rate.  Pulmonary:     Effort: Pulmonary effort is normal.     Breath sounds: Normal  breath sounds.  Abdominal:     General: Abdomen is flat. Bowel sounds are normal.     Palpations: Abdomen is soft.  Musculoskeletal:        General: Normal range of motion.     Cervical back: Normal range of motion and neck supple.  Skin:    General: Skin is warm and dry.  Neurological:     General: No focal deficit present.     Mental Status: She is alert and oriented to person, place, and time.     Comments: Able to move all extremities.  No facial asymmetry.  Psychiatric:        Mood and Affect: Mood normal.        Behavior: Behavior normal.     Data Reviewed: Relevant notes from primary care and specialist  visits, past discharge summaries as available in EHR, including Care Everywhere. Prior diagnostic testing as pertinent to current admission diagnoses Updated medications and problem lists for reconciliation ED course, including vitals, labs, imaging, treatment and response to treatment Triage notes, nursing and pharmacy notes and ED provider's notes Notable results as noted in HPI Labs reviewed.  Sodium 140, potassium 4.3, chloride 107, bicarb 24, glucose 170, BUN 36, creatinine 1.46 compared to baseline of 0.98, calcium 8.8, total protein 6.8, albumin 3.5, AST 27, ALT 17, alkaline phosphatase 66, total bilirubin 0.6, PT 18.3, INR 1.5, white count 7.4, hemoglobin 13.2, hematocrit 40.5, platelet count 277 There are no new results to review at this time.  Assessment and Plan: * Acute CVA (cerebrovascular accident) ([place]) Probable embolic Patient presents to the ER for evaluation of sudden onset left-sided weakness associated with expressive aphasia and dysarthria all of which have resolved. She has a history of paroxysmal A-fib and is on chronic anticoagulation therapy with apixaban Initial CT scan of the head without contrast showed advanced chronic small-vessel ischemic changes of the cerebral hemispheric white matter. 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 CT angiogram  of the head and neck was negative for large vessel occlusion  Patient was not a candidate for thrombolytics since she is on chronic anticoagulation MRI of the brain without contrast showed 3 small foci of acute infarcts in the posterior right frontal lobe, right parietal lobe and left frontal lobe. Given distribution, these are favored to be secondary to a central embolic source. Discussed with neurology and will hold apixaban for now Allow for permissive hypertension Continue Plavix and high intensity statin We will request PT/OT/ST consult Patient had a recent 2D echocardiogram which showed an LVEF of 60 to 65% and evidence of grade 1 diastolic dysfunction.  Mild hypokinesis of left ventricle,, basal mid inferior wall and inferolateral wall.    AKI (acute kidney injury) ([place]) Unclear etiology Patient has a baseline serum creatinine of 0.98 and today on admission it is 1.46 Will hold losartan Hydrate patient and repeat renal parameters in a.m. Avoid nephro toxic agents  PAF (paroxysmal atrial fibrillation) (HCC) Hold carvedilol for now Hold apixaban due to acute stroke We will resume per neurology recommendation  Primary hypertension Will allow for permissive hypertension Hold amlodipine, carvedilol and losartan  GERD (gastroesophageal reflux disease) Stable Continue PPI  Diabetes mellitus without complication ([place]) Hold metformin Keep patient n.p.o. until swallow function has been assessed Check blood sugars every 4 hours while NPO  Generalized anxiety disorder Continue as needed Xanax      Advance Care Planning:   Code Status: Full Code   Consults: Neurology  Family Communication: Greater than 50% of time was spent discussing patient's condition and plan of care with her daughter at the bedside.  All questions and concerns have been addressed.  They verbalized understanding and agree with the plan.    Severity of Illness: The appropriate patient status for this  patient is OBSERVATION. Observation status is judged to be reasonable and necessary in order to provide the required intensity of service to ensure the patient's safety. The patient's presenting symptoms, physical exam findings, and initial radiographic and laboratory data in the context of their medical condition is felt to place them at decreased risk for further clinical deterioration. Furthermore, it is anticipated that the patient will be medically stable for discharge from the hospital within 2 midnights of admission.   Author: Collier Bullock, MD 11/23/2022 6:01 PM  For on call review [email or web address].

## 2022-11-23 NOTE — Assessment & Plan Note (Signed)
-   Continue as needed Xanax 

## 2022-11-23 NOTE — Progress Notes (Signed)
CODE STROKE- PHARMACY COMMUNICATION   Time CODE STROKE called/page received:1304  Time response to CODE STROKE was made (in person): 1307   Time Stroke Kit retrieved from [employer]: not required  Name of Provider/Nurse contacted: Merleen Nicely, RN  Past Medical History:  Diagnosis Date   A-fib [facility])    Depression    Diabetes mellitus without complication ([place])    GERD (gastroesophageal reflux disease)    Hypercholesteremia    Hypertension    Prior to Admission medications   Medication Sig Start Date End Date Taking? Authorizing Provider  ACCU-CHEK GUIDE test strip  01/03/22   [provider]  allopurinol (ZYLOPRIM) 100 MG tablet Take by mouth. 09/14/21   [provider]  ALPRAZolam Duanne Moron) 0.25 MG tablet Take by mouth. 02/28/22   [provider]  amLODipine (NORVASC) 5 MG tablet Take 1 tablet (5 mg total) by mouth daily. 11/23/22 02/21/23  Enzo Bi, MD  apixaban (ELIQUIS) 5 MG TABS tablet Take 1 tablet (5 mg total) by mouth 2 (two) times daily. 11/22/22 02/20/23  Enzo Bi, MD  carvedilol (COREG) 25 MG tablet Take 1 tablet (25 mg total) by mouth 2 (two) times daily with a meal. 11/22/22 02/20/23  Enzo Bi, MD  clopidogrel (PLAVIX) 75 MG tablet Take 1 tablet (75 mg total) by mouth daily. 11/23/22 02/21/23  Enzo Bi, MD  escitalopram (LEXAPRO) 5 MG tablet Take 5 mg by mouth at bedtime.     [provider]  guanFACINE (TENEX) 1 MG tablet Take 1 mg by mouth 2 (two) times daily.     [provider]  losartan (COZAAR) 100 MG tablet Take 100 mg by mouth daily.    [provider]  magnesium oxide (MAG-OX) 400 (240 Mg) MG tablet Take 1 tablet by mouth daily. 05/16/21   [provider]  metFORMIN (GLUMETZA) 500 MG (MOD) 24 hr tablet Take 500 mg by mouth daily with breakfast.    [provider]  rosuvastatin (CRESTOR) 40 MG tablet Take 1 tablet (40 mg total) by mouth daily. 11/23/22 02/21/23  Enzo Bi, MD    Dallie Piles ,PharmD Clinical  Pharmacist  11/23/2022  1:05 PM

## 2022-11-23 NOTE — ED Provider Notes (Signed)
[facility] Provider Note   Event Date/Time   First MD Initiated Contact with Patient 11/23/22 1327     (approximate) History  Weakness  HPI Vanessa Young is a 81 y.o. female with past medical history of TIA and recent MI who presents for right-sided weakness, right-sided facial droop, and slurred speech that began at approximately 1205 today.  EMS reported the symptoms have been resolving throughout transport.  Upon arrival patient does still have mild weakness to the right upper extremity and mild facial droop.  Patient is on Eliquis for history of atrial fibrillation ROS: Patient currently denies any vision changes, tinnitus, difficulty speaking, facial droop, sore throat, chest pain, shortness of breath, abdominal pain, nausea/vomiting/diarrhea, dysuria, or numbness/paresthesias in any extremity   Physical Exam  Triage Vital Signs: ED Triage Vitals  Enc Vitals Group     BP 11/23/22 1338 127/64     Pulse Rate 11/23/22 1338 74     Resp 11/23/22 1338 14     Temp 11/23/22 1338 98.3 F (36.8 C)     Temp Source 11/23/22 1338 Oral     SpO2 11/23/22 1338 95 %     Weight 11/23/22 1339 138 lb 3.7 oz (62.7 kg)     Height 11/23/22 1339 4' 9.01" (1.448 m)     Head Circumference --      Peak Flow --      Pain Score 11/23/22 1339 0     Pain Loc --      Pain Edu? --      Excl. in [place]? --    Most recent vital signs: Vitals:   11/24/22 1500 11/24/22 1547  BP: 133/82 (!) 148/77  Pulse: 72 67  Resp: 20 15  Temp:  98.2 F (36.8 C)  SpO2: 96% 100%   General: Awake, oriented x4. CV:  Good peripheral perfusion.  Resp:  Normal effort.  Abd:  No distention.  Other:  Elderly obese Caucasian female lying on stretcher in no acute distress.  Mild right facial droop ED Results / Procedures / Treatments  Labs (all labs ordered are listed, but only abnormal results are displayed) Labs Reviewed  PROTIME-INR - Abnormal; Notable for the following components:      Result  Value   Prothrombin Time 18.3 (*)    INR 1.5 (*)    All other components within normal limits  COMPREHENSIVE METABOLIC PANEL - Abnormal; Notable for the following components:   Glucose, Bld 170 (*)    BUN 36 (*)    Creatinine, Ser 1.46 (*)    Calcium 8.8 (*)    GFR, Estimated 36 (*)    All other components within normal limits  LIPID PANEL - Abnormal; Notable for the following components:   Triglycerides 205 (*)    HDL 34 (*)    VLDL 41 (*)    All other components within normal limits  CBG MONITORING, ED - Abnormal; Notable for the following components:   Glucose-Capillary 166 (*)    All other components within normal limits  CBG MONITORING, ED - Abnormal; Notable for the following components:   Glucose-Capillary 154 (*)    All other components within normal limits  CBG MONITORING, ED - Abnormal; Notable for the following components:   Glucose-Capillary 191 (*)    All other components within normal limits  CBG MONITORING, ED - Abnormal; Notable for the following components:   Glucose-Capillary 145 (*)    All other components within normal limits  APTT  CBC  DIFFERENTIAL  ETHANOL  I-STAT CREATININE, ED   EKG ED ECG REPORT I, Naaman Plummer, the attending physician, personally viewed and interpreted this ECG. Date: 11/23/2022 EKG Time: 1337 Rate: 68 Rhythm: normal sinus rhythm QRS Axis: normal Intervals: normal ST/T Wave abnormalities: normal Narrative Interpretation: no evidence of acute ischemia RADIOLOGY ED MD interpretation: CT of the head without contrast interpreted by me shows no evidence of acute abnormalities including no intracerebral hemorrhage, obvious masses, or significant edema -Agree with radiology assessment Official radiology report(s): No results found. PROCEDURES: Critical Care performed: No Procedures MEDICATIONS ORDERED IN ED: Medications  rosuvastatin (CRESTOR) tablet 40 mg (40 mg Oral Given 11/24/22 0929)  ALPRAZolam Duanne Moron) tablet 0.25 mg  (0.25 mg Oral Given 11/23/22 2158)  clopidogrel (PLAVIX) tablet 75 mg (75 mg Oral Given 11/24/22 0929)  magnesium oxide (MAG-OX) tablet 400 mg (400 mg Oral Given 11/24/22 0929)   stroke: early stages of recovery book (has no administration in time range)  acetaminophen (TYLENOL) tablet 650 mg (has no administration in time range)    Or  acetaminophen (TYLENOL) 160 MG/5ML solution 650 mg (has no administration in time range)    Or  acetaminophen (TYLENOL) suppository 650 mg (has no administration in time range)  senna-docusate (Senokot-S) tablet 1 tablet (has no administration in time range)  escitalopram (LEXAPRO) tablet 10 mg (10 mg Oral Given 11/23/22 2158)  apixaban (ELIQUIS) tablet 5 mg (5 mg Oral Given 11/24/22 1008)  sodium chloride flush (NS) 0.9 % injection 3 mL (3 mLs Intravenous Given 11/23/22 1344)  iohexol (OMNIPAQUE) 350 MG/ML injection 75 mL (75 mLs Intravenous Contrast Given 11/23/22 1315)   IMPRESSION / MDM / ASSESSMENT AND PLAN / ED COURSE  I reviewed the triage vital signs and the nursing notes.                             The patient is on the cardiac monitor to evaluate for evidence of arrhythmia and/or significant heart rate changes. Patient's presentation is most consistent with acute presentation with potential threat to life or bodily function. Patient is a 81 year old female who presents with symptoms concerning for TIA PMH risk factors: History of CVA, MI Neurologic Deficits: Right-sided weakness, dysarthria, right sided facial droop Last known Well Time: 1210 NIH Stroke Score: 2 Given History and Exam I have lower suspicion for infectious etiology, neurologic changes secondary to toxicologic ingestion, seizure, complex migraine. Presentation concerning for possible stroke requiring workup.  Workup: Labs: POC glucose, CBC, BMP, LFTs, Troponin, PT/INR, PTT, Type and Screen Other Diagnostics: ECG, CXR, non-contrast head CT followed by CTA brain and neck (to r/o large  vessel occlusion amenable to thrombectomy)  Consult: hospitalist, neurology Disposition: Pending   FINAL CLINICAL IMPRESSION(S) / ED DIAGNOSES   Final diagnoses:  None   Rx / DC Orders   ED Discharge Orders          Ordered    pantoprazole (PROTONIX) 40 MG tablet  Daily        11/24/22 1506    Increase activity slowly        11/24/22 1631    Diet - low sodium heart healthy        11/24/22 1631           Note:  This document was prepared using Dragon voice recognition software and may include unintentional dictation errors.   Naaman Plummer, MD 11/24/22 [phone]

## 2022-11-23 NOTE — ED Notes (Signed)
Code  stroke  called  to  carelink  per  Dr  Leonel Ramsay  MD

## 2022-11-23 NOTE — Progress Notes (Signed)
   11/23/22 1347  Clinical Encounter Type  Visited With Patient and family together  Visit Type Initial;Code  Referral From [place] responded to Code Stroke and provided spiritual care to both patient and family members present.

## 2022-11-23 NOTE — Consult Note (Signed)
Neurology Consultation Reason for Consult: Right-sided weakness and aphasia Referring Physician: Cheri Fowler, E  CC: Right-sided weakness and aphasia  History is obtained from: Patient  HPI: Vanessa Young is a 81 y.o. female who was in her normal state of health until shortly after noon when she began having right-sided weakness and aphasia.  Apparently on fire's arrival, she still had significant right-sided weakness, but by the time of EMS arrival, she was much improved.  The initially thought she was resolved and therefore did not activate a code stroke, however in route it became clear that she was still having some difficulty with her speech and had some facial droop and therefore code stroke was activated at the bridge.   LKW: 12:05 PM tpa given?: no, anticoagulated    Past Medical History:  Diagnosis Date   A-fib ([place])    Depression    Diabetes mellitus without complication ([place])    GERD (gastroesophageal reflux disease)    Hypercholesteremia    Hypertension      Family History  Problem Relation Age of Onset   CVA Mother    Diabetes Mother    CVA Father      Social History:  reports that she quit smoking about 21 years ago. Her smoking use included cigarettes. She has a 5.00 pack-year smoking history. She has never used smokeless tobacco. She reports that she does not drink alcohol and does not use drugs.   Exam: Current vital signs: There were no vitals taken for this visit. Vital signs in last 24 hours: Resp:  [16] 16 (12/07 1320) BP: (140)/(83) 140/83 (12/07 1320)   Physical Exam  Constitutional: Appears well-developed and well-nourished.  Psych: Affect appropriate to situation Eyes: No scleral injection HENT: No OP obstruction MSK: no joint deformities.  Cardiovascular: Normal rate and regular rhythm.  Respiratory: Effort normal, non-labored breathing GI: Soft.  No distension. There is no tenderness.  Skin: WDI  Neuro: Mental Status: Patient is awake,  alert, oriented to person, place, month, year, and situation. Patient is able to give a clear and coherent history. No signs of neglect She has occasional word finding difficulty. Cranial Nerves: II: She has a very mild right lower field cut(not a full quadrantanopia). Pupils are equal, round, and reactive to light.   III,IV, VI: EOMI without ptosis or diploplia.  V: Facial sensation is symmetric to temperature VII: Facial movement with mild right facial weakness VIII: hearing is intact to voice X: Uvula elevates symmetrically XI: Shoulder shrug is symmetric. XII: tongue is midline without atrophy or fasciculations.  Motor: Tone is normal. Bulk is normal. 5/5 strength was present in all four extremities.  Sensory: Sensation is symmetric to light touch and temperature in the arms and legs. Cerebellar: No clear ataxia     I have reviewed labs in epic and the results pertinent to this consultation are: CBG 166  I have reviewed the images obtained: CT/CTA-negative  Impression: 81 year old female who presents with right-sided weakness and aphasia.  I do suspect that this represents cerebral ischemia.  Her symptoms resolved over the course of my evaluation, therefore suspect this was likely transient ischemic attack.  She is already anticoagulated and has no clear cerebrovascular lesions that would be causative.  In the setting of breakthrough events from atrial fibrillation with anticoagulation, there is no compelling evidence to suggest switching from one anticoagulant to another is indicated.  I do think an MRI would be helpful, but if this is negative then I do not think  admission for TIA workup would be at all likely to change management other than could consider checking a lipid panel and trying to reduce to goal LDL less than 70, but I do not think this merits admission.  If her MRI is positive, then I would hold anticoagulation and she would need evaluation by  PT/OT/ST  Recommendations: 1) MRI brain 2) admission for stroke workup only if positive.   Roland Rack, MD [facility] [phone]  If 7pm- 7am, please page neurology on call as listed in [place].

## 2022-11-23 NOTE — ED Notes (Signed)
Pt ambulated to restroom with steady gait, assist x1.  She was able to void and return back to stretcher.  NAD.

## 2022-11-23 NOTE — Code Documentation (Signed)
Stroke Response Nurse Documentation Code Documentation  Vanessa Young is a 81 y.o. female arriving to [facility] via [facility] on 11/23/2022 with past medical hx of a-fib, HTN, DM, GERD, hypercholesteremia, recent stent placed 12/7, per patient. On clopidogrel 75 mg daily and Eliquis (apixaban) daily. Code stroke was activated by ED.   Patient from home where she was LKW at 1210 and now complaining of right side weakness. Patient was at home where she reports having acute onset right sided weakness, blurry vision, and difficulty speaking.     Stroke response nurse at the bedside on patient arrival. Neurologist called to bedside and Code Stroke was called on arrival based off story obtained by EMS and patient exam. Labs drawn and patient taken for CT. NIHSS 5, see documentation for details and code stroke times. Patient with right hemianopia, right facial droop, Expressive aphasia , and dysarthria  on exam. The following imaging was completed:  CT Head and CTA. Patient is not a candidate for IV Thrombolytic due to recent Eliquis dose taken, per MD. Patient is not a candidate for IR due to no LVO seen on imaging, per MD.  Patient seen by EDP Bradler in [place]. Family and patient updated at bedside.   Care Plan: q2h NIHSS + VS.   Bedside handoff with ED RNs Kelsey&Erika.    Charise Carwin  Stroke Response RN

## 2022-11-23 NOTE — ED Notes (Addendum)
Pt on phone with MRI for screening, providing consent

## 2022-11-23 NOTE — ED Notes (Signed)
Pt to MRI

## 2022-11-23 NOTE — Assessment & Plan Note (Signed)
Resuming Eliquis and Coreg

## 2022-11-23 NOTE — Assessment & Plan Note (Signed)
Hold metformin Keep patient n.p.o. until swallow function has been assessed Check blood sugars every 4 hours while NPO

## 2022-11-23 NOTE — ED Notes (Signed)
Notified pharmacy tech that pt would like to go over her medications to ensure she is able to take eliquis tomorrow.

## 2022-11-24 DIAGNOSIS — N179 Acute kidney failure, unspecified: Secondary | ICD-10-CM

## 2022-11-24 DIAGNOSIS — I480 Paroxysmal atrial fibrillation: Secondary | ICD-10-CM

## 2022-11-24 DIAGNOSIS — E663 Overweight: Secondary | ICD-10-CM | POA: Diagnosis present

## 2022-11-24 DIAGNOSIS — I10 Essential (primary) hypertension: Secondary | ICD-10-CM

## 2022-11-24 DIAGNOSIS — I639 Cerebral infarction, unspecified: Secondary | ICD-10-CM | POA: Diagnosis not present

## 2022-11-24 LAB — LIPID PANEL
Cholesterol: 166 mg/dL (ref 0–200)
HDL: 34 mg/dL — ABNORMAL LOW (ref >40)
LDL Cholesterol: 91 mg/dL (ref 0–99)
Total CHOL/HDL Ratio: 4.9 RATIO
Triglycerides: 205 mg/dL — ABNORMAL HIGH (ref <150)
VLDL: 41 mg/dL — ABNORMAL HIGH (ref 0–40)

## 2022-11-24 LAB — CBG MONITORING, ED: Glucose-Capillary: 145 mg/dL — ABNORMAL HIGH (ref 70–99)

## 2022-11-24 MED ORDER — APIXABAN 5 MG PO TABS
5.0000 mg | ORAL_TABLET | Freq: Two times a day (BID) | ORAL | Status: DC
  Administered 2022-11-24: 5 mg via ORAL
  Filled 2022-11-24: qty 1

## 2022-11-24 MED ORDER — PANTOPRAZOLE SODIUM 40 MG PO TBEC: 40.0000 mg | DELAYED_RELEASE_TABLET | Freq: Every day | ORAL | 1 refills | Status: DC

## 2022-11-24 NOTE — Assessment & Plan Note (Signed)
Meets criteria with BMI greater than 25 

## 2022-11-24 NOTE — Progress Notes (Signed)
Subjective: Symptoms are completely resolved  Exam: Vitals:   11/24/22 0915 11/24/22 0937  BP: 131/75   Pulse: 69   Resp: 18   Temp:  97.8 F (36.6 C)  SpO2: 98%    Gen: In bed, NAD Resp: non-labored breathing, no acute distress Abd: soft, nt  Neuro: MS: Awake, alert, interactive and appropriate, oriented x 3 CN: extraocular movements are intact, visual fields are full, face is symmetric Motor: 5/5 throughout Sensory: Intact light touch  Pertinent Labs: LDL 91   Impression: 81 year old female who presents with transient right-sided weakness and aphasia.  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  In the setting of anticoagulation, there is no real good evidence to suggest that changing from one anticoagulant to another is indicated in the setting of treatment breakthrough.  Though there is some risk of hemorrhagic conversion, I think this risk is exceedingly small in someone with the size infarct that she has, and therefore I would favor restarting her Eliquis this morning.   Recommendations: 1) resume Eliquis 5 mg twice daily 2) no further neurodiagnostic studies needed, could consider echo given recent MI if would cahnge management from cardiac perspective.  3) Neurology will be available as needed.   Roland Rack, MD [facility] [phone]  If 7pm- 7am, please page neurology on call as listed in [place].

## 2022-11-24 NOTE — Progress Notes (Signed)
OT Cancellation Note  Patient Details Name: Vanessa Young MRN: [id number] DOB: 1941-08-24   Cancelled Treatment:    Reason Eval/Treat Not Completed: OT screened, no needs identified, will sign off. OT orders received, chart reviewed. Pt reports all symptoms have resolved. She has been ambulating to the bathroom with RN. Pt denies any ADL concerns. Appears to be back to baseline, pt agrees. No skilled OT needs indicated at this time. Please re-consult if there are any acute changes.   Doneta Public 11/24/2022, 12:41 PM

## 2022-11-24 NOTE — ED Notes (Signed)
Pt assisted to restroom.  

## 2022-11-24 NOTE — Progress Notes (Signed)
SLP Cancellation Note  Patient Details Name: Vanessa Young MRN: [id number] DOB: [birth year]   Cancelled treatment:        Chart reviewed and Pt visited. Speech was appropraite and clear. Pt reports all deficits resolved. No ST needs at this time. Please reconsult if needs arise.   Lucila Maine 11/24/2022, 1:13 PM

## 2022-11-24 NOTE — Evaluation (Signed)
Physical Therapy Evaluation Patient Details Name: Vanessa Young MRN: [id number] DOB: 14-Jan-1941 Today's Date: 11/24/2022  History of Present Illness  81 year old female who presents with transient right-sided weakness and aphasia.  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  In the setting of anticoagulation, there is no real good evidence to suggest that changing from one anticoagulant to another is indicated in the setting of treatment breakthrough.  Clinical Impression  The pt presents this session is great spirits. She is willing to complete a balance and gait assessment with PT. The pt scores 15/30 on the Functional Gait Assessment which is indicative of poorer stability and balance, and indicates that the patient is at an increased risk of falls. At this time the pt is expected to be able to safely d/c home with family care and initiation of outpatient PT in order to address functional stability and balance deficits. PT will continue to follow while inpatient.       Recommendations for follow up therapy are one component of a multi-disciplinary discharge planning process, led by the attending physician.  Recommendations may be updated based on patient status, additional functional criteria and insurance authorization.  Follow Up Recommendations Outpatient PT      Assistance Recommended at Discharge PRN  Patient can return home with the following  Assist for transportation    Equipment Recommendations    Recommendations for Other Services       Functional Status Assessment Patient has had a recent decline in their functional status and demonstrates the ability to make significant improvements in function in a reasonable and predictable amount of time.     Precautions / Restrictions Precautions Precautions: Fall Restrictions Weight Bearing Restrictions: No      Mobility  Bed Mobility Overal bed mobility: Modified Independent                  Transfers Overall  transfer level: Needs assistance Equipment used: None Transfers: Sit to/from Stand Sit to Stand: Modified independent (Device/Increase time)                Ambulation/Gait Ambulation/Gait assistance: Min guard Gait Distance (Feet): 200 Feet Assistive device: None            Stairs            Wheelchair Mobility    Modified Rankin (Stroke Patients Only)       Balance Overall balance assessment: Needs assistance   Sitting balance-Leahy Scale: Good       Standing balance-Leahy Scale: Fair                   Standardized Balance Assessment Standardized Balance Assessment :  (FGA: 15/30 PASS:33/36)           Pertinent Vitals/Pain Pain Assessment Pain Assessment: No/denies pain    Home Living Family/patient expects to be discharged to:: Private residence Living Arrangements: Alone Available Help at Discharge: Family;Available PRN/intermittently Type of Home: House Home Access: Level entry       Home Layout: One level Home Equipment: Shower seat      Prior Function Prior Level of Function : Independent/Modified Independent;Driving                     Hand Dominance   Dominant Hand: Left    Extremity/Trunk Assessment   Upper Extremity Assessment Upper Extremity Assessment: Overall WFL for tasks assessed    Lower Extremity Assessment Lower Extremity Assessment: Overall WFL for tasks assessed  Communication   Communication: No difficulties  Cognition Arousal/Alertness: Awake/alert Behavior During Therapy: WFL for tasks assessed/performed Overall Cognitive Status: Within Functional Limits for tasks assessed                                          General Comments      Exercises     Assessment/Plan    PT Assessment Patient needs continued PT services  PT Problem List Decreased activity tolerance;Decreased balance;Decreased mobility       PT Treatment Interventions Gait  training;Functional mobility training;Therapeutic activities;Therapeutic exercise;Balance training    PT Goals (Current goals can be found in the Care Plan section)  Acute Rehab PT Goals Patient Stated Goal: to go home and start outpatient PT PT Goal Formulation: With patient Time For Goal Achievement: 12/06/22 Potential to Achieve Goals: Good    Frequency Min 2X/week     Co-evaluation               AM-PAC PT "6 Clicks" Mobility  Outcome Measure Help needed turning from your back to your side while in a flat bed without using bedrails?: None Help needed moving from lying on your back to sitting on the side of a flat bed without using bedrails?: None Help needed moving to and from a bed to a chair (including a wheelchair)?: None Help needed standing up from a chair using your arms (e.g., wheelchair or bedside chair)?: None Help needed to walk in hospital room?: A Little Help needed climbing 3-5 steps with a railing? : A Little 6 Click Score: 22    End of Session Equipment Utilized During Treatment: Gait belt Activity Tolerance: Patient tolerated treatment well Patient left: in bed;with bed alarm set;with call bell/phone within reach Nurse Communication: Mobility status PT Visit Diagnosis: Muscle weakness (generalized) (M62.81);Unsteadiness on feet (R26.81)    Time: 7829-5621 PT Time Calculation (min) (ACUTE ONLY): 34 min   Charges:   PT Evaluation $PT Eval Low Complexity: 1 Low PT Treatments $Gait Training: 23-37 mins        12:44 PM, 11/24/22 [place] [facility] Saverio Danker PT, DPT Physical Therapist - [facility]   Marai Teehan A Taiki Buckwalter 11/24/2022, 12:40 PM

## 2022-11-24 NOTE — Hospital Course (Signed)
81 year old female with past medical history of depression, paroxysmal atrial fibrillation on Eliquis, diabetes mellitus, CAD status post recent STEMI with stent and angioplasty who presented to the emergency room on 12/8 right-sided weakness, dysarthria and blurry vision before.  Not a candidate for thrombolytics as patient on Eliquis.  Prior brain noted 3 small acute infarction posterior right frontal lobe, right parietal lobe and left frontal lobe.  Neurology consulted patient admitted to hospital service for multiple acute CVA workup.

## 2022-11-24 NOTE — Progress Notes (Signed)
Admission and assessment not completed due to patient being discharged shortly after arriving to floor.

## 2022-11-24 NOTE — Discharge Summary (Signed)
Physician Discharge Summary   Patient: Vanessa Young MRN: [id number] DOB: 1941-02-17  Admit date:     11/23/2022  Discharge date: 11/24/22  Discharge Physician: Annita Brod   PCP: Myrtie Soman, MD   Recommendations at discharge:   Referred to Outpatient Physical Therapy Medication change: Prilosec changed to Protonix (Plavix interaction)  Discharge Diagnoses: Principal Problem:   Acute CVA (cerebrovascular accident) [facility]) Active Problems:   PAF (paroxysmal atrial fibrillation) ([place])   AKI (acute kidney injury) ([place])   Primary hypertension   Diabetes mellitus without complication ([place])   Generalized anxiety disorder   GERD (gastroesophageal reflux disease)   Overweight (BMI 25.0-29.9)  Resolved Problems:   * No resolved hospital problems. *  Hospital Course: 81 year old female with past medical history of depression, paroxysmal atrial fibrillation on Eliquis, diabetes mellitus, CAD status post recent STEMI with stent and angioplasty who presented to the emergency room on 12/8 right-sided weakness, dysarthria and blurry vision before.  Not a candidate for thrombolytics as patient on Eliquis.  Prior brain noted 3 small acute infarction posterior right frontal lobe, right parietal lobe and left frontal lobe.  Neurology consulted patient admitted to hospital service for multiple acute CVA workup.  Assessment and Plan: * Acute CVA (cerebrovascular accident) ([place]) Probable embolic although patient already on Eliquis.  CT angiogram negative for large vessel occlusion.  MRI noted multiple small emboli infarcts. Patient presented to the ER for evaluation of sudden onset left-sided weakness associated with expressive aphasia and dysarthria all of which have resolved.  Cleared by speech therapy.  Seen by physical therapy who recommended outpatient PT which we will set up.  Patient seen by neurology with recommendation to continue Eliquis as well as recently started Plavix (Plavix started  just a few days ago.)  In addition, patient has been on Prilosec which can interfere with Plavix so we will change Prilosec to Protonix.  Patient recently had statin dose increased, although she had previously been noncompliant with dose.  Recommended to continue.  Patient had a recent 2D echocardiogram which showed an LVEF of 60 to 65% and evidence of grade 1 diastolic dysfunction.  Mild hypokinesis of left ventricle,, basal mid inferior wall and inferolateral wall.    PAF (paroxysmal atrial fibrillation) (HCC) Resuming Eliquis and Coreg  AKI (acute kidney injury) ([place]) Unclear etiology Patient has a baseline serum creatinine of 0.98 and today on admission it is 1.46 Will hold losartan.  Recheck before d/c  Primary hypertension Will allow for permissive hypertension Hold amlodipine, carvedilol and losartan  Diabetes mellitus without complication (HCC) Hold metformin Keep patient n.p.o. until swallow function has been assessed Check blood sugars every 4 hours while NPO  Generalized anxiety disorder Continue as needed Xanax  GERD (gastroesophageal reflux disease) Stable Continue PPI  Overweight (BMI 25.0-29.9) Meets criteria with BMI greater than 25         Consultants: Neurology Procedures performed: None Disposition: Home with referral for outpatient physical therapy Diet recommendation:  Discharge Diet Orders (From admission, onward)     Start     Ordered   11/24/22 0000  Diet - low sodium heart healthy        11/24/22 1631           Carb modified/heart healthy DISCHARGE MEDICATION: Allergies as of 11/24/2022       Reactions   Keflex [cephalexin] Hives   Hydrochlorothiazide W-triamterene Nausea Only   Lisinopril Other (See Comments)   Unknown    Penicillins Hives   Statins  Other (See Comments)   Unknown         Medication List     STOP taking these medications    allopurinol 100 MG tablet Commonly known as: ZYLOPRIM   omeprazole 20 MG  capsule Commonly known as: PRILOSEC       TAKE these medications    Accu-Chek Guide test strip Generic drug: glucose blood   ALPRAZolam 0.25 MG tablet Commonly known as: XANAX Take 0.25 mg by mouth at bedtime as needed for sleep.   amLODipine 5 MG tablet Commonly known as: NORVASC Take 1 tablet (5 mg total) by mouth daily.   apixaban 5 MG Tabs tablet Commonly known as: ELIQUIS Take 1 tablet (5 mg total) by mouth 2 (two) times daily.   carvedilol 25 MG tablet Commonly known as: COREG Take 1 tablet (25 mg total) by mouth 2 (two) times daily with a meal.   clopidogrel 75 MG tablet Commonly known as: PLAVIX Take 1 tablet (75 mg total) by mouth daily.   escitalopram 10 MG tablet Commonly known as: LEXAPRO Take 10 mg by mouth daily.   guanFACINE 1 MG tablet Commonly known as: TENEX Take 1 mg by mouth 2 (two) times daily.   losartan 100 MG tablet Commonly known as: COZAAR Take 100 mg by mouth daily.   magnesium oxide 400 (240 Mg) MG tablet Commonly known as: MAG-OX Take 1 tablet by mouth daily.   metFORMIN 500 MG (MOD) 24 hr tablet Commonly known as: GLUMETZA Take 500 mg by mouth 2 (two) times daily with a meal.   pantoprazole 40 MG tablet Commonly known as: Protonix Take 1 tablet (40 mg total) by mouth daily.   rosuvastatin 40 MG tablet Commonly known as: CRESTOR Take 1 tablet (40 mg total) by mouth daily.        Discharge Exam: Filed Weights   11/23/22 1339  Weight: 62.7 kg   General: Alert and oriented x 3, no acute distress No focal deficits  Condition at discharge: good  The results of significant diagnostics from this hospitalization (including imaging, microbiology, ancillary and laboratory) are listed below for reference.   Imaging Studies: MR Brain Wo Contrast (neuro protocol)  Result Date: 11/23/2022 CLINICAL DATA:  Neuro deficit EXAM: MRI HEAD WITHOUT CONTRAST TECHNIQUE: Multiplanar, multiecho pulse sequences of the brain and  surrounding structures were obtained without intravenous contrast. COMPARISON:  Same-day CT brain. FINDINGS: Brain: There are a few scattered foci of acute infarcts in the posterior right frontal lobe, right parietal lobe, in the left frontal lobe (series 5, image 25, 33). Sequela of severe chronic microvascular ischemic change which also extends to involve the brainstem. No hemorrhage, hydrocephalus, extra-axial collection or mass lesion. Advanced generalized volume loss. Vascular: The basilar artery is tortuous causes mass effect on the ventral pons (series 16, image 53). Skull and upper cervical spine: Normal marrow signal. Sinuses/Orbits: Trace right mastoid effusion. Bilateral orbits are unremarkable. Mild mucosal thickening right sphenoid sinus. Other: None IMPRESSION: 1. There are 3 small foci of acute infarcts in the posterior right frontal lobe, right parietal lobe and left frontal lobe. Given distribution, these are favored to be secondary to a central embolic source. 2. Severe chronic microvascular ischemic changes of the white matter. Electronically Signed   By: Marin Roberts M.D.   On: 11/23/2022 16:09   CT ANGIO HEAD NECK W WO CM (CODE STROKE)  Result Date: 11/23/2022 CLINICAL DATA:  Stroke suspected EXAM: CT ANGIOGRAPHY HEAD AND NECK TECHNIQUE: Multidetector CT imaging of the  head and neck was performed using the standard protocol during bolus administration of intravenous contrast. Multiplanar CT image reconstructions and MIPs were obtained to evaluate the vascular anatomy. Carotid stenosis measurements (when applicable) are obtained utilizing NASCET criteria, using the distal internal carotid diameter as the denominator. RADIATION DOSE REDUCTION: This exam was performed according to the departmental dose-optimization program which includes automated exposure control, adjustment of the mA and/or kV according to patient size and/or use of iterative reconstruction technique. CONTRAST:  31m  OMNIPAQUE IOHEXOL 350 MG/ML SOLN COMPARISON:  No prior CTA, correlation is made with CT head 11/23/2022 FINDINGS: CT HEAD FINDINGS For noncontrast findings, please see same day CT head. CTA NECK FINDINGS Aortic arch: Standard branching. Imaged portion shows no evidence of aneurysm or dissection. No significant stenosis of the major arch vessel origins. Aortic atherosclerosis. Right carotid system: 50% stenosis of the proximal right ICA. Focal narrowing of the proximal right CCA is favored to be related to tortuosity. No evidence of dissection or occlusion. Left carotid system: No evidence of dissection, occlusion, or hemodynamically significant stenosis (greater than 50%). Atherosclerotic disease at the bifurcation and in the proximal ICA is not hemodynamically significant. Vertebral arteries: Mild narrowing of the proximal left V 1, which may be secondary to tortuosity and kinking. The left vertebral artery is dominant and otherwise patent to the skull base. The right vertebral artery is patent, without significant stenosis, dissection, or occlusion. Skeleton: No acute osseous abnormality. Degenerative changes in the cervical spine. Other neck: 4 mm hypoenhancing focus in the left thyroid lobe, for which no follow-up is currently indicated. (Reference: J Am Coll Radiol. 2015 Feb;12(2): 143-50) Upper chest: No focal pulmonary opacity or pleural effusion. Review of the MIP images confirms the above findings CTA HEAD FINDINGS Anterior circulation: Both internal carotid arteries are patent to the termini, with moderate stenosis in the right cavernous segment and mild stenosis in the right supraclinoid and left cavernous segments. Patent left A1. Hypoplastic or aplastic right A1. Normal anterior communicating artery. Anterior cerebral arteries are patent to their distal aspects. No M1 stenosis or occlusion. MCA branches perfused and symmetric. Posterior circulation: The right V4 primarily terminates in PICA, with a  diminutive continuation of the right V4. Vertebral arteries patent to the vertebrobasilar junction without stenosis. Posterior inferior cerebellar arteries patent proximally. Basilar patent to its distal aspect. Superior cerebellar arteries patent proximally. Patent P1 segments. Mild stenosis in the right P1 and proximal P2 segment (series 4, image 103) and moderate stenosis in the more distal right P2 (series 7, image 229). PCAs otherwise perfused to their distal aspects without stenosis. The bilateral posterior communicating arteries are not visualized. Venous sinuses: Limited by bolus timing. Anatomic variants: None significant. Review of the MIP images confirms the above findings IMPRESSION: 1. No intracranial large vessel occlusion. Moderate stenosis in the right cavernous ICA and right P 2 segment. 2. 50% stenosis of the proximal right ICA. No other hemodynamically significant stenosis in the neck. 3. Aortic atherosclerosis. Aortic Atherosclerosis (ICD10-I70.0). Code stroke imaging results were communicated on 11/23/2022 at 1:42 pm to provider Dr. KLeonel Ramsayvia secure text paging. Electronically Signed   By: AMerilyn BabaM.D.   On: 11/23/2022 13:42   CT HEAD CODE STROKE WO CONTRAST  Result Date: 11/23/2022 CLINICAL DATA:  Code stroke.  Acute stroke presentation EXAM: CT HEAD WITHOUT CONTRAST TECHNIQUE: Contiguous axial images were obtained from the base of the skull through the vertex without intravenous contrast. RADIATION DOSE REDUCTION: This exam was performed according to  the departmental dose-optimization program which includes automated exposure control, adjustment of the mA and/or kV according to patient size and/or use of iterative reconstruction technique. COMPARISON:  03/08/2022 FINDINGS: Brain: Advanced widespread chronic small-vessel ischemic change of the cerebral hemispheric white matter. 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 No sign of acute infarction, mass lesion, hemorrhage,  hydrocephalus or extra-axial collection. Some ventriculomegaly related to central atrophy. Vascular: There is atherosclerotic calcification of the major vessels at the base of the brain. Skull: Negative Sinuses/Orbits: Clear/normal Other: None ASPECTS ([place] Stroke Program Early CT Score) - Ganglionic level infarction (caudate, lentiform nuclei, internal capsule, insula, M1-M3 cortex): 7 - Supraganglionic infarction (M4-M6 cortex): 3 Total score (0-10 with 10 being normal): 10 IMPRESSION: 1. No acute CT finding. Advanced chronic small-vessel ischemic changes of the cerebral hemispheric white matter. 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 2. Aspects is 10. These results were communicated to Dr. Leonel Ramsay at 1:15 pm on 11/23/2022 by text page via the [facility] messaging system. Electronically Signed   By: Nelson Chimes M.D.   On: 11/23/2022 13:17   ECHOCARDIOGRAM COMPLETE  Result Date: 11/20/2022    ECHOCARDIOGRAM REPORT   Patient Name:   ALEC MCPHEE Date of Exam: 11/20/2022 Medical Rec #:  [id number]   Height:       57.0 in Accession #:    [id number]  Weight:       139.6 lb Date of Birth:  August 07, 1941    BSA:          1.544 m Patient Age:    24 years    BP:           137/86 mmHg Patient Gender: F           HR:           86 bpm. Exam Location:  [facility] Procedure: 2D Echo, Color Doppler, Cardiac Doppler and Intracardiac            Opacification Agent Indications:     I21.9 Acute myocardial infarction, unspecified  History:         Patient has prior history of Echocardiogram examinations, most                  recent 11/10/2018. Risk Factors:Hypertension, Diabetes and HCL.  Sonographer:     Charmayne Sheer Referring Phys:  [id number] ZJIRCVEL A ARIDA Diagnosing Phys: Nelva Bush MD  Sonographer Comments: Suboptimal apical window and suboptimal subcostal window. IMPRESSIONS  1. Left ventricular ejection fraction, by estimation, is 60 to 65%. The left ventricle has normal function. The left ventricle demonstrates regional wall  motion abnormalities (see scoring diagram/findings for description). There is mild left ventricular  hypertrophy. Left ventricular diastolic parameters are consistent with Grade I diastolic dysfunction (impaired relaxation). Elevated left atrial pressure. There is mild hypokinesis of the left ventricular, basal-mid inferior wall and inferolateral wall.  2. Right ventricular systolic function is normal. The right ventricular size is normal. Tricuspid regurgitation signal is inadequate for assessing PA pressure.  3. The mitral valve is normal in structure. No evidence of mitral valve regurgitation. No evidence of mitral stenosis.  4. The aortic valve is tricuspid. Aortic valve regurgitation is trivial. No aortic stenosis is present. FINDINGS  Left Ventricle: Left ventricular ejection fraction, by estimation, is 60 to 65%. The left ventricle has normal function. The left ventricle demonstrates regional wall motion abnormalities. Mild hypokinesis of the left ventricular, basal-mid inferior wall and inferolateral wall. Definity contrast agent was given IV to delineate the  left ventricular endocardial borders. The left ventricular internal cavity size was normal in size. There is mild left ventricular hypertrophy. Left ventricular diastolic parameters are consistent with Grade I diastolic dysfunction (impaired relaxation). Elevated left atrial pressure. Right Ventricle: The right ventricular size is normal. No increase in right ventricular wall thickness. Right ventricular systolic function is normal. Tricuspid regurgitation signal is inadequate for assessing PA pressure. Left Atrium: Left atrial size was normal in size. Right Atrium: Right atrial size was not well visualized. Pericardium: There is no evidence of pericardial effusion. Mitral Valve: The mitral valve is normal in structure. No evidence of mitral valve regurgitation. No evidence of mitral valve stenosis. Tricuspid Valve: The tricuspid valve is normal in  structure. Tricuspid valve regurgitation is trivial. Aortic Valve: The aortic valve is tricuspid. Aortic valve regurgitation is trivial. No aortic stenosis is present. Aortic valve mean gradient measures 3.0 mmHg. Aortic valve peak gradient measures 5.5 mmHg. Aortic valve area, by VTI measures 2.14 cm. Pulmonic Valve: The pulmonic valve was grossly normal. Pulmonic valve regurgitation is not visualized. No evidence of pulmonic stenosis. Aorta: The aortic root and ascending aorta are structurally normal, with no evidence of dilitation. Pulmonary Artery: The pulmonary artery is of normal size. Venous: The inferior vena cava was not well visualized. IAS/Shunts: The interatrial septum was not well visualized.  LEFT VENTRICLE PLAX 2D LVIDd:         3.10 cm     Diastology LVIDs:         2.70 cm     LV e' medial:    4.24 cm/s LV PW:         1.10 cm     LV E/e' medial:  15.4 LV IVS:        1.00 cm     LV e' lateral:   4.03 cm/s LVOT diam:     2.00 cm     LV E/e' lateral: 16.3 LV SV:         41 LV SV Index:   27 LVOT Area:     3.14 cm  LV Volumes (MOD) LV vol d, MOD A2C: 22.7 ml LV vol d, MOD A4C: 41.8 ml LV vol s, MOD A2C: 9.0 ml LV vol s, MOD A4C: 16.9 ml LV SV MOD A2C:     13.7 ml LV SV MOD A4C:     41.8 ml LV SV MOD BP:      20.0 ml RIGHT VENTRICLE RV S prime:     14.70 cm/s LEFT ATRIUM           Index LA diam:      2.50 cm 1.62 cm/m LA Vol (A4C): 27.1 ml 17.56 ml/m  AORTIC VALVE                    PULMONIC VALVE AV Area (Vmax):    2.04 cm     PV Vmax:       0.71 m/s AV Area (Vmean):   2.03 cm     PV Vmean:      54.000 cm/s AV Area (VTI):     2.14 cm     PV VTI:        0.125 m AV Vmax:           117.00 cm/s  PV Peak grad:  2.0 mmHg AV Vmean:          80.300 cm/s  PV Mean grad:  1.0 mmHg AV VTI:  0.192 m AV Peak Grad:      5.5 mmHg AV Mean Grad:      3.0 mmHg LVOT Vmax:         75.80 cm/s LVOT Vmean:        52.000 cm/s LVOT VTI:          0.131 m LVOT/AV VTI ratio: 0.68  AORTA Ao Root diam: 3.20 cm MITRAL  VALVE MV Area (PHT): 5.31 cm     SHUNTS MV Decel Time: 143 msec     Systemic VTI:  0.13 m MV E velocity: 65.50 cm/s   Systemic Diam: 2.00 cm MV A velocity: 102.00 cm/s MV E/A ratio:  0.64 Harrell Gave End MD Electronically signed by Nelva Bush MD Signature Date/Time: 11/20/2022/6:11:47 PM    Final    CARDIAC CATHETERIZATION  Addendum Date: 11/19/2022     1st Mrg lesion is 100% stenosed.   Prox LAD to Mid LAD lesion is 40% stenosed.   1st Diag lesion is 60% stenosed.   Mid LAD to Dist LAD lesion is 99% stenosed.   RPDA lesion is 85% stenosed.   Prox RCA to Mid RCA lesion is 40% stenosed.   Mid RCA to Dist RCA lesion is 99% stenosed.   A drug-eluting stent was successfully placed using a STENT ONYX FRONTIER 3.5X30.   Post intervention, there is a 0% residual stenosis.   There is moderate left ventricular systolic dysfunction.   LV end diastolic pressure is severely elevated.   The left ventricular ejection fraction is 35-45% by visual estimate. 1.  Severe three-vessel coronary artery disease.  The culprit is a subtotal occlusion of the mid right coronary artery.  In addition, the patient has occluded OM1 with collaterals and diffuse subocclusive disease throughout the mid and distal LAD. 2.  Moderately reduced LV systolic function with severely elevated left ventricular end-diastolic pressure 35 mmHg. 3.  Successful angioplasty and drug-eluting stent placement to the mid right coronary artery. 4.  Door to device was delayed by difficult access via the right radial artery given calcified, tortuous and stenosed proximal radial artery.  This was navigated successfully with a run-through wire using balloon assisted tracking. Recommendations: Will treat with aspirin and ticagrelor for now. The patient is chronically anticoagulated for atrial fibrillation.  Anticoagulation can likely be resumed tomorrow if no bleeding issues but should consider switching Xarelto to Eliquis and stopping aspirin to minimize the risk  of bleeding considering her age. I do not think the LAD is optimal for PCI given long diffuse disease.  Favor medical therapy.  Result Date: 11/19/2022   1st Mrg lesion is 100% stenosed.   Prox LAD to Mid LAD lesion is 40% stenosed.   1st Diag lesion is 60% stenosed.   Mid LAD to Dist LAD lesion is 99% stenosed.   RPDA lesion is 85% stenosed.   Prox RCA to Mid RCA lesion is 40% stenosed.   Mid RCA to Dist RCA lesion is 99% stenosed.   A drug-eluting stent was successfully placed using a STENT ONYX FRONTIER 3.5X30.   Post intervention, there is a 0% residual stenosis.   There is moderate left ventricular systolic dysfunction.   LV end diastolic pressure is severely elevated.   The left ventricular ejection fraction is 35-45% by visual estimate. 1.  Severe three-vessel coronary artery disease.  The culprit is a subtotal occlusion of the mid right coronary artery.  In addition, the patient has occluded OM1 with collaterals and diffuse subocclusive disease throughout the mid  and distal LAD. 2.  Moderately reduced LV systolic function with severely elevated left ventricular end-diastolic pressure 35 mmHg. 3.  Successful angioplasty and drug-eluting stent placement to the mid right coronary artery. 4.  Dooe to device was delayed by difficult access via the right radial artery given calcified, tortuous and stenosed proximal radial artery.  This was navigated successfully with a run-through wire using balloon assisted tracking. Recommendations: Will treat with aspirin and ticagrelor for now. The patient is chronically anticoagulated for atrial fibrillation.  Anticoagulation can likely be resumed tomorrow if no bleeding issues but should consider switching Xarelto to Eliquis and stopping aspirin to minimize the risk of bleeding considering her age. I do not think the LAD is optimal for PCI given long diffuse disease.  Favor medical therapy.    Microbiology: Results for orders placed or performed during the hospital  encounter of 11/19/22  MRSA Next Gen by PCR, Nasal     Status: None   Collection Time: 11/19/22  4:36 PM   Specimen: Nasal Mucosa; Nasal Swab  Result Value Ref Range Status   MRSA by PCR Next Gen NOT DETECTED NOT DETECTED Final    Comment: (NOTE) The GeneXpert MRSA Assay (FDA approved for NASAL specimens only), is one component of a comprehensive MRSA colonization surveillance program. It is not intended to diagnose MRSA infection nor to guide or monitor treatment for MRSA infections. Test performance is not FDA approved in patients less than 29 years old. Performed at [facility], [address] [place], [place] [address]     Labs: CBC: Recent Labs  Lab 11/18/22 1006 11/19/22 1446 11/20/22 0511 11/21/22 0459 11/22/22 0438 11/23/22 1307  WBC 11.0* 9.1 6.0 8.0 7.8 7.4  NEUTROABS 9.0* 6.2  --   --   --  5.4  HGB 16.2* 14.6 11.8* 13.8 14.3 13.2  HCT 49.3* 45.3 37.0 42.7 43.7 40.5  MCV 88.7 91.0 90.0 90.3 89.4 90.0  PLT 273 270 215 232 257 696   Basic Metabolic Panel: Recent Labs  Lab 11/19/22 1446 11/20/22 0511 11/21/22 0459 11/22/22 0438 11/23/22 1307  NA 139 142 141 142 140  K 3.9 4.1 3.6 3.7 4.3  CL 103 106 107 108 107  CO2 '28 30 27 25 24  '$ GLUCOSE 196* 131* 150* 151* 170*  BUN '19 15 17 22 '$ 36*  CREATININE 0.88 0.96 1.05* 0.98 1.46*  CALCIUM 9.3 8.8* 8.6* 9.0 8.8*  MG  --   --   --  2.0  --    Liver Function Tests: Recent Labs  Lab 11/19/22 1446 11/20/22 0511 11/23/22 1307  AST 24 35 27  ALT '14 15 17  '$ ALKPHOS 78 54 66  BILITOT 0.6 0.7 0.6  PROT 7.4 5.9* 6.8  ALBUMIN 3.9 3.0* 3.5   CBG: Recent Labs  Lab 11/22/22 1132 11/23/22 1301 11/23/22 1944 11/23/22 2343 11/24/22 0356  GLUCAP 128* 166* 154* 191* 145*    Discharge time spent: greater than 30 minutes.  Signed: Annita Brod, MD [facility] 11/24/2022

## 2022-11-28 ENCOUNTER — Encounter: Payer: Medicare PPO | Attending: Cardiovascular Disease | Admitting: *Deleted

## 2022-11-28 DIAGNOSIS — I213 ST elevation (STEMI) myocardial infarction of unspecified site: Secondary | ICD-10-CM | POA: Insufficient documentation

## 2022-11-28 DIAGNOSIS — Z48812 Encounter for surgical aftercare following surgery on the circulatory system: Secondary | ICD-10-CM | POA: Insufficient documentation

## 2022-11-28 DIAGNOSIS — Z955 Presence of coronary angioplasty implant and graft: Secondary | ICD-10-CM | POA: Insufficient documentation

## 2022-11-28 NOTE — Progress Notes (Signed)
Initial phone call completed. Diagnosis can be found in [facility] 12/4. EP Orientation scheduled for Wednesday 12/27 at 1:30.

## 2022-12-12 VITALS — Ht 58.5 in | Wt 136.8 lb

## 2022-12-12 DIAGNOSIS — Z955 Presence of coronary angioplasty implant and graft: Secondary | ICD-10-CM | POA: Diagnosis not present

## 2022-12-12 DIAGNOSIS — I213 ST elevation (STEMI) myocardial infarction of unspecified site: Secondary | ICD-10-CM

## 2022-12-12 DIAGNOSIS — Z48812 Encounter for surgical aftercare following surgery on the circulatory system: Secondary | ICD-10-CM | POA: Diagnosis not present

## 2022-12-12 NOTE — Patient Instructions (Signed)
Patient Instructions  Patient Details  Name: Vanessa Young MRN: [id number] Date of Birth: 04-Feb-1941 Referring Provider:  Wellington Hampshire, MD  Below are your personal goals for exercise, nutrition, and risk factors. Our goal is to help you stay on track towards obtaining and maintaining these goals. We will be discussing your progress on these goals with you throughout the program.  Initial Exercise Prescription:  Initial Exercise Prescription - 12/12/22 1500       Date of Initial Exercise RX and Referring Provider   Date 12/12/22    Referring Provider Kathlyn Sacramento MD      Oxygen   Maintain Oxygen Saturation 88% or higher      Treadmill   MPH 1.6    Grade 0    Minutes 15    METs 2.23      NuStep   Level 2    SPM 80    Minutes 1.39    METs 1.39      Biostep-RELP   Level 1    SPM 50    Minutes 15    METs 1.39      Track   Laps 18    Minutes 15    METs 1.98      Prescription Details   Frequency (times per week) 3    Duration Progress to 30 minutes of continuous aerobic without signs/symptoms of physical distress      Intensity   THRR 40-80% of Max Heartrate 88-121    Ratings of Perceived Exertion 11-13    Perceived Dyspnea 0-4      Progression   Progression Continue to progress workloads to maintain intensity without signs/symptoms of physical distress.      Resistance Training   Training Prescription Yes    Weight 2 lb    Reps 10-15             Exercise Goals: Frequency: Be able to perform aerobic exercise two to three times per week in program working toward 2-5 days per week of home exercise.  Intensity: Work with a perceived exertion of 11 (fairly light) - 15 (hard) while following your exercise prescription.  We will make changes to your prescription with you as you progress through the program.   Duration: Be able to do 30 to 45 minutes of continuous aerobic exercise in addition to a 5 minute warm-up and a 5 minute cool-down routine.    Nutrition Goals: Your personal nutrition goals will be established when you do your nutrition analysis with the dietician.  The following are general nutrition guidelines to follow: Cholesterol < '200mg'$ /day Sodium < '1500mg'$ /day Fiber: Women over 50 yrs - 21 grams per day  Personal Goals:  Personal Goals and Risk Factors at Admission - 12/12/22 1459       Core Components/Risk Factors/Patient Goals on Admission   Diabetes Yes    Intervention Provide education about signs/symptoms and action to take for hypo/hyperglycemia.;Provide education about proper nutrition, including hydration, and aerobic/resistive exercise prescription along with prescribed medications to achieve blood glucose in normal ranges: Fasting glucose 65-99 mg/dL    Expected Outcomes Short Term: Participant verbalizes understanding of the signs/symptoms and immediate care of hyper/hypoglycemia, proper foot care and importance of medication, aerobic/resistive exercise and nutrition plan for blood glucose control.;Long Term: Attainment of HbA1C < 7%.    Hypertension Yes    Intervention Provide education on lifestyle modifcations including regular physical activity/exercise, weight management, moderate sodium restriction and increased consumption of fresh fruit, vegetables, and low  fat dairy, alcohol moderation, and smoking cessation.;Monitor prescription use compliance.    Expected Outcomes Short Term: Continued assessment and intervention until BP is < 140/43m HG in hypertensive participants. < 130/814mHG in hypertensive participants with diabetes, heart failure or chronic kidney disease.;Long Term: Maintenance of blood pressure at goal levels.    Lipids Yes    Intervention Provide education and support for participant on nutrition & aerobic/resistive exercise along with prescribed medications to achieve LDL '70mg'$ , HDL >'40mg'$ .    Expected Outcomes Short Term: Participant states understanding of desired cholesterol values and is  compliant with medications prescribed. Participant is following exercise prescription and nutrition guidelines.;Long Term: Cholesterol controlled with medications as prescribed, with individualized exercise RX and with personalized nutrition plan. Value goals: LDL < '70mg'$ , HDL > 40 mg.             Exercise Goals and Review:  Exercise Goals     Row Name 12/12/22 1500             Exercise Goals   Increase Physical Activity Yes       Intervention Provide advice, education, support and counseling about physical activity/exercise needs.;Develop an individualized exercise prescription for aerobic and resistive training based on initial evaluation findings, risk stratification, comorbidities and participant's personal goals.       Expected Outcomes Short Term: Attend rehab on a regular basis to increase amount of physical activity.;Long Term: Add in home exercise to make exercise part of routine and to increase amount of physical activity.;Long Term: Exercising regularly at least 3-5 days a week.       Increase Strength and Stamina Yes       Intervention Provide advice, education, support and counseling about physical activity/exercise needs.;Develop an individualized exercise prescription for aerobic and resistive training based on initial evaluation findings, risk stratification, comorbidities and participant's personal goals.       Expected Outcomes Short Term: Increase workloads from initial exercise prescription for resistance, speed, and METs.;Short Term: Perform resistance training exercises routinely during rehab and add in resistance training at home;Long Term: Improve cardiorespiratory fitness, muscular endurance and strength as measured by increased METs and functional capacity (6MWT)       Able to understand and use rate of perceived exertion (RPE) scale Yes       Intervention Provide education and explanation on how to use RPE scale       Expected Outcomes Short Term: Able to use RPE  daily in rehab to express subjective intensity level;Long Term:  Able to use RPE to guide intensity level when exercising independently       Able to understand and use Dyspnea scale Yes       Intervention Provide education and explanation on how to use Dyspnea scale       Expected Outcomes Short Term: Able to use Dyspnea scale daily in rehab to express subjective sense of shortness of breath during exertion;Long Term: Able to use Dyspnea scale to guide intensity level when exercising independently       Knowledge and understanding of Target Heart Rate Range (THRR) Yes       Intervention Provide education and explanation of THRR including how the numbers were predicted and where they are located for reference       Expected Outcomes Short Term: Able to state/look up THRR;Long Term: Able to use THRR to govern intensity when exercising independently;Short Term: Able to use daily as guideline for intensity in rehab  Able to check pulse independently Yes       Intervention Provide education and demonstration on how to check pulse in carotid and radial arteries.;Review the importance of being able to check your own pulse for safety during independent exercise       Expected Outcomes Short Term: Able to explain why pulse checking is important during independent exercise;Long Term: Able to check pulse independently and accurately       Understanding of Exercise Prescription Yes       Intervention Provide education, explanation, and written materials on patient's individual exercise prescription       Expected Outcomes Short Term: Able to explain program exercise prescription;Long Term: Able to explain home exercise prescription to exercise independently

## 2022-12-12 NOTE — Progress Notes (Signed)
Cardiac Individual Treatment Plan  Patient Details  Name: Vanessa Young MRN: [id number] Date of Birth: 10-02-1941 Referring Provider:   Flowsheet Row Cardiac Rehab from 12/12/2022 in [facility] Cardiac and Pulmonary Rehab  Referring Provider Kathlyn Sacramento MD       Initial Encounter Date:  Flowsheet Row Cardiac Rehab from 12/12/2022 in [facility] Cardiac and Pulmonary Rehab  Date 12/12/22       Visit Diagnosis: ST elevation myocardial infarction (STEMI), unspecified artery ([place])  Status post coronary artery stent placement  Patient's Home Medications on Admission:  Current Outpatient Medications:    ACCU-CHEK GUIDE test strip, , Disp: , Rfl:    ALPRAZolam (XANAX) 0.25 MG tablet, Take 0.25 mg by mouth at bedtime as needed for sleep., Disp: , Rfl:    amLODipine (NORVASC) 5 MG tablet, Take 1 tablet (5 mg total) by mouth daily., Disp: 30 tablet, Rfl: 2   apixaban (ELIQUIS) 5 MG TABS tablet, Take 1 tablet (5 mg total) by mouth 2 (two) times daily., Disp: 60 tablet, Rfl: 2   carvedilol (COREG) 25 MG tablet, Take 1 tablet (25 mg total) by mouth 2 (two) times daily with a meal., Disp: 60 tablet, Rfl: 2   clopidogrel (PLAVIX) 75 MG tablet, Take 1 tablet (75 mg total) by mouth daily., Disp: 30 tablet, Rfl: 2   escitalopram (LEXAPRO) 10 MG tablet, Take 10 mg by mouth daily., Disp: , Rfl:    guanFACINE (TENEX) 1 MG tablet, Take 1 mg by mouth 2 (two) times daily. , Disp: , Rfl:    losartan (COZAAR) 100 MG tablet, Take 100 mg by mouth daily., Disp: , Rfl:    magnesium oxide (MAG-OX) 400 (240 Mg) MG tablet, Take 1 tablet by mouth daily., Disp: , Rfl:    metFORMIN (GLUMETZA) 500 MG (MOD) 24 hr tablet, Take 500 mg by mouth 2 (two) times daily with a meal., Disp: , Rfl:    pantoprazole (PROTONIX) 40 MG tablet, Take 1 tablet (40 mg total) by mouth daily., Disp: 30 tablet, Rfl: 1   rosuvastatin (CRESTOR) 40 MG tablet, Take 1 tablet (40 mg total) by mouth daily., Disp: 30 tablet, Rfl: 2  Past Medical  History: Past Medical History:  Diagnosis Date   A-fib ([place])    Depression    Diabetes mellitus without complication ([place])    GERD (gastroesophageal reflux disease)    Hypercholesteremia    Hypertension     Tobacco Use: Social History   Tobacco Use  Smoking Status Former   Packs/day: 0.25   Years: 20.00   Total pack years: 5.00   Types: Cigarettes   Quit date: 2002   Years since quitting: 22.0  Smokeless Tobacco Never    Labs: Review Flowsheet       Latest Ref Rng & Units 11/19/2022 11/24/2022  Labs for ITP Cardiac and Pulmonary Rehab  Cholestrol 0 - 200 mg/dL 269  166   LDL (calc) 0 - 99 mg/dL UNABLE TO CALCULATE IF TRIGLYCERIDE OVER 400 mg/dL  91   Direct LDL 0 - 99 mg/dL 154  -  HDL-C >40 mg/dL 51  34   Trlycerides <150 mg/dL 477  205   Hemoglobin A1c 4.8 - 5.6 % 7.2  -     Exercise Target Goals: Exercise Program Goal: Individual exercise prescription set using results from initial 6 min walk test and THRR while considering  patient's activity barriers and safety.   Exercise Prescription Goal: Initial exercise prescription builds to 30-45 minutes a day of aerobic activity,  2-3 days per week.  Home exercise guidelines will be given to patient during program as part of exercise prescription that the participant will acknowledge.   Education: Aerobic Exercise: - Group verbal and visual presentation on the components of exercise prescription. Introduces F.I.T.T principle from ACSM for exercise prescriptions.  Reviews F.I.T.T. principles of aerobic exercise including progression. Written material given at graduation. Flowsheet Row Cardiac Rehab from 12/12/2022 in [facility] Cardiac and Pulmonary Rehab  Education need identified 12/12/22       Education: Resistance Exercise: - Group verbal and visual presentation on the components of exercise prescription. Introduces F.I.T.T principle from ACSM for exercise prescriptions  Reviews F.I.T.T. principles of resistance  exercise including progression. Written material given at graduation.    Education: Exercise & Equipment Safety: - Individual verbal instruction and demonstration of equipment use and safety with use of the equipment. Flowsheet Row Cardiac Rehab from 12/12/2022 in [facility] Cardiac and Pulmonary Rehab  Date 12/12/22  Educator NT  Instruction Review Code 1- Verbalizes Understanding       Education: Exercise Physiology & General Exercise Guidelines: - Group verbal and written instruction with models to review the exercise physiology of the cardiovascular system and associated critical values. Provides general exercise guidelines with specific guidelines to those with heart or lung disease.    Education: Flexibility, Balance, Mind/Body Relaxation: - Group verbal and visual presentation with interactive activity on the components of exercise prescription. Introduces F.I.T.T principle from ACSM for exercise prescriptions. Reviews F.I.T.T. principles of flexibility and balance exercise training including progression. Also discusses the mind body connection.  Reviews various relaxation techniques to help reduce and manage stress (i.e. Deep breathing, progressive muscle relaxation, and visualization). Balance handout provided to take home. Written material given at graduation.   Activity Barriers & Risk Stratification:  Activity Barriers & Cardiac Risk Stratification - 12/12/22 1507       Activity Barriers & Cardiac Risk Stratification   Activity Barriers Balance Concerns;Muscular Weakness    Cardiac Risk Stratification High             6 Minute Walk:  6 Minute Walk     Row Name 12/12/22 1506         6 Minute Walk   Phase Initial     Distance 955 feet     Walk Time 6 minutes     # of Rest Breaks 0     MPH 1.81     METS 1.39     RPE 12     Perceived Dyspnea  0     VO2 Peak 4.87     Symptoms Yes (comment)     Comments leg fatigue     Resting HR 55 bpm     Resting BP 124/66      Resting Oxygen Saturation  98 %     Exercise Oxygen Saturation  during 6 min walk 96 %     Max Ex. HR 55 bpm     Max Ex. BP 134/66     2 Minute Post BP 122/64              Oxygen Initial Assessment:   Oxygen Re-Evaluation:   Oxygen Discharge (Final Oxygen Re-Evaluation):   Initial Exercise Prescription:  Initial Exercise Prescription - 12/12/22 1500       Date of Initial Exercise RX and Referring Provider   Date 12/12/22    Referring Provider Kathlyn Sacramento MD      Oxygen   Maintain Oxygen Saturation 88% or  higher      Treadmill   MPH 1.6    Grade 0    Minutes 15    METs 2.23      NuStep   Level 2    SPM 80    Minutes 1.39    METs 1.39      Biostep-RELP   Level 1    SPM 50    Minutes 15    METs 1.39      Track   Laps 18    Minutes 15    METs 1.98      Prescription Details   Frequency (times per week) 3    Duration Progress to 30 minutes of continuous aerobic without signs/symptoms of physical distress      Intensity   THRR 40-80% of Max Heartrate 88-121    Ratings of Perceived Exertion 11-13    Perceived Dyspnea 0-4      Progression   Progression Continue to progress workloads to maintain intensity without signs/symptoms of physical distress.      Resistance Training   Training Prescription Yes    Weight 2 lb    Reps 10-15             Perform Capillary Blood Glucose checks as needed.  Exercise Prescription Changes:   Exercise Prescription Changes     Row Name 12/12/22 1500             Response to Exercise   Blood Pressure (Admit) 124/66       Blood Pressure (Exercise) 134/66       Blood Pressure (Exit) 122/64       Heart Rate (Admit) 55 bpm       Heart Rate (Exercise) 93 bpm       Heart Rate (Exit) 56 bpm       Oxygen Saturation (Admit) 98 %       Oxygen Saturation (Exercise) 96 %       Rating of Perceived Exertion (Exercise) 12       Perceived Dyspnea (Exercise) 0       Symptoms leg fatigue       Comments 6MWT  Results                Exercise Comments:   Exercise Goals and Review:   Exercise Goals     Row Name 12/12/22 1500             Exercise Goals   Increase Physical Activity Yes       Intervention Provide advice, education, support and counseling about physical activity/exercise needs.;Develop an individualized exercise prescription for aerobic and resistive training based on initial evaluation findings, risk stratification, comorbidities and participant's personal goals.       Expected Outcomes Short Term: Attend rehab on a regular basis to increase amount of physical activity.;Long Term: Add in home exercise to make exercise part of routine and to increase amount of physical activity.;Long Term: Exercising regularly at least 3-5 days a week.       Increase Strength and Stamina Yes       Intervention Provide advice, education, support and counseling about physical activity/exercise needs.;Develop an individualized exercise prescription for aerobic and resistive training based on initial evaluation findings, risk stratification, comorbidities and participant's personal goals.       Expected Outcomes Short Term: Increase workloads from initial exercise prescription for resistance, speed, and METs.;Short Term: Perform resistance training exercises routinely during rehab and add in resistance training at home;Long Term: Improve  cardiorespiratory fitness, muscular endurance and strength as measured by increased METs and functional capacity (6MWT)       Able to understand and use rate of perceived exertion (RPE) scale Yes       Intervention Provide education and explanation on how to use RPE scale       Expected Outcomes Short Term: Able to use RPE daily in rehab to express subjective intensity level;Long Term:  Able to use RPE to guide intensity level when exercising independently       Able to understand and use Dyspnea scale Yes       Intervention Provide education and explanation on how  to use Dyspnea scale       Expected Outcomes Short Term: Able to use Dyspnea scale daily in rehab to express subjective sense of shortness of breath during exertion;Long Term: Able to use Dyspnea scale to guide intensity level when exercising independently       Knowledge and understanding of Target Heart Rate Range (THRR) Yes       Intervention Provide education and explanation of THRR including how the numbers were predicted and where they are located for reference       Expected Outcomes Short Term: Able to state/look up THRR;Long Term: Able to use THRR to govern intensity when exercising independently;Short Term: Able to use daily as guideline for intensity in rehab       Able to check pulse independently Yes       Intervention Provide education and demonstration on how to check pulse in carotid and radial arteries.;Review the importance of being able to check your own pulse for safety during independent exercise       Expected Outcomes Short Term: Able to explain why pulse checking is important during independent exercise;Long Term: Able to check pulse independently and accurately       Understanding of Exercise Prescription Yes       Intervention Provide education, explanation, and written materials on patient's individual exercise prescription       Expected Outcomes Short Term: Able to explain program exercise prescription;Long Term: Able to explain home exercise prescription to exercise independently                Exercise Goals Re-Evaluation :   Discharge Exercise Prescription (Final Exercise Prescription Changes):  Exercise Prescription Changes - 12/12/22 1500       Response to Exercise   Blood Pressure (Admit) 124/66    Blood Pressure (Exercise) 134/66    Blood Pressure (Exit) 122/64    Heart Rate (Admit) 55 bpm    Heart Rate (Exercise) 93 bpm    Heart Rate (Exit) 56 bpm    Oxygen Saturation (Admit) 98 %    Oxygen Saturation (Exercise) 96 %    Rating of Perceived  Exertion (Exercise) 12    Perceived Dyspnea (Exercise) 0    Symptoms leg fatigue    Comments 6MWT Results             Nutrition:  Target Goals: Understanding of nutrition guidelines, daily intake of sodium '1500mg'$ , cholesterol '200mg'$ , calories 30% from fat and 7% or less from saturated fats, daily to have 5 or more servings of fruits and vegetables.  Education: All About Nutrition: -Group instruction provided by verbal, written material, interactive activities, discussions, models, and posters to present general guidelines for heart healthy nutrition including fat, fiber, MyPlate, the role of sodium in heart healthy nutrition, utilization of the nutrition label, and utilization of this knowledge for  meal planning. Follow up email sent as well. Written material given at graduation.   Biometrics:  Pre Biometrics - 12/12/22 1500       Pre Biometrics   Height 4' 10.5" (1.486 m)    Weight 136 lb 12.8 oz (62.1 kg)    Waist Circumference 37.5 inches    Hip Circumference 41 inches    Waist to Hip Ratio 0.91 %    BMI (Calculated) 28.1    Single Leg Stand 1.9 seconds              Nutrition Therapy Plan and Nutrition Goals:  Nutrition Therapy & Goals - 12/12/22 1458       Intervention Plan   Intervention Prescribe, educate and counsel regarding individualized specific dietary modifications aiming towards targeted core components such as weight, hypertension, lipid management, diabetes, heart failure and other comorbidities.    Expected Outcomes Short Term Goal: A plan has been developed with personal nutrition goals set during dietitian appointment.;Short Term Goal: Understand basic principles of dietary content, such as calories, fat, sodium, cholesterol and nutrients.;Long Term Goal: Adherence to prescribed nutrition plan.             Nutrition Assessments:  MEDIFICTS Score Key: ?70 Need to make dietary changes  40-70 Heart Healthy Diet ? 40 Therapeutic Level  Cholesterol Diet  Flowsheet Row Cardiac Rehab from 12/12/2022 in [facility] Cardiac and Pulmonary Rehab  Picture Your Plate Total Score on Admission 61      Picture Your Plate Scores: <20 Unhealthy dietary pattern with much room for improvement. 41-50 Dietary pattern unlikely to meet recommendations for good health and room for improvement. 51-60 More healthful dietary pattern, with some room for improvement.  >60 Healthy dietary pattern, although there may be some specific behaviors that could be improved.    Nutrition Goals Re-Evaluation:   Nutrition Goals Discharge (Final Nutrition Goals Re-Evaluation):   Psychosocial: Target Goals: Acknowledge presence or absence of significant depression and/or stress, maximize coping skills, provide positive support system. Participant is able to verbalize types and ability to use techniques and skills needed for reducing stress and depression.   Education: Stress, Anxiety, and Depression - Group verbal and visual presentation to define topics covered.  Reviews how body is impacted by stress, anxiety, and depression.  Also discusses healthy ways to reduce stress and to treat/manage anxiety and depression.  Written material given at graduation.   Education: Sleep Hygiene -Provides group verbal and written instruction about how sleep can affect your health.  Define sleep hygiene, discuss sleep cycles and impact of sleep habits. Review good sleep hygiene tips.    Initial Review & Psychosocial Screening:  Initial Psych Review & Screening - 11/28/22 1016       Initial Review   Current issues with Current Anxiety/Panic;Current Stress Concerns;History of Depression;Current Psychotropic Meds    Source of Stress Concerns Unable to participate in former interests or hobbies      [place]? Yes   daughter; church family     Barriers   Psychosocial barriers to participate in program There are no identifiable barriers or  psychosocial needs.;The patient should benefit from training in stress management and relaxation.      Screening Interventions   Interventions Provide feedback about the scores to participant;Encouraged to exercise;To provide support and resources with identified psychosocial needs    Expected Outcomes Short Term goal: Utilizing psychosocial counselor, staff and physician to assist with identification of specific Stressors or current issues  interfering with healing process. Setting desired goal for each stressor or current issue identified.;Long Term Goal: Stressors or current issues are controlled or eliminated.;Short Term goal: Identification and review with participant of any Quality of Life or Depression concerns found by scoring the questionnaire.;Long Term goal: The participant improves quality of Life and PHQ9 Scores as seen by post scores and/or verbalization of changes             Quality of Life Scores:   Quality of Life - 12/12/22 1457       Quality of Life   Select Quality of Life      Quality of Life Scores   Health/Function Pre 21.8 %    Socioeconomic Pre 24.38 %    Psych/Spiritual Pre 30 %    Family Pre 27 %    GLOBAL Pre 24.77 %            Scores of 19 and below usually indicate a poorer quality of life in these areas.  A difference of  2-3 points is a clinically meaningful difference.  A difference of 2-3 points in the total score of the Quality of Life Index has been associated with significant improvement in overall quality of life, self-image, physical symptoms, and general health in studies assessing change in quality of life.  PHQ-9: Review Flowsheet       05/30/2017  Depression screen PHQ 2/9  Decreased Interest 0  Down, Depressed, Hopeless 0  PHQ - 2 Score 0   Interpretation of Total Score  Total Score Depression Severity:  1-4 = Minimal depression, 5-9 = Mild depression, 10-14 = Moderate depression, 15-19 = Moderately severe depression, 20-27 =  Severe depression   Psychosocial Evaluation and Intervention:  Psychosocial Evaluation - 11/28/22 1026       Psychosocial Evaluation & Interventions   Interventions Encouraged to exercise with the program and follow exercise prescription    Comments Ms. Thekla is coming to cardiac rehab after a MI and stent. A few days after being discharged, she was readmitted with mini strokes. She states she has recovered fine and has no deficits. She has a great support system of her daughter and church family that help out whenever she needs it. She is sleeping well with her CPAP. She is on Lexapro after starting it for depression after her husbadn passed years ago. She said she felt better all around on it and has chosen to stay on it. When asked about stress, she mentioned she tries not to stress about much. She is trying to make sure she gets her health under control and is ready to start the program.    Expected Outcomes Short: attend cardiac rehab for education and exercise. Long: develop and maintain positive self care habits    Continue Psychosocial Services  Follow up required by staff             Psychosocial Re-Evaluation:   Psychosocial Discharge (Final Psychosocial Re-Evaluation):   Vocational Rehabilitation: Provide vocational rehab assistance to qualifying candidates.   Vocational Rehab Evaluation & Intervention:  Vocational Rehab - 11/28/22 1016       Initial Vocational Rehab Evaluation & Intervention   Assessment shows need for Vocational Rehabilitation No             Education: Education Goals: Education classes will be provided on a variety of topics geared toward better understanding of heart health and risk factor modification. Participant will state understanding/return demonstration of topics presented as noted by education test  scores.  Learning Barriers/Preferences:  Learning Barriers/Preferences - 11/28/22 1016       Learning Barriers/Preferences   Learning  Barriers None    Learning Preferences Individual Instruction             General Cardiac Education Topics:  AED/CPR: - Group verbal and written instruction with the use of models to demonstrate the basic use of the AED with the basic ABC's of resuscitation.   Anatomy and Cardiac Procedures: - Group verbal and visual presentation and models provide information about basic cardiac anatomy and function. Reviews the testing methods done to diagnose heart disease and the outcomes of the test results. Describes the treatment choices: Medical Management, Angioplasty, or Coronary Bypass Surgery for treating various heart conditions including Myocardial Infarction, Angina, Valve Disease, and Cardiac Arrhythmias.  Written material given at graduation. Flowsheet Row Cardiac Rehab from 12/12/2022 in [facility] Cardiac and Pulmonary Rehab  Education need identified 12/12/22       Medication Safety: - Group verbal and visual instruction to review commonly prescribed medications for heart and lung disease. Reviews the medication, class of the drug, and side effects. Includes the steps to properly store meds and maintain the prescription regimen.  Written material given at graduation.   Intimacy: - Group verbal instruction through game format to discuss how heart and lung disease can affect sexual intimacy. Written material given at graduation..   Know Your Numbers and Heart Failure: - Group verbal and visual instruction to discuss disease risk factors for cardiac and pulmonary disease and treatment options.  Reviews associated critical values for Overweight/Obesity, Hypertension, Cholesterol, and Diabetes.  Discusses basics of heart failure: signs/symptoms and treatments.  Introduces Heart Failure Zone chart for action plan for heart failure.  Written material given at graduation.   Infection Prevention: - Provides verbal and written material to individual with discussion of infection control  including proper hand washing and proper equipment cleaning during exercise session. Flowsheet Row Cardiac Rehab from 12/12/2022 in [facility] Cardiac and Pulmonary Rehab  Date 12/12/22  Educator NT  Instruction Review Code 1- Verbalizes Understanding       Falls Prevention: - Provides verbal and written material to individual with discussion of falls prevention and safety. Flowsheet Row Cardiac Rehab from 12/12/2022 in [facility] Cardiac and Pulmonary Rehab  Date 12/12/22  Educator NT  Instruction Review Code 1- Verbalizes Understanding       Other: -Provides group and verbal instruction on various topics (see comments)   Knowledge Questionnaire Score:  Knowledge Questionnaire Score - 12/12/22 1459       Knowledge Questionnaire Score   Pre Score 22/26             Core Components/Risk Factors/Patient Goals at Admission:  Personal Goals and Risk Factors at Admission - 12/12/22 1459       Core Components/Risk Factors/Patient Goals on Admission   Diabetes Yes    Intervention Provide education about signs/symptoms and action to take for hypo/hyperglycemia.;Provide education about proper nutrition, including hydration, and aerobic/resistive exercise prescription along with prescribed medications to achieve blood glucose in normal ranges: Fasting glucose 65-99 mg/dL    Expected Outcomes Short Term: Participant verbalizes understanding of the signs/symptoms and immediate care of hyper/hypoglycemia, proper foot care and importance of medication, aerobic/resistive exercise and nutrition plan for blood glucose control.;Long Term: Attainment of HbA1C < 7%.    Hypertension Yes    Intervention Provide education on lifestyle modifcations including regular physical activity/exercise, weight management, moderate sodium restriction and increased consumption of fresh  fruit, vegetables, and low fat dairy, alcohol moderation, and smoking cessation.;Monitor prescription use compliance.    Expected  Outcomes Short Term: Continued assessment and intervention until BP is < 140/26m HG in hypertensive participants. < 130/881mHG in hypertensive participants with diabetes, heart failure or chronic kidney disease.;Long Term: Maintenance of blood pressure at goal levels.    Lipids Yes    Intervention Provide education and support for participant on nutrition & aerobic/resistive exercise along with prescribed medications to achieve LDL '70mg'$ , HDL >'40mg'$ .    Expected Outcomes Short Term: Participant states understanding of desired cholesterol values and is compliant with medications prescribed. Participant is following exercise prescription and nutrition guidelines.;Long Term: Cholesterol controlled with medications as prescribed, with individualized exercise RX and with personalized nutrition plan. Value goals: LDL < '70mg'$ , HDL > 40 mg.             Education:Diabetes - Individual verbal and written instruction to review signs/symptoms of diabetes, desired ranges of glucose level fasting, after meals and with exercise. Acknowledge that pre and post exercise glucose checks will be done for 3 sessions at entry of program. [place] [facility]  Date 11/28/22  Educator [facility]ruction Review Code 1- Verbalizes Understanding       Core Components/Risk Factors/Patient Goals Review:    Core Components/Risk Factors/Patient Goals at Discharge (Final Review):    ITP Comments:  ITP Comments     RoRaleighame 11/28/22 1024 12/12/22 1456         ITP Comments Initial phone call completed. [facility]. EP Orientation scheduled for Wednesday 12/27 at 1:30. Completed 6MWT and gym orientation. Initial ITP created and sent for review to Dr. MaEmily FilbertMedical Director.               Comments: Initial ITP

## 2022-12-18 ENCOUNTER — Telehealth: Payer: Self-pay

## 2022-12-18 ENCOUNTER — Other Ambulatory Visit: Payer: Self-pay

## 2022-12-18 ENCOUNTER — Encounter: Payer: Self-pay | Admitting: Physician Assistant

## 2022-12-18 ENCOUNTER — Ambulatory Visit: Payer: Medicare PPO | Attending: Physician Assistant | Admitting: Physician Assistant

## 2022-12-18 VITALS — BP 142/80 | HR 57 | Ht <= 58 in | Wt 134.8 lb

## 2022-12-18 DIAGNOSIS — I480 Paroxysmal atrial fibrillation: Secondary | ICD-10-CM | POA: Diagnosis not present

## 2022-12-18 DIAGNOSIS — E785 Hyperlipidemia, unspecified: Secondary | ICD-10-CM

## 2022-12-18 DIAGNOSIS — I2510 Atherosclerotic heart disease of native coronary artery without angina pectoris: Secondary | ICD-10-CM

## 2022-12-18 DIAGNOSIS — Z8673 Personal history of transient ischemic attack (TIA), and cerebral infarction without residual deficits: Secondary | ICD-10-CM | POA: Insufficient documentation

## 2022-12-18 DIAGNOSIS — I2111 ST elevation (STEMI) myocardial infarction involving right coronary artery: Secondary | ICD-10-CM | POA: Diagnosis not present

## 2022-12-18 DIAGNOSIS — I10 Essential (primary) hypertension: Secondary | ICD-10-CM

## 2022-12-18 MED ORDER — ROSUVASTATIN CALCIUM 40 MG PO TABS: 40.0000 mg | ORAL_TABLET | Freq: Every day | ORAL | 2 refills | Status: DC

## 2022-12-18 MED ORDER — CLOPIDOGREL BISULFATE 75 MG PO TABS: 75.0000 mg | ORAL_TABLET | Freq: Every day | ORAL | 2 refills | Status: AC

## 2022-12-18 MED ORDER — LOSARTAN POTASSIUM 100 MG PO TABS: 100.0000 mg | ORAL_TABLET | Freq: Every day | ORAL | 2 refills | Status: DC

## 2022-12-18 MED ORDER — APIXABAN 5 MG PO TABS: 5.0000 mg | ORAL_TABLET | Freq: Two times a day (BID) | ORAL | 2 refills | Status: DC

## 2022-12-18 MED ORDER — AMLODIPINE BESYLATE 5 MG PO TABS: 5.0000 mg | ORAL_TABLET | Freq: Every day | ORAL | 2 refills | Status: DC

## 2022-12-18 MED ORDER — CARVEDILOL 25 MG PO TABS: 25.0000 mg | ORAL_TABLET | Freq: Two times a day (BID) | ORAL | 2 refills | Status: DC

## 2022-12-18 NOTE — Telephone Encounter (Signed)
*  STAT* If patient is at the pharmacy, call can be transferred to refill team.   1. Which medications need to be refilled? (please list name of each medication and dose if known) Eliquis  2. Which pharmacy/location (including street and city if local pharmacy) is medication to be sent to? [facility]  3. Do they need a 30 day or 90 day supply? [place]

## 2022-12-18 NOTE — Progress Notes (Signed)
Cardiology Office Note    Date:  12/18/2022   ID:  [name], DOB 19-Nov-1941, MRN [id number]  PCP:  Myrtie Soman, MD  Cardiologist:  Kathlyn Sacramento, MD  Electrophysiologist:  None   Chief Complaint: Hospital follow-up  History of Present Illness:   Vanessa Young is a 82 y.o. female with history of CAD with inferior STEMI in 11/2022, PAF on apixaban, CVA, DM2, HTN, and HLD who presents for hospital follow-up as outlined below.  She was previously followed by Drs. Barbette Merino, with [facility], for A-fib with transition of her care to our office during admission in 11/2022.  Prior to her admission in 11/2022, she had no known history of ischemic heart disease.  She was seen in the ED on 11/18/2022 with generalized weakness and headache and felt to be volume depleted with symptomatic improvement following IV fluids.  Following discharge, she began to have substernal chest pain described as a burning sensation across the whole chest radiating into the bilateral arms.  She slept and felt better, though was woken up with substernal chest tightness and heartburn.  EMS was called with EKG in the field showing evidence of an inferior ST elevation MI leading to activation of code STEMI.  Upon cardiology evaluation, she was still having angina.  She reported that she did not take Xarelto daily, instead took this 3 times weekly.  Emergent LHC showed severe three-vessel CAD with the culprit lesion being a subtotal occlusion of the mid RCA.  In addition, there was an occluded OM1 with collaterals and diffuse subocclusive disease throughout the mid and distal LAD.  Moderately reduced LV systolic function estimated at 35 to 45% with severely elevated LVEDP at 35 mmHg.  She underwent successful PCI/DES to the mid RCA.  It was felt the LAD was not optimal for PCI given long diffuse disease with medical therapy favored.  Post intervention echo during the admission showed an EF of 60 to 65%, mild hypokinesis  of the basal mid inferior wall and inferolateral wall, mild LVH, grade 1 diastolic dysfunction, normal RV systolic function and ventricular cavity size, and trivial aortic insufficiency.  High-sensitivity troponin peaked at 3612.  From a cardiac perspective, she was discharged on apixaban, clopidogrel, carvedilol, amlodipine, losartan, and rosuvastatin.  Following discharge, she was readmitted to the hospital with sudden onset of left-sided weakness with expressive aphasia and dysarthria which resolved prior to presentation.  MRI of the brain notable for multiple small embolic infarcts.  She was evaluated by neurology with recommendation to continue apixaban and clopidogrel as well as recently titrated dose of rosuvastatin.  Prilosec was changed to Protonix.  She comes in today accompanied by her daughter and is doing very well from a cardiac perspective.  She is without symptoms of angina or decompensation.  No residual deficits from her CVA.  No progressive dyspnea, orthopnea, lower extremity swelling, PND, or early satiety.  She has been adherent to all cardiac medications.  No falls or symptoms concerning for bleeding.  No frank syncope.  She does note some fatigue that develops after taking morning medications and typically improves in the late afternoon.  Otherwise, she is without issues.    Labs independently reviewed: 11/2022 - TC 166, TG 205, HDL 34, LDL 91, potassium 4.3, BUN 36, serum creatinine 1.46, albumin 3.5, AST/ALT normal, Hgb 13.2, PLT 277, magnesium 2.0, LP(a) 17.6, A1c 7.2  Past Medical History:  Diagnosis Date   A-fib [facility])    Depression  Diabetes mellitus without complication (HCC)    GERD (gastroesophageal reflux disease)    Hypercholesteremia    Hypertension     Past Surgical History:  Procedure Laterality Date   CORONARY/GRAFT ACUTE MI REVASCULARIZATION N/A 11/19/2022   Procedure: Coronary/Graft Acute MI Revascularization;  Surgeon: Wellington Hampshire, MD;  Location:  [place] [facility];  Service: Cardiovascular;  Laterality: N/A;   CYSTOSCOPY     LEFT HEART CATH AND CORONARY ANGIOGRAPHY N/A 11/19/2022   Procedure: LEFT HEART CATH AND CORONARY ANGIOGRAPHY;  Surgeon: Wellington Hampshire, MD;  Location: [place] [facility];  Service: Cardiovascular;  Laterality: N/A;    Current Medications: No outpatient medications have been marked as taking for the 12/18/22 encounter (Office Visit) with Rise Mu, PA-C.    Allergies:   Keflex [cephalexin], Hydrochlorothiazide w-triamterene, Lisinopril, Penicillins, and Statins   Social History   Socioeconomic History   Marital status: Widowed    Spouse name: Not on file   Number of children: Not on file   Years of education: Not on file   Highest education level: Not on file  Occupational History   Not on file  Tobacco Use   Smoking status: Former    Packs/day: 0.25    Years: 20.00    Total pack years: 5.00    Types: Cigarettes    Quit date: 2002    Years since quitting: 22.0   Smokeless tobacco: Never  Vaping Use   Vaping Use: Never used  Substance and Sexual Activity   Alcohol use: No   Drug use: No   Sexual activity: Not on file  Other Topics Concern   Not on file  Social History Narrative   Not on file   Social Determinants of Health   Financial Resource Strain: Not on file  Food Insecurity: No Food Insecurity (11/19/2022)   Hunger Vital Sign    Worried About Running Out of Food in the Last Year: Never true    Ran Out of Food in the Last Year: Never true  Transportation Needs: No Transportation Needs (11/19/2022)   PRAPARE - Hydrologist (Medical): No    Lack of Transportation (Non-Medical): No  Physical Activity: Not on file  Stress: Not on file  Social Connections: Not on file     Family History:  The patient's family history includes CVA in her father and mother; Diabetes in her mother.  ROS:   12-point review of system is negative unless otherwise  noted in the HPI.   EKGs/Labs/Other Studies Reviewed:    Studies reviewed were summarized above. The additional studies were reviewed today:  2D echo 11/10/2018: - Left ventricle: Wall thickness was increased in a pattern of mild    LVH. Systolic function was normal. The estimated ejection    fraction was in the range of 55% to 60%. Doppler parameters are    consistent with abnormal left ventricular relaxation (grade 1    diastolic dysfunction).  - Right ventricle: The cavity size was mildly dilated.  - Pulmonary arteries: PA peak pressure: 37 mm Hg (S).  __________  LHC 11/19/2022:   1st Mrg lesion is 100% stenosed.   Prox LAD to Mid LAD lesion is 40% stenosed.   1st Diag lesion is 60% stenosed.   Mid LAD to Dist LAD lesion is 99% stenosed.   RPDA lesion is 85% stenosed.   Prox RCA to Mid RCA lesion is 40% stenosed.   Mid RCA to Dist RCA lesion  is 99% stenosed.   A drug-eluting stent was successfully placed using a STENT ONYX FRONTIER 3.5X30.   Post intervention, there is a 0% residual stenosis.   There is moderate left ventricular systolic dysfunction.   LV end diastolic pressure is severely elevated.   The left ventricular ejection fraction is 35-45% by visual estimate.   1.  Severe three-vessel coronary artery disease.  The culprit is a subtotal occlusion of the mid right coronary artery.  In addition, the patient has occluded OM1 with collaterals and diffuse subocclusive disease throughout the mid and distal LAD. 2.  Moderately reduced LV systolic function with severely elevated left ventricular end-diastolic pressure 35 mmHg. 3.  Successful angioplasty and drug-eluting stent placement to the mid right coronary artery. 4.  Door to device was delayed by difficult access via the right radial artery given calcified, tortuous and stenosed proximal radial artery.  This was navigated successfully with a run-through wire using balloon assisted tracking.   Recommendations: Will treat  with aspirin and ticagrelor for now. The patient is chronically anticoagulated for atrial fibrillation.  Anticoagulation can likely be resumed tomorrow if no bleeding issues but should consider switching Xarelto to Eliquis and stopping aspirin to minimize the risk of bleeding considering her age. I do not think the LAD is optimal for PCI given long diffuse disease.  Favor medical therapy. __________  2D echo 11/20/2022: 1. Left ventricular ejection fraction, by estimation, is 60 to 65%. The  left ventricle has normal function. The left ventricle demonstrates  regional wall motion abnormalities (see scoring diagram/findings for  description). There is mild left ventricular   hypertrophy. Left ventricular diastolic parameters are consistent with  Grade I diastolic dysfunction (impaired relaxation). Elevated left atrial  pressure. There is mild hypokinesis of the left ventricular, basal-mid  inferior wall and inferolateral wall.   2. Right ventricular systolic function is normal. The right ventricular  size is normal. Tricuspid regurgitation signal is inadequate for assessing  PA pressure.   3. The mitral valve is normal in structure. No evidence of mitral valve  regurgitation. No evidence of mitral stenosis.   4. The aortic valve is tricuspid. Aortic valve regurgitation is trivial.  No aortic stenosis is present.    EKG:  EKG is ordered today.  The EKG ordered today demonstrates sinus bradycardia, nonspecific ST-T changes  Recent Labs: 11/22/2022: Magnesium 2.0 11/23/2022: ALT 17; BUN 36; Creatinine, Ser 1.46; Hemoglobin 13.2; Platelets 277; Potassium 4.3; Sodium 140  Recent Lipid Panel    Component Value Date/Time   CHOL 166 11/24/2022 0359   TRIG 205 (H) 11/24/2022 0359   HDL 34 (L) 11/24/2022 0359   CHOLHDL 4.9 11/24/2022 0359   VLDL 41 (H) 11/24/2022 0359   LDLCALC 91 11/24/2022 0359   LDLDIRECT 154 (H) 11/19/2022 1446    PHYSICAL EXAM:    VS:  BP (!) 142/80 (BP Location:  Left Arm, Patient Position: Sitting, Cuff Size: Normal)   Pulse (!) 57   Ht '4\' 9"'$  (1.448 m)   Wt 134 lb 12.8 oz (61.1 kg)   SpO2 97%   BMI 29.17 kg/m   BMI: Body mass index is 29.17 kg/m.  Physical Exam Vitals reviewed.  Constitutional:      Appearance: She is well-developed.  HENT:     Head: Normocephalic and atraumatic.  Eyes:     General:        Right eye: No discharge.        Left eye: No discharge.  Neck:  Vascular: No JVD.  Cardiovascular:     Rate and Rhythm: Normal rate and regular rhythm.     Pulses:          Radial pulses are 2+ on the right side.       Posterior tibial pulses are 2+ on the right side and 2+ on the left side.     Heart sounds: Normal heart sounds, S1 normal and S2 normal. Heart sounds not distant. No midsystolic click and no opening snap. No murmur heard.    No friction rub.     Comments: Right radial arteriotomy site has healed well, without ecchymosis, bleeding, swelling, erythema, or tenderness to palpation.  Radial pulse 2+ proximal and distal to the arteriotomy site. Pulmonary:     Effort: Pulmonary effort is normal. No respiratory distress.     Breath sounds: Normal breath sounds. No decreased breath sounds, wheezing or rales.  Chest:     Chest wall: No tenderness.  Abdominal:     General: There is no distension.  Musculoskeletal:     Cervical back: Normal range of motion.     Right lower leg: No edema.     Left lower leg: No edema.  Skin:    General: Skin is warm and dry.     Nails: There is no clubbing.  Neurological:     Mental Status: She is alert and oriented to person, place, and time.  Psychiatric:        Speech: Speech normal.        Behavior: Behavior normal.        Thought Content: Thought content normal.        Judgment: Judgment normal.     Wt Readings from Last 3 Encounters:  12/18/22 134 lb 12.8 oz (61.1 kg)  12/12/22 136 lb 12.8 oz (62.1 kg)  11/23/22 138 lb 3.7 oz (62.7 kg)     ASSESSMENT & PLAN:   CAD  involving the native coronary arteries with recent inferior STEMI: She is doing well and is without symptoms concerning for angina or decompensation.  She is on apixaban in place of aspirin given underlying A-fib which will be continued with clopidogrel with antiplatelet therapy not being interrupted for 12 months dated back to date of PCI.  Continue aggressive risk factor modification and secondary prevention with titrated dose of rosuvastatin, losartan, and carvedilol.  She will be participating in cardiac rehab.  PAF: Maintaining sinus rhythm with a mildly bradycardic rate.  Continue carvedilol.  CHA2DS2-VASc at least 8.  She remains on apixaban 5 mg twice daily and does not meet reduced dosing criteria.  No symptoms concerning for bleeding.  Recent labs stable.  Follow-up CBC and CMP in 1 month.  HTN: Blood pressure reasonably controlled in the office today.  Continue to monitor.  No changes in medical therapy at this time.  HLD: LDL 91 with a triglyceride of 205 with normal AST/ALT in 11/2022.  Goal LDL less than 55.  Now on higher dose rosuvastatin 40 mg daily.  Follow-up fasting lipid panel and LFT in 1 month with recommendation to escalate therapy as indicated to achieve target LDL and triglyceride.  DM2: A1c 7.2.  Ongoing management per PCP.  CVA: No residual deficits.  She remains on apixaban and clopidogrel along with rosuvastatin as outlined above.     Disposition: F/u with Dr. Fletcher Anon or an APP in 1 month.   Medication Adjustments/Labs and Tests Ordered: Current medicines are reviewed at length with the patient today.  Concerns regarding medicines are outlined above. Medication changes, Labs and Tests ordered today are summarized above and listed in the Patient Instructions accessible in Encounters.   Signed, Christell Faith, PA-C 12/18/2022 5:01 PM     [address] [place], [place] [address] [phone]

## 2022-12-18 NOTE — Telephone Encounter (Signed)
Prescription refill request for Eliquis received. Indication:afib Last office visit:1/24 Scr:1.4 Age: 82 Weight:61.1  kg  Prescription refilled

## 2022-12-18 NOTE — Patient Instructions (Signed)
Medication Instructions:  Your physician recommends that you continue on your current medications as directed. Please refer to the Current Medication list given to you today.  *If you need a refill on your cardiac medications before your next appointment, please call your pharmacy*   Lab Work: CMP, lipid panel, and CBC to be drawn prior to next appointment. - Please go to the [facility]. You will check in at the front desk to the right as you walk into the atrium. Valet Parking is offered if needed. - No appointment needed. You may go any day between 7 am and 6 pm.  If you have labs (blood work) drawn today and your tests are completely normal, you will receive your results only by: [place] (if you have MyChart) OR A paper copy in the mail If you have any lab test that is abnormal or we need to change your treatment, we will call you to review the results.   Testing/Procedures: No testing ordered  Follow-Up: At [facility], you and your health needs are our priority.  As part of our continuing mission to provide you with exceptional heart care, we have created designated Provider Care Teams.  These Care Teams include your primary Cardiologist (physician) and Advanced Practice Providers (APPs -  Physician Assistants and Nurse Practitioners) who all work together to provide you with the care you need, when you need it.  We recommend signing up for the patient portal called "MyChart".  Sign up information is provided on this After Visit Summary.  MyChart is used to connect with patients for Virtual Visits (Telemedicine).  Patients are able to view lab/test results, encounter notes, upcoming appointments, etc.  Non-urgent messages can be sent to your provider as well.   To learn more about what you can do with MyChart, go to [email or web address].    Your next appointment:   1 month(s)  The format for your next appointment:   In Person  Provider:   Christell Faith,  PA-C    Important Information About Sugar

## 2022-12-19 NOTE — Addendum Note (Signed)
Addended by: Nestor Ramp on: 12/19/2022 04:01 PM   Modules accepted: Orders

## 2022-12-21 ENCOUNTER — Encounter: Payer: Medicare PPO | Attending: Cardiovascular Disease | Admitting: *Deleted

## 2022-12-21 DIAGNOSIS — Z955 Presence of coronary angioplasty implant and graft: Secondary | ICD-10-CM | POA: Insufficient documentation

## 2022-12-21 DIAGNOSIS — Z5189 Encounter for other specified aftercare: Secondary | ICD-10-CM | POA: Diagnosis not present

## 2022-12-21 DIAGNOSIS — I213 ST elevation (STEMI) myocardial infarction of unspecified site: Secondary | ICD-10-CM

## 2022-12-21 DIAGNOSIS — I252 Old myocardial infarction: Secondary | ICD-10-CM | POA: Insufficient documentation

## 2022-12-21 LAB — GLUCOSE, CAPILLARY
Glucose-Capillary: 232 mg/dL — ABNORMAL HIGH (ref 70–99)
Glucose-Capillary: 151 mg/dL — ABNORMAL HIGH (ref 70–99)

## 2022-12-21 NOTE — Progress Notes (Signed)
Daily Session Note  Patient Details  Name: LEAHMARIE GASIOROWSKI MRN: [id number] Date of Birth: 1941-05-29 Referring Provider:   Flowsheet Row Cardiac Rehab from 12/12/2022 in [facility] Cardiac and Pulmonary Rehab  Referring Provider Kathlyn Sacramento MD       Encounter Date: 12/21/2022  Check In:  Session Check In - 12/21/22 1153       Check-In   Supervising physician immediately available to respond to emergencies See telemetry face sheet for immediately available ER MD    Location [facility]-Cardiac & Pulmonary Rehab    Staff Present Heath Lark, RN, BSN, CCRP;Jessica Camanche [place], MA, RCEP, CCRP, CCET;Joseph [place], [place]    Virtual Visit No    Medication changes reported     No    Fall or balance concerns reported    No    Warm-up and Cool-down Performed on first and last piece of equipment    Resistance Training Performed Yes    VAD Patient? No    PAD/SET Patient? No      Pain Assessment   Currently in Pain? No/denies                Social History   Tobacco Use  Smoking Status Former   Packs/day: 0.25   Years: 20.00   Total pack years: 5.00   Types: Cigarettes   Quit date: 2002   Years since quitting: 22.0  Smokeless Tobacco Never    Goals Met:  Exercise tolerated well Personal goals reviewed No report of concerns or symptoms today  Goals Unmet:  Not Applicable  Comments: First full day of exercise!  Patient was oriented to gym and equipment including functions, settings, policies, and procedures.  Patient's individual exercise prescription and treatment plan were reviewed.  All starting workloads were established based on the results of the 6 minute walk test done at initial orientation visit.  The plan for exercise progression was also introduced and progression will be customized based on patient's performance and goals.    Dr. Emily Filbert is Medical Director for [place].  Dr. Ottie Glazier is Medical Director for [facility] Pulmonary  Rehabilitation.

## 2022-12-24 ENCOUNTER — Encounter: Payer: Medicare PPO | Admitting: *Deleted

## 2022-12-24 DIAGNOSIS — Z955 Presence of coronary angioplasty implant and graft: Secondary | ICD-10-CM

## 2022-12-24 DIAGNOSIS — Z5189 Encounter for other specified aftercare: Secondary | ICD-10-CM | POA: Diagnosis not present

## 2022-12-24 DIAGNOSIS — I213 ST elevation (STEMI) myocardial infarction of unspecified site: Secondary | ICD-10-CM

## 2022-12-24 LAB — GLUCOSE, CAPILLARY
Glucose-Capillary: 286 mg/dL — ABNORMAL HIGH (ref 70–99)
Glucose-Capillary: 234 mg/dL — ABNORMAL HIGH (ref 70–99)

## 2022-12-24 NOTE — Progress Notes (Signed)
Daily Session Note  Patient Details  Name: Vanessa Young MRN: [id number] Date of Birth: 01/30/1941 Referring Provider:   Flowsheet Row Cardiac Rehab from 12/12/2022 in [facility] Cardiac and Pulmonary Rehab  Referring Provider Kathlyn Sacramento MD       Encounter Date: 12/24/2022  Check In:  Session Check In - 12/24/22 1130       Check-In   Supervising physician immediately available to respond to emergencies See telemetry face sheet for immediately available ER MD    Location [facility]-Cardiac & Pulmonary Rehab    Staff Present Darlyne Russian, RN, Doyce Para, BS, ACSM CEP, Exercise Physiologist;Meredith Sherryll Burger, RN BSN;Noah Tickle, BS, Exercise Physiologist    Virtual Visit No    Medication changes reported     No    Fall or balance concerns reported    No    Warm-up and Cool-down Performed on first and last piece of equipment    Resistance Training Performed Yes    VAD Patient? No    PAD/SET Patient? No      Pain Assessment   Currently in Pain? No/denies                Social History   Tobacco Use  Smoking Status Former   Packs/day: 0.25   Years: 20.00   Total pack years: 5.00   Types: Cigarettes   Quit date: 2002   Years since quitting: 22.0  Smokeless Tobacco Never    Goals Met:  Independence with exercise equipment Exercise tolerated well No report of concerns or symptoms today Strength training completed today  Goals Unmet:  Not Applicable  Comments: Pt able to follow exercise prescription today without complaint.  Will continue to monitor for progression.    Dr. Emily Filbert is Medical Director for [place].  Dr. Ottie Glazier is Medical Director for [facility] Pulmonary Rehabilitation.

## 2022-12-26 ENCOUNTER — Encounter: Payer: Medicare PPO | Admitting: *Deleted

## 2022-12-26 DIAGNOSIS — I213 ST elevation (STEMI) myocardial infarction of unspecified site: Secondary | ICD-10-CM

## 2022-12-26 DIAGNOSIS — Z5189 Encounter for other specified aftercare: Secondary | ICD-10-CM | POA: Diagnosis not present

## 2022-12-26 DIAGNOSIS — Z955 Presence of coronary angioplasty implant and graft: Secondary | ICD-10-CM

## 2022-12-26 LAB — GLUCOSE, CAPILLARY
Glucose-Capillary: 208 mg/dL — ABNORMAL HIGH (ref 70–99)
Glucose-Capillary: 154 mg/dL — ABNORMAL HIGH (ref 70–99)

## 2022-12-26 NOTE — Progress Notes (Signed)
Daily Session Note  Patient Details  Name: Vanessa Young MRN: [id number] Date of Birth: Mar 01, 1941 Referring Provider:   Flowsheet Row Cardiac Rehab from 12/12/2022 in [facility] Cardiac and Pulmonary Rehab  Referring Provider Kathlyn Sacramento MD       Encounter Date: 12/26/2022  Check In:  Session Check In - 12/26/22 1114       Check-In   Supervising physician immediately available to respond to emergencies See telemetry face sheet for immediately available ER MD    Location [facility]-Cardiac & Pulmonary Rehab    Staff Present Darlyne Russian, RN, Dimple Nanas, BS, Exercise Physiologist;Joseph Tessie Fass, [place]    Virtual Visit No    Medication changes reported     No    Fall or balance concerns reported    No    Warm-up and Cool-down Performed on first and last piece of equipment    Resistance Training Performed Yes    VAD Patient? No    PAD/SET Patient? No      Pain Assessment   Currently in Pain? No/denies                Social History   Tobacco Use  Smoking Status Former   Packs/day: 0.25   Years: 20.00   Total pack years: 5.00   Types: Cigarettes   Quit date: 2002   Years since quitting: 22.0  Smokeless Tobacco Never    Goals Met:  Independence with exercise equipment Exercise tolerated well No report of concerns or symptoms today Strength training completed today  Goals Unmet:  Not Applicable  Comments: Pt able to follow exercise prescription today without complaint.  Will continue to monitor for progression.    Dr. Emily Filbert is Medical Director for [place].  Dr. Ottie Glazier is Medical Director for [facility] Pulmonary Rehabilitation.

## 2022-12-28 ENCOUNTER — Encounter: Payer: Medicare PPO | Admitting: *Deleted

## 2022-12-28 DIAGNOSIS — Z955 Presence of coronary angioplasty implant and graft: Secondary | ICD-10-CM

## 2022-12-28 DIAGNOSIS — I213 ST elevation (STEMI) myocardial infarction of unspecified site: Secondary | ICD-10-CM

## 2022-12-28 DIAGNOSIS — Z5189 Encounter for other specified aftercare: Secondary | ICD-10-CM | POA: Diagnosis not present

## 2022-12-28 NOTE — Progress Notes (Signed)
Daily Session Note  Patient Details  Name: DASHAYLA THEISSEN MRN: [id number] Date of Birth: 01/02/1941 Referring Provider:   Flowsheet Row Cardiac Rehab from 12/12/2022 in [facility] Cardiac and Pulmonary Rehab  Referring Provider Kathlyn Sacramento MD       Encounter Date: 12/28/2022  Check In:  Session Check In - 12/28/22 1147       Check-In   Supervising physician immediately available to respond to emergencies See telemetry face sheet for immediately available ER MD    Location [facility]-Cardiac & Pulmonary Rehab    Staff Present Heath Lark, RN, BSN, CCRP;Jessica [place], MA, RCEP, CCRP, CCET;Joseph [place], [place]    Virtual Visit No    Medication changes reported     No    Fall or balance concerns reported    No    Warm-up and Cool-down Performed on first and last piece of equipment    Resistance Training Performed Yes    VAD Patient? No    PAD/SET Patient? No      Pain Assessment   Currently in Pain? No/denies                Social History   Tobacco Use  Smoking Status Former   Packs/day: 0.25   Years: 20.00   Total pack years: 5.00   Types: Cigarettes   Quit date: 2002   Years since quitting: 22.0  Smokeless Tobacco Never    Goals Met:  Independence with exercise equipment Exercise tolerated well No report of concerns or symptoms today  Goals Unmet:  Not Applicable  Comments: Pt able to follow exercise prescription today without complaint.  Will continue to monitor for progression.    Dr. Emily Filbert is Medical Director for [place].  Dr. Ottie Glazier is Medical Director for [facility] Pulmonary Rehabilitation.

## 2022-12-31 ENCOUNTER — Encounter: Payer: Medicare PPO | Admitting: *Deleted

## 2022-12-31 DIAGNOSIS — Z955 Presence of coronary angioplasty implant and graft: Secondary | ICD-10-CM

## 2022-12-31 DIAGNOSIS — Z5189 Encounter for other specified aftercare: Secondary | ICD-10-CM | POA: Diagnosis not present

## 2022-12-31 DIAGNOSIS — I213 ST elevation (STEMI) myocardial infarction of unspecified site: Secondary | ICD-10-CM

## 2022-12-31 NOTE — Progress Notes (Signed)
Daily Session Note  Patient Details  Name: Vanessa Young MRN: [id number] Date of Birth: 05/27/1941 Referring Provider:   Flowsheet Row Cardiac Rehab from 12/12/2022 in [facility] Cardiac and Pulmonary Rehab  Referring Provider Kathlyn Sacramento MD       Encounter Date: 12/31/2022  Check In:  Session Check In - 12/31/22 1122       Check-In   Supervising physician immediately available to respond to emergencies See telemetry face sheet for immediately available ER MD    Location [facility]-Cardiac & Pulmonary Rehab    Staff Present Renita Papa, RN Moises Blood, BS, ACSM CEP, Exercise Physiologist;Geanie Pacifico Tamala Julian, RN, Dimple Nanas, BS, Exercise Physiologist    Virtual Visit No    Medication changes reported     No    Fall or balance concerns reported    No    Warm-up and Cool-down Performed on first and last piece of equipment    Resistance Training Performed Yes    VAD Patient? No    PAD/SET Patient? No      Pain Assessment   Currently in Pain? No/denies                Social History   Tobacco Use  Smoking Status Former   Packs/day: 0.25   Years: 20.00   Total pack years: 5.00   Types: Cigarettes   Quit date: 2002   Years since quitting: 22.0  Smokeless Tobacco Never    Goals Met:  Independence with exercise equipment Exercise tolerated well No report of concerns or symptoms today Strength training completed today  Goals Unmet:  Not Applicable  Comments: Pt able to follow exercise prescription today without complaint.  Will continue to monitor for progression.    Dr. Emily Filbert is Medical Director for [place].  Dr. Ottie Glazier is Medical Director for [facility] Pulmonary Rehabilitation.

## 2023-01-02 ENCOUNTER — Encounter: Payer: Medicare PPO | Admitting: *Deleted

## 2023-01-02 DIAGNOSIS — Z955 Presence of coronary angioplasty implant and graft: Secondary | ICD-10-CM

## 2023-01-02 DIAGNOSIS — Z5189 Encounter for other specified aftercare: Secondary | ICD-10-CM | POA: Diagnosis not present

## 2023-01-02 DIAGNOSIS — I213 ST elevation (STEMI) myocardial infarction of unspecified site: Secondary | ICD-10-CM

## 2023-01-02 NOTE — Progress Notes (Signed)
Daily Session Note  Patient Details  Name: Vanessa Young MRN: [id number] Date of Birth: 05-16-1941 Referring Provider:   Flowsheet Row Cardiac Rehab from 12/12/2022 in [facility] Cardiac and Pulmonary Rehab  Referring Provider Kathlyn Sacramento MD       Encounter Date: 01/02/2023  Check In:  Session Check In - 01/02/23 1105       Check-In   Supervising physician immediately available to respond to emergencies See telemetry face sheet for immediately available ER MD    Location [facility]-Cardiac & Pulmonary Rehab    Staff Present Darlyne Russian, RN, Dimple Nanas, BS, Exercise Physiologist;Joseph Tessie Fass, Cristopher Estimable, RN BSN    Virtual Visit No    Medication changes reported     No    Fall or balance concerns reported    No    Warm-up and Cool-down Performed on first and last piece of equipment    Resistance Training Performed Yes    VAD Patient? No    PAD/SET Patient? No      Pain Assessment   Currently in Pain? No/denies                Social History   Tobacco Use  Smoking Status Former   Packs/day: 0.25   Years: 20.00   Total pack years: 5.00   Types: Cigarettes   Quit date: 2002   Years since quitting: 22.0  Smokeless Tobacco Never    Goals Met:  Independence with exercise equipment Exercise tolerated well No report of concerns or symptoms today Strength training completed today  Goals Unmet:  Not Applicable  Comments: Pt able to follow exercise prescription today without complaint.  Will continue to monitor for progression.    Dr. Emily Filbert is Medical Director for [place].  Dr. Ottie Glazier is Medical Director for [facility] Pulmonary Rehabilitation.

## 2023-01-04 ENCOUNTER — Encounter: Payer: Medicare PPO | Admitting: *Deleted

## 2023-01-04 DIAGNOSIS — Z955 Presence of coronary angioplasty implant and graft: Secondary | ICD-10-CM

## 2023-01-04 DIAGNOSIS — Z5189 Encounter for other specified aftercare: Secondary | ICD-10-CM | POA: Diagnosis not present

## 2023-01-04 DIAGNOSIS — I213 ST elevation (STEMI) myocardial infarction of unspecified site: Secondary | ICD-10-CM

## 2023-01-04 NOTE — Progress Notes (Signed)
Daily Session Note  Patient Details  Name: Vanessa Young MRN: [id number] Date of Birth: [birth year] Referring Provider:   Flowsheet Row Cardiac Rehab from 12/12/2022 in [facility] Cardiac and Pulmonary Rehab  Referring Provider Kathlyn Sacramento MD       Encounter Date: 01/04/2023  Check In:  Session Check In - 01/04/23 1147       Check-In   Supervising physician immediately available to respond to emergencies See telemetry face sheet for immediately available ER MD    Location [facility]-Cardiac & Pulmonary Rehab    Staff Present Heath Lark, RN, BSN, CCRP;Marvell Fuller, PhD, RN, CNS, CEN;Joseph Tessie Fass, [place]    Virtual Visit No    Medication changes reported     No    Fall or balance concerns reported    No    Warm-up and Cool-down Performed on first and last piece of equipment    Resistance Training Performed Yes    VAD Patient? No    PAD/SET Patient? No      Pain Assessment   Currently in Pain? No/denies                Social History   Tobacco Use  Smoking Status Former   Packs/day: 0.25   Years: 20.00   Total pack years: 5.00   Types: Cigarettes   Quit date: 2002   Years since quitting: 22.0  Smokeless Tobacco Never    Goals Met:  Independence with exercise equipment Exercise tolerated well No report of concerns or symptoms today  Goals Unmet:  Not Applicable  Comments: Pt able to follow exercise prescription today without complaint.  Will continue to monitor for progression.    Dr. Emily Filbert is Medical Director for [place].  Dr. Ottie Glazier is Medical Director for [facility] Pulmonary Rehabilitation.

## 2023-01-07 ENCOUNTER — Encounter: Payer: Medicare PPO | Admitting: *Deleted

## 2023-01-07 DIAGNOSIS — I213 ST elevation (STEMI) myocardial infarction of unspecified site: Secondary | ICD-10-CM

## 2023-01-07 DIAGNOSIS — Z955 Presence of coronary angioplasty implant and graft: Secondary | ICD-10-CM

## 2023-01-07 DIAGNOSIS — Z5189 Encounter for other specified aftercare: Secondary | ICD-10-CM | POA: Diagnosis not present

## 2023-01-07 NOTE — Progress Notes (Signed)
Daily Session Note  Patient Details  Name: Vanessa Young MRN: [id number] Date of Birth: [birth year] Referring Provider:   Flowsheet Row Cardiac Rehab from 12/12/2022 in [facility] Cardiac and Pulmonary Rehab  Referring Provider Kathlyn Sacramento MD       Encounter Date: 01/07/2023  Check In:  Session Check In - 01/07/23 1115       Check-In   Supervising physician immediately available to respond to emergencies See telemetry face sheet for immediately available ER MD    Location [facility]-Cardiac & Pulmonary Rehab    Staff Present Darlyne Russian, RN, Doyce Para, BS, ACSM CEP, Exercise Physiologist;Meredith Sherryll Burger, RN BSN;Noah Tickle, BS, Exercise Physiologist    Virtual Visit No    Medication changes reported     No    Fall or balance concerns reported    No    Warm-up and Cool-down Performed on first and last piece of equipment    Resistance Training Performed Yes    VAD Patient? No    PAD/SET Patient? No      Pain Assessment   Currently in Pain? No/denies                Social History   Tobacco Use  Smoking Status Former   Packs/day: 0.25   Years: 20.00   Total pack years: 5.00   Types: Cigarettes   Quit date: 2002   Years since quitting: 22.0  Smokeless Tobacco Never    Goals Met:  Independence with exercise equipment Exercise tolerated well No report of concerns or symptoms today Strength training completed today  Goals Unmet:  Not Applicable  Comments: Pt able to follow exercise prescription today without complaint.  Will continue to monitor for progression.    Dr. Emily Filbert is Medical Director for [place].  Dr. Ottie Glazier is Medical Director for [facility] Pulmonary Rehabilitation.

## 2023-01-09 ENCOUNTER — Encounter: Payer: Self-pay | Admitting: *Deleted

## 2023-01-09 ENCOUNTER — Encounter: Payer: Medicare PPO | Admitting: *Deleted

## 2023-01-09 DIAGNOSIS — Z5189 Encounter for other specified aftercare: Secondary | ICD-10-CM | POA: Diagnosis not present

## 2023-01-09 DIAGNOSIS — I213 ST elevation (STEMI) myocardial infarction of unspecified site: Secondary | ICD-10-CM

## 2023-01-09 DIAGNOSIS — Z955 Presence of coronary angioplasty implant and graft: Secondary | ICD-10-CM

## 2023-01-09 NOTE — Progress Notes (Signed)
Cardiac Individual Treatment Plan  Patient Details  Name: Vanessa Young MRN: [id number] Date of Birth: 09-21-1941 Referring Provider:   Flowsheet Row Cardiac Rehab from 12/12/2022 in [facility] Cardiac and Pulmonary Rehab  Referring Provider Kathlyn Sacramento MD       Initial Encounter Date:  Flowsheet Row Cardiac Rehab from 12/12/2022 in [facility] Cardiac and Pulmonary Rehab  Date 12/12/22       Visit Diagnosis: ST elevation myocardial infarction (STEMI), unspecified artery ([place])  Status post coronary artery stent placement  Patient's Home Medications on Admission:  Current Outpatient Medications:    ACCU-CHEK GUIDE test strip, , Disp: , Rfl:    ALPRAZolam (XANAX) 0.25 MG tablet, Take 0.25 mg by mouth at bedtime as needed for sleep., Disp: , Rfl:    amLODipine (NORVASC) 5 MG tablet, Take 1 tablet (5 mg total) by mouth daily., Disp: 30 tablet, Rfl: 2   apixaban (ELIQUIS) 5 MG TABS tablet, Take 1 tablet (5 mg total) by mouth 2 (two) times daily., Disp: 60 tablet, Rfl: 2   carvedilol (COREG) 25 MG tablet, Take 1 tablet (25 mg total) by mouth 2 (two) times daily with a meal., Disp: 60 tablet, Rfl: 2   clopidogrel (PLAVIX) 75 MG tablet, Take 1 tablet (75 mg total) by mouth daily., Disp: 30 tablet, Rfl: 2   escitalopram (LEXAPRO) 10 MG tablet, Take 10 mg by mouth daily., Disp: , Rfl:    guanFACINE (TENEX) 1 MG tablet, Take 1 mg by mouth 2 (two) times daily. , Disp: , Rfl:    losartan (COZAAR) 100 MG tablet, Take 1 tablet (100 mg total) by mouth daily., Disp: 30 tablet, Rfl: 2   magnesium oxide (MAG-OX) 400 (240 Mg) MG tablet, Take 1 tablet by mouth daily., Disp: , Rfl:    metFORMIN (GLUMETZA) 500 MG (MOD) 24 hr tablet, Take 500 mg by mouth 2 (two) times daily with a meal., Disp: , Rfl:    pantoprazole (PROTONIX) 40 MG tablet, Take 1 tablet (40 mg total) by mouth daily., Disp: 30 tablet, Rfl: 1   rosuvastatin (CRESTOR) 40 MG tablet, Take 1 tablet (40 mg total) by mouth daily., Disp: 30 tablet, Rfl:  2  Past Medical History: Past Medical History:  Diagnosis Date   A-fib ([place])    Depression    Diabetes mellitus without complication ([place])    GERD (gastroesophageal reflux disease)    Hypercholesteremia    Hypertension     Tobacco Use: Social History   Tobacco Use  Smoking Status Former   Packs/day: 0.25   Years: 20.00   Total pack years: 5.00   Types: Cigarettes   Quit date: 2002   Years since quitting: 22.0  Smokeless Tobacco Never    Labs: Review Flowsheet       Latest Ref Rng & Units 11/19/2022 11/24/2022  Labs for ITP Cardiac and Pulmonary Rehab  Cholestrol 0 - 200 mg/dL 269  166   LDL (calc) 0 - 99 mg/dL UNABLE TO CALCULATE IF TRIGLYCERIDE OVER 400 mg/dL  91   Direct LDL 0 - 99 mg/dL 154  -  HDL-C >40 mg/dL 51  34   Trlycerides <150 mg/dL 477  205   Hemoglobin A1c 4.8 - 5.6 % 7.2  -     Exercise Target Goals: Exercise Program Goal: Individual exercise prescription set using results from initial 6 min walk test and THRR while considering  patient's activity barriers and safety.   Exercise Prescription Goal: Initial exercise prescription builds to 30-45 minutes a  day of aerobic activity, 2-3 days per week.  Home exercise guidelines will be given to patient during program as part of exercise prescription that the participant will acknowledge.   Education: Aerobic Exercise: - Group verbal and visual presentation on the components of exercise prescription. Introduces F.I.T.T principle from ACSM for exercise prescriptions.  Reviews F.I.T.T. principles of aerobic exercise including progression. Written material given at graduation. Flowsheet Row Cardiac Rehab from 01/02/2023 in [facility] Cardiac and Pulmonary Rehab  Education need identified 12/12/22  Date 01/02/23  Educator [facility]  Instruction Review Code 1- Verbalizes Understanding       Education: Resistance Exercise: - Group verbal and visual presentation on the components of exercise prescription. Introduces  F.I.T.T principle from ACSM for exercise prescriptions  Reviews F.I.T.T. principles of resistance exercise including progression. Written material given at graduation.    Education: Exercise & Equipment Safety: - Individual verbal instruction and demonstration of equipment use and safety with use of the equipment. Flowsheet Row Cardiac Rehab from 01/02/2023 in [facility] Cardiac and Pulmonary Rehab  Date 12/12/22  Educator NT  Instruction Review Code 1- Verbalizes Understanding       Education: Exercise Physiology & General Exercise Guidelines: - Group verbal and written instruction with models to review the exercise physiology of the cardiovascular system and associated critical values. Provides general exercise guidelines with specific guidelines to those with heart or lung disease.  Flowsheet Row Cardiac Rehab from 01/02/2023 in [facility] Cardiac and Pulmonary Rehab  Date 12/26/22  Educator [facility]  Instruction Review Code 1- Verbalizes Understanding       Education: Flexibility, Balance, Mind/Body Relaxation: - Group verbal and visual presentation with interactive activity on the components of exercise prescription. Introduces F.I.T.T principle from ACSM for exercise prescriptions. Reviews F.I.T.T. principles of flexibility and balance exercise training including progression. Also discusses the mind body connection.  Reviews various relaxation techniques to help reduce and manage stress (i.e. Deep breathing, progressive muscle relaxation, and visualization). Balance handout provided to take home. Written material given at graduation.   Activity Barriers & Risk Stratification:  Activity Barriers & Cardiac Risk Stratification - 12/12/22 1507       Activity Barriers & Cardiac Risk Stratification   Activity Barriers Balance Concerns;Muscular Weakness    Cardiac Risk Stratification High             6 Minute Walk:  6 Minute Walk     Row Name 12/12/22 1506         6 Minute Walk   Phase  Initial     Distance 955 feet     Walk Time 6 minutes     # of Rest Breaks 0     MPH 1.81     METS 1.39     RPE 12     Perceived Dyspnea  0     VO2 Peak 4.87     Symptoms Yes (comment)     Comments leg fatigue     Resting HR 55 bpm     Resting BP 124/66     Resting Oxygen Saturation  98 %     Exercise Oxygen Saturation  during 6 min walk 96 %     Max Ex. HR 55 bpm     Max Ex. BP 134/66     2 Minute Post BP 122/64              Oxygen Initial Assessment:   Oxygen Re-Evaluation:   Oxygen Discharge (Final Oxygen Re-Evaluation):   Initial Exercise Prescription:  Initial Exercise Prescription - 12/12/22 1500       Date of Initial Exercise RX and Referring Provider   Date 12/12/22    Referring Provider Kathlyn Sacramento MD      Oxygen   Maintain Oxygen Saturation 88% or higher      Treadmill   MPH 1.6    Grade 0    Minutes 15    METs 2.23      NuStep   Level 2    SPM 80    Minutes 1.39    METs 1.39      Biostep-RELP   Level 1    SPM 50    Minutes 15    METs 1.39      Track   Laps 18    Minutes 15    METs 1.98      Prescription Details   Frequency (times per week) 3    Duration Progress to 30 minutes of continuous aerobic without signs/symptoms of physical distress      Intensity   THRR 40-80% of Max Heartrate 88-121    Ratings of Perceived Exertion 11-13    Perceived Dyspnea 0-4      Progression   Progression Continue to progress workloads to maintain intensity without signs/symptoms of physical distress.      Resistance Training   Training Prescription Yes    Weight 2 lb    Reps 10-15             Perform Capillary Blood Glucose checks as needed.  Exercise Prescription Changes:   Exercise Prescription Changes     Row Name 12/12/22 1500 12/24/22 1400 01/07/23 1000         Response to Exercise   Blood Pressure (Admit) 124/66 122/62 92/60     Blood Pressure (Exercise) 134/66 132/60 100/58     Blood Pressure (Exit) 122/64  124/70 102/60     Heart Rate (Admit) 55 bpm 65 bpm 75 bpm     Heart Rate (Exercise) 93 bpm 99 bpm 101 bpm     Heart Rate (Exit) 56 bpm 63 bpm 79 bpm     Oxygen Saturation (Admit) 98 % -- --     Oxygen Saturation (Exercise) 96 % -- --     Rating of Perceived Exertion (Exercise) '12 13 13     '$ Perceived Dyspnea (Exercise) 0 -- --     Symptoms leg fatigue none none     Comments 6MWT Results 1st Full day of rehab --     Duration -- Progress to 30 minutes of  aerobic without signs/symptoms of physical distress Continue with 30 min of aerobic exercise without signs/symptoms of physical distress.     Intensity -- THRR unchanged THRR unchanged       Progression   Progression -- Continue to progress workloads to maintain intensity without signs/symptoms of physical distress. Continue to progress workloads to maintain intensity without signs/symptoms of physical distress.     Average METs -- 1.91 2.15       Resistance Training   Training Prescription -- Yes Yes     Weight -- 2 lb 2 lb     Reps -- 10-15 10-15       Interval Training   Interval Training -- No No       Treadmill   MPH -- -- 1.8     Grade -- -- 0     Minutes -- -- 15     METs -- -- 2.38  NuStep   Level -- -- 2     Minutes -- -- 15     METs -- -- 2.1       Biostep-RELP   Level -- 1 1     Minutes -- 15 15     METs -- 2 2       Track   Laps -- 15 23     Minutes -- 15 15     METs -- 1.82 2.25       Oxygen   Maintain Oxygen Saturation -- 88% or higher 88% or higher              Exercise Comments:   Exercise Comments     Row Name 12/21/22 1153           Exercise Comments First full day of exercise!  Patient was oriented to gym and equipment including functions, settings, policies, and procedures.  Patient's individual exercise prescription and treatment plan were reviewed.  All starting workloads were established based on the results of the 6 minute walk test done at initial orientation visit.  The plan  for exercise progression was also introduced and progression will be customized based on patient's performance and goals.                Exercise Goals and Review:   Exercise Goals     Row Name 12/12/22 1500             Exercise Goals   Increase Physical Activity Yes       Intervention Provide advice, education, support and counseling about physical activity/exercise needs.;Develop an individualized exercise prescription for aerobic and resistive training based on initial evaluation findings, risk stratification, comorbidities and participant's personal goals.       Expected Outcomes Short Term: Attend rehab on a regular basis to increase amount of physical activity.;Long Term: Add in home exercise to make exercise part of routine and to increase amount of physical activity.;Long Term: Exercising regularly at least 3-5 days a week.       Increase Strength and Stamina Yes       Intervention Provide advice, education, support and counseling about physical activity/exercise needs.;Develop an individualized exercise prescription for aerobic and resistive training based on initial evaluation findings, risk stratification, comorbidities and participant's personal goals.       Expected Outcomes Short Term: Increase workloads from initial exercise prescription for resistance, speed, and METs.;Short Term: Perform resistance training exercises routinely during rehab and add in resistance training at home;Long Term: Improve cardiorespiratory fitness, muscular endurance and strength as measured by increased METs and functional capacity (6MWT)       Able to understand and use rate of perceived exertion (RPE) scale Yes       Intervention Provide education and explanation on how to use RPE scale       Expected Outcomes Short Term: Able to use RPE daily in rehab to express subjective intensity level;Long Term:  Able to use RPE to guide intensity level when exercising independently       Able to  understand and use Dyspnea scale Yes       Intervention Provide education and explanation on how to use Dyspnea scale       Expected Outcomes Short Term: Able to use Dyspnea scale daily in rehab to express subjective sense of shortness of breath during exertion;Long Term: Able to use Dyspnea scale to guide intensity level when exercising independently       Knowledge  and understanding of Target Heart Rate Range (THRR) Yes       Intervention Provide education and explanation of THRR including how the numbers were predicted and where they are located for reference       Expected Outcomes Short Term: Able to state/look up THRR;Long Term: Able to use THRR to govern intensity when exercising independently;Short Term: Able to use daily as guideline for intensity in rehab       Able to check pulse independently Yes       Intervention Provide education and demonstration on how to check pulse in carotid and radial arteries.;Review the importance of being able to check your own pulse for safety during independent exercise       Expected Outcomes Short Term: Able to explain why pulse checking is important during independent exercise;Long Term: Able to check pulse independently and accurately       Understanding of Exercise Prescription Yes       Intervention Provide education, explanation, and written materials on patient's individual exercise prescription       Expected Outcomes Short Term: Able to explain program exercise prescription;Long Term: Able to explain home exercise prescription to exercise independently                Exercise Goals Re-Evaluation :  Exercise Goals Re-Evaluation     Row Name 12/21/22 1153 12/24/22 1415 12/28/22 1116 01/07/23 1040       Exercise Goal Re-Evaluation   Exercise Goals Review Able to understand and use rate of perceived exertion (RPE) scale;Knowledge and understanding of Target Heart Rate Range (THRR);Understanding of Exercise Prescription Understanding of  Exercise Prescription;Increase Strength and Stamina;Increase Physical Activity Understanding of Exercise Prescription;Increase Strength and Stamina;Increase Physical Activity Understanding of Exercise Prescription;Increase Strength and Stamina;Increase Physical Activity    Comments Reviewed RPE scale, THR and program prescription with pt today.  Pt voiced understanding and was given a copy of goals to take home. Vanessa Young is off to a good start in rehab. She had an average MET level of 1.91 METs during her first session in rehab. She also was able to walk 15 laps on the track. We will continue to monitor her progress in the program. Vanessa Young is doing well in rehab.  She is on her fourth visit and holding pretty good.  She is still getting a little winded when she is walking and talking.  She feels liks she is off to a good start and looks forward to coming each day.  She is also still doing some her PT exercises to help with strengthing as well. Vanessa Young continues to do well in rehab. She did increase her treadmill to a speed of 1.8 mph and was able to hit 23 laps on the track which has been her highest yet! She would benefit from increasing to level 2 on the Biostep as she has been consistent at level 1 for some time now. She continues to reach her THR each session. Will continue to monitor.    Expected Outcomes Short: Use RPE daily to regulate intensity. Long: Follow program prescription in THR. Short: Continue to attend cardiac rehab. Long: Continue to follow exercise prescription. Short: continue to attend regularly Long: conitnue to follow exercise prescription,. Short: Increase to level 2 on the Biostep Long: Continue to increase overall stamina and MET level             Discharge Exercise Prescription (Final Exercise Prescription Changes):  Exercise Prescription Changes - 01/07/23 1000  Response to Exercise   Blood Pressure (Admit) 92/60    Blood Pressure (Exercise) 100/58    Blood Pressure (Exit)  102/60    Heart Rate (Admit) 75 bpm    Heart Rate (Exercise) 101 bpm    Heart Rate (Exit) 79 bpm    Rating of Perceived Exertion (Exercise) 13    Symptoms none    Duration Continue with 30 min of aerobic exercise without signs/symptoms of physical distress.    Intensity THRR unchanged      Progression   Progression Continue to progress workloads to maintain intensity without signs/symptoms of physical distress.    Average METs 2.15      Resistance Training   Training Prescription Yes    Weight 2 lb    Reps 10-15      Interval Training   Interval Training No      Treadmill   MPH 1.8    Grade 0    Minutes 15    METs 2.38      NuStep   Level 2    Minutes 15    METs 2.1      Biostep-RELP   Level 1    Minutes 15    METs 2      Track   Laps 23    Minutes 15    METs 2.25      Oxygen   Maintain Oxygen Saturation 88% or higher             Nutrition:  Target Goals: Understanding of nutrition guidelines, daily intake of sodium '1500mg'$ , cholesterol '200mg'$ , calories 30% from fat and 7% or less from saturated fats, daily to have 5 or more servings of fruits and vegetables.  Education: All About Nutrition: -Group instruction provided by verbal, written material, interactive activities, discussions, models, and posters to present general guidelines for heart healthy nutrition including fat, fiber, MyPlate, the role of sodium in heart healthy nutrition, utilization of the nutrition label, and utilization of this knowledge for meal planning. Follow up email sent as well. Written material given at graduation.   Biometrics:  Pre Biometrics - 12/12/22 1500       Pre Biometrics   Height 4' 10.5" (1.486 m)    Weight 136 lb 12.8 oz (62.1 kg)    Waist Circumference 37.5 inches    Hip Circumference 41 inches    Waist to Hip Ratio 0.91 %    BMI (Calculated) 28.1    Single Leg Stand 1.9 seconds              Nutrition Therapy Plan and Nutrition Goals:  Nutrition  Therapy & Goals - 12/12/22 1458       Intervention Plan   Intervention Prescribe, educate and counsel regarding individualized specific dietary modifications aiming towards targeted core components such as weight, hypertension, lipid management, diabetes, heart failure and other comorbidities.    Expected Outcomes Short Term Goal: A plan has been developed with personal nutrition goals set during dietitian appointment.;Short Term Goal: Understand basic principles of dietary content, such as calories, fat, sodium, cholesterol and nutrients.;Long Term Goal: Adherence to prescribed nutrition plan.             Nutrition Assessments:  MEDIFICTS Score Key: ?70 Need to make dietary changes  40-70 Heart Healthy Diet ? 40 Therapeutic Level Cholesterol Diet  Flowsheet Row Cardiac Rehab from 12/12/2022 in [facility] Cardiac and Pulmonary Rehab  Picture Your Plate Total Score on Admission 61      Picture Your  Plate Scores: <36 Unhealthy dietary pattern with much room for improvement. 41-50 Dietary pattern unlikely to meet recommendations for good health and room for improvement. 51-60 More healthful dietary pattern, with some room for improvement.  >60 Healthy dietary pattern, although there may be some specific behaviors that could be improved.    Nutrition Goals Re-Evaluation:  Nutrition Goals Re-Evaluation     Vanessa Young Name 12/28/22 1128             Goals   Nutrition Goal Heart Healthy diet       Comment Vanessa Young started while dietitian was out on leave.  She has already started to make some changes to her diet.  She has cut back on carbs and sugar.  She never adds salt and buys low sodium.  Her biggest weakness is her tomato juice daily.  She is working on cutting back.       Expected Outcome Short; Continue to reduce sugar and salt Long: Set up appt with dietitian                Nutrition Goals Discharge (Final Nutrition Goals Re-Evaluation):  Nutrition Goals Re-Evaluation - 12/28/22  1128       Goals   Nutrition Goal Heart Healthy diet    Comment Vanessa Young started while dietitian was out on leave.  She has already started to make some changes to her diet.  She has cut back on carbs and sugar.  She never adds salt and buys low sodium.  Her biggest weakness is her tomato juice daily.  She is working on cutting back.    Expected Outcome Short; Continue to reduce sugar and salt Long: Set up appt with dietitian             Psychosocial: Target Goals: Acknowledge presence or absence of significant depression and/or stress, maximize coping skills, provide positive support system. Participant is able to verbalize types and ability to use techniques and skills needed for reducing stress and depression.   Education: Stress, Anxiety, and Depression - Group verbal and visual presentation to define topics covered.  Reviews how body is impacted by stress, anxiety, and depression.  Also discusses healthy ways to reduce stress and to treat/manage anxiety and depression.  Written material given at graduation.   Education: Sleep Hygiene -Provides group verbal and written instruction about how sleep can affect your health.  Define sleep hygiene, discuss sleep cycles and impact of sleep habits. Review good sleep hygiene tips.    Initial Review & Psychosocial Screening:  Initial Psych Review & Screening - 11/28/22 1016       Initial Review   Current issues with Current Anxiety/Panic;Current Stress Concerns;History of Depression;Current Psychotropic Meds    Source of Stress Concerns Unable to participate in former interests or hobbies      [place]? Yes   daughter; church family     Barriers   Psychosocial barriers to participate in program There are no identifiable barriers or psychosocial needs.;The patient should benefit from training in stress management and relaxation.      Screening Interventions   Interventions Provide feedback about the scores to  participant;Encouraged to exercise;To provide support and resources with identified psychosocial needs    Expected Outcomes Short Term goal: Utilizing psychosocial counselor, staff and physician to assist with identification of specific Stressors or current issues interfering with healing process. Setting desired goal for each stressor or current issue identified.;Long Term Goal: Stressors or current issues are controlled  or eliminated.;Short Term goal: Identification and review with participant of any Quality of Life or Depression concerns found by scoring the questionnaire.;Long Term goal: The participant improves quality of Life and PHQ9 Scores as seen by post scores and/or verbalization of changes             Quality of Life Scores:   Quality of Life - 12/12/22 1457       Quality of Life   Select Quality of Life      Quality of Life Scores   Health/Function Pre 21.8 %    Socioeconomic Pre 24.38 %    Psych/Spiritual Pre 30 %    Family Pre 27 %    GLOBAL Pre 24.77 %            Scores of 19 and below usually indicate a poorer quality of life in these areas.  A difference of  2-3 points is a clinically meaningful difference.  A difference of 2-3 points in the total score of the Quality of Life Index has been associated with significant improvement in overall quality of life, self-image, physical symptoms, and general health in studies assessing change in quality of life.  PHQ-9: Review Flowsheet       12/12/2022 05/30/2017  Depression screen PHQ 2/9  Decreased Interest 1 0  Down, Depressed, Hopeless 1 0  PHQ - 2 Score 2 0  Altered sleeping 0 -  Tired, decreased energy 3 -  Change in appetite 1 -  Feeling bad or failure about yourself  1 -  Trouble concentrating 1 -  Moving slowly or fidgety/restless 1 -  Suicidal thoughts 0 -  PHQ-9 Score 9 -  Difficult doing work/chores Not difficult at all -   Interpretation of Total Score  Total Score Depression Severity:  1-4 =  Minimal depression, 5-9 = Mild depression, 10-14 = Moderate depression, 15-19 = Moderately severe depression, 20-27 = Severe depression   Psychosocial Evaluation and Intervention:  Psychosocial Evaluation - 11/28/22 1026       Psychosocial Evaluation & Interventions   Interventions Encouraged to exercise with the program and follow exercise prescription    Comments Vanessa Young is coming to cardiac rehab after a MI and stent. A few days after being discharged, she was readmitted with mini strokes. She states she has recovered fine and has no deficits. She has a great support system of her daughter and church family that help out whenever she needs it. She is sleeping well with her CPAP. She is on Lexapro after starting it for depression after her husbadn passed years ago. She said she felt better all around on it and has chosen to stay on it. When asked about stress, she mentioned she tries not to stress about much. She is trying to make sure she gets her health under control and is ready to start the program.    Expected Outcomes Short: attend cardiac rehab for education and exercise. Long: develop and maintain positive self care habits    Continue Psychosocial Services  Follow up required by staff             Psychosocial Re-Evaluation:  Psychosocial Re-Evaluation     [place] Name 12/28/22 1117             Psychosocial Re-Evaluation   Current issues with Current Stress Concerns       Comments Vanessa Young is doing well in rehab.  She helps serve with [employer].  She is worried about a possible  gout flare up in her big toe.  She continues to work on changing eating habits.  She usually sleeps well and it has gotten better since she has started to exercise more.       Expected Outcomes Short: Conitnue to exercise for mental boost and sleep Long: conitnue to stay positive       Interventions Encouraged to attend Cardiac Rehabilitation for the exercise;Stress management education       Continue  Psychosocial Services  Follow up required by staff                Psychosocial Discharge (Final Psychosocial Re-Evaluation):  Psychosocial Re-Evaluation - 12/28/22 1117       Psychosocial Re-Evaluation   Current issues with Current Stress Concerns    Comments Vanessa Young is doing well in rehab.  She helps serve with [employer].  She is worried about a possible gout flare up in her big toe.  She continues to work on changing eating habits.  She usually sleeps well and it has gotten better since she has started to exercise more.    Expected Outcomes Short: Conitnue to exercise for mental boost and sleep Long: conitnue to stay positive    Interventions Encouraged to attend Cardiac Rehabilitation for the exercise;Stress management education    Continue Psychosocial Services  Follow up required by staff             Vocational Rehabilitation: Provide vocational rehab assistance to qualifying candidates.   Vocational Rehab Evaluation & Intervention:  Vocational Rehab - 11/28/22 1016       Initial Vocational Rehab Evaluation & Intervention   Assessment shows need for Vocational Rehabilitation No             Education: Education Goals: Education classes will be provided on a variety of topics geared toward better understanding of heart health and risk factor modification. Participant will state understanding/return demonstration of topics presented as noted by education test scores.  Learning Barriers/Preferences:  Learning Barriers/Preferences - 11/28/22 1016       Learning Barriers/Preferences   Learning Barriers None    Learning Preferences Individual Instruction             General Cardiac Education Topics:  AED/CPR: - Group verbal and written instruction with the use of models to demonstrate the basic use of the AED with the basic ABC's of resuscitation.   Anatomy and Cardiac Procedures: - Group verbal and visual presentation and models provide information  about basic cardiac anatomy and function. Reviews the testing methods done to diagnose heart disease and the outcomes of the test results. Describes the treatment choices: Medical Management, Angioplasty, or Coronary Bypass Surgery for treating various heart conditions including Myocardial Infarction, Angina, Valve Disease, and Cardiac Arrhythmias.  Written material given at graduation. Flowsheet Row Cardiac Rehab from 01/02/2023 in [facility] Cardiac and Pulmonary Rehab  Education need identified 12/12/22       Medication Safety: - Group verbal and visual instruction to review commonly prescribed medications for heart and lung disease. Reviews the medication, class of the drug, and side effects. Includes the steps to properly store meds and maintain the prescription regimen.  Written material given at graduation.   Intimacy: - Group verbal instruction through game format to discuss how heart and lung disease can affect sexual intimacy. Written material given at graduation.[facility] Medal Row Cardiac Rehab from 01/02/2023 in [facility] Cardiac and Pulmonary Rehab  Date 01/02/23  Educator [employer]  Instruction Review Code 1- Verbalizes Understanding  Know Your Numbers and Heart Failure: - Group verbal and visual instruction to discuss disease risk factors for cardiac and pulmonary disease and treatment options.  Reviews associated critical values for Overweight/Obesity, Hypertension, Cholesterol, and Diabetes.  Discusses basics of heart failure: signs/symptoms and treatments.  Introduces Heart Failure Zone chart for action plan for heart failure.  Written material given at graduation.   Infection Prevention: - Provides verbal and written material to individual with discussion of infection control including proper hand washing and proper equipment cleaning during exercise session. Flowsheet Row Cardiac Rehab from 01/02/2023 in [facility] Cardiac and Pulmonary Rehab  Date 12/12/22  Educator NT  Instruction Review  Code 1- Verbalizes Understanding       Falls Prevention: - Provides verbal and written material to individual with discussion of falls prevention and safety. Flowsheet Row Cardiac Rehab from 01/02/2023 in [facility] Cardiac and Pulmonary Rehab  Date 12/12/22  Educator NT  Instruction Review Code 1- Verbalizes Understanding       Other: -Provides group and verbal instruction on various topics (see comments)   Knowledge Questionnaire Score:  Knowledge Questionnaire Score - 12/12/22 1459       Knowledge Questionnaire Score   Pre Score 22/26             Core Components/Risk Factors/Patient Goals at Admission:  Personal Goals and Risk Factors at Admission - 12/12/22 1459       Core Components/Risk Factors/Patient Goals on Admission   Diabetes Yes    Intervention Provide education about signs/symptoms and action to take for hypo/hyperglycemia.;Provide education about proper nutrition, including hydration, and aerobic/resistive exercise prescription along with prescribed medications to achieve blood glucose in normal ranges: Fasting glucose 65-99 mg/dL    Expected Outcomes Short Term: Participant verbalizes understanding of the signs/symptoms and immediate care of hyper/hypoglycemia, proper foot care and importance of medication, aerobic/resistive exercise and nutrition plan for blood glucose control.;Long Term: Attainment of HbA1C < 7%.    Hypertension Yes    Intervention Provide education on lifestyle modifcations including regular physical activity/exercise, weight management, moderate sodium restriction and increased consumption of fresh fruit, vegetables, and low fat dairy, alcohol moderation, and smoking cessation.;Monitor prescription use compliance.    Expected Outcomes Short Term: Continued assessment and intervention until BP is < 140/50m HG in hypertensive participants. < 130/84mHG in hypertensive participants with diabetes, heart failure or chronic kidney disease.;Long  Term: Maintenance of blood pressure at goal levels.    Lipids Yes    Intervention Provide education and support for participant on nutrition & aerobic/resistive exercise along with prescribed medications to achieve LDL '70mg'$ , HDL >'40mg'$ .    Expected Outcomes Short Term: Participant states understanding of desired cholesterol values and is compliant with medications prescribed. Participant is following exercise prescription and nutrition guidelines.;Long Term: Cholesterol controlled with medications as prescribed, with individualized exercise RX and with personalized nutrition plan. Value goals: LDL < '70mg'$ , HDL > 40 mg.             Education:Diabetes - Individual verbal and written instruction to review signs/symptoms of diabetes, desired ranges of glucose level fasting, after meals and with exercise. Acknowledge that pre and post exercise glucose checks will be done for 3 sessions at entry of program. [facility]  Date 11/28/22  [facility] Review Code 1- Verbalizes Understanding       Core Components/Risk Factors/Patient Goals Review:   Goals and Risk Factor Review  [place] Name 12/28/22 1130             Core Components/Risk Factors/Patient Goals Review   Personal Goals Review Weight Management/Obesity;Diabetes;Hypertension       Review Vanessa Young is off to a good start in rehab.  Her weight is already starting to come down some.  Her blood pressures are doing well and she checks them at home daily and keeps record for her doctor. She also checks her sugars twice a day at home. They have been doing okay but she knows they could be better.       Expected Outcomes Short: Conitnue to work on managing her blood sugars Long: conitnue to montior risk factors.                Core Components/Risk Factors/Patient Goals at Discharge (Final Review):   Goals and Risk Factor Review - 12/28/22 1130       Core  Components/Risk Factors/Patient Goals Review   Personal Goals Review Weight Management/Obesity;Diabetes;Hypertension    Review Vanessa Young is off to a good start in rehab.  Her weight is already starting to come down some.  Her blood pressures are doing well and she checks them at home daily and keeps record for her doctor. She also checks her sugars twice a day at home. They have been doing okay but she knows they could be better.    Expected Outcomes Short: Conitnue to work on managing her blood sugars Long: conitnue to montior risk factors.             ITP Comments:  ITP Comments     Row Name 11/28/22 1024 12/12/22 1456 12/21/22 1153 01/09/23 0855     ITP Comments Initial phone call completed. Diagnosis can be found in [facility] 12/4. EP Orientation scheduled for Wednesday 12/27 at 1:30. Completed 6MWT and gym orientation. Initial ITP created and sent for review to Dr. Emily Filbert, Medical Director. First full day of exercise!  Patient was oriented to gym and equipment including functions, settings, policies, and procedures.  Patient's individual exercise prescription and treatment plan were reviewed.  All starting workloads were established based on the results of the 6 minute walk test done at initial orientation visit.  The plan for exercise progression was also introduced and progression will be customized based on patient's performance and goals. 30 Day review completed. Medical Director ITP review done, changes made as directed, and signed approval by Medical Director.   new to program             Comments:

## 2023-01-09 NOTE — Progress Notes (Signed)
Daily Session Note  Patient Details  Name: Vanessa Young MRN: [id number] Date of Birth: 03-03-1941 Referring Provider:   Flowsheet Row Cardiac Rehab from 12/12/2022 in [facility] Cardiac and Pulmonary Rehab  Referring Provider Kathlyn Sacramento MD       Encounter Date: 01/09/2023  Check In:  Session Check In - 01/09/23 1109       Check-In   Supervising physician immediately available to respond to emergencies See telemetry face sheet for immediately available ER MD    Location [facility]-Cardiac & Pulmonary Rehab    Staff Present Darlyne Russian, RN, ADN;Meredith Sherryll Burger, RN BSN;Joseph Hood, RCP,RRT,BSRT;Noah Tickle, BS, Exercise Physiologist    Virtual Visit No    Medication changes reported     No    Fall or balance concerns reported    No    Warm-up and Cool-down Performed on first and last piece of equipment    Resistance Training Performed Yes    VAD Patient? No    PAD/SET Patient? No      Pain Assessment   Currently in Pain? No/denies                Social History   Tobacco Use  Smoking Status Former   Packs/day: 0.25   Years: 20.00   Total pack years: 5.00   Types: Cigarettes   Quit date: 2002   Years since quitting: 22.0  Smokeless Tobacco Never    Goals Met:  Independence with exercise equipment Exercise tolerated well No report of concerns or symptoms today Strength training completed today  Goals Unmet:  Not Applicable  Comments: Pt able to follow exercise prescription today without complaint.  Will continue to monitor for progression.    Dr. Emily Filbert is Medical Director for [place].  Dr. Ottie Glazier is Medical Director for [facility] Pulmonary Rehabilitation.

## 2023-01-11 ENCOUNTER — Encounter: Payer: Medicare PPO | Admitting: *Deleted

## 2023-01-11 DIAGNOSIS — I213 ST elevation (STEMI) myocardial infarction of unspecified site: Secondary | ICD-10-CM

## 2023-01-11 DIAGNOSIS — Z955 Presence of coronary angioplasty implant and graft: Secondary | ICD-10-CM

## 2023-01-11 DIAGNOSIS — Z5189 Encounter for other specified aftercare: Secondary | ICD-10-CM | POA: Diagnosis not present

## 2023-01-11 NOTE — Progress Notes (Signed)
Daily Session Note  Patient Details  Name: Vanessa Young MRN: [id number] Date of Birth: 1941-03-08 Referring Provider:   Flowsheet Row Cardiac Rehab from 12/12/2022 in [facility] Cardiac and Pulmonary Rehab  Referring Provider Kathlyn Sacramento MD       Encounter Date: 01/11/2023  Check In:  Session Check In - 01/11/23 1152       Check-In   Supervising physician immediately available to respond to emergencies See telemetry face sheet for immediately available ER MD    Location [facility]-Cardiac & Pulmonary Rehab    Staff Present Heath Lark, RN, BSN, CCRP;Jessica [place], MA, RCEP, CCRP, CCET;Joseph [place], [place]    Virtual Visit No    Medication changes reported     No    Fall or balance concerns reported    No    Warm-up and Cool-down Performed on first and last piece of equipment    Resistance Training Performed Yes    VAD Patient? No    PAD/SET Patient? No      Pain Assessment   Currently in Pain? No/denies                Social History   Tobacco Use  Smoking Status Former   Packs/day: 0.25   Years: 20.00   Total pack years: 5.00   Types: Cigarettes   Quit date: 2002   Years since quitting: 22.0  Smokeless Tobacco Never    Goals Met:  Independence with exercise equipment Exercise tolerated well No report of concerns or symptoms today  Goals Unmet:  Not Applicable  Comments: Pt able to follow exercise prescription today without complaint.  Will continue to monitor for progression.    Dr. Emily Filbert is Medical Director for [place].  Dr. Ottie Glazier is Medical Director for [facility] Pulmonary Rehabilitation.

## 2023-01-14 ENCOUNTER — Encounter: Payer: Medicare PPO | Admitting: *Deleted

## 2023-01-14 DIAGNOSIS — Z5189 Encounter for other specified aftercare: Secondary | ICD-10-CM | POA: Diagnosis not present

## 2023-01-14 DIAGNOSIS — I213 ST elevation (STEMI) myocardial infarction of unspecified site: Secondary | ICD-10-CM

## 2023-01-14 DIAGNOSIS — Z955 Presence of coronary angioplasty implant and graft: Secondary | ICD-10-CM

## 2023-01-14 NOTE — Progress Notes (Signed)
Daily Session Note  Patient Details  Name: Vanessa Young MRN: [id number] Date of Birth: 08/15/1941 Referring Provider:   Flowsheet Row Cardiac Rehab from 12/12/2022 in [facility] Cardiac and Pulmonary Rehab  Referring Provider Kathlyn Sacramento MD       Encounter Date: 01/14/2023  Check In:  Session Check In - 01/14/23 1128       Check-In   Supervising physician immediately available to respond to emergencies See telemetry face sheet for immediately available ER MD    Location [facility]-Cardiac & Pulmonary Rehab    Staff Present Earlean Shawl, BS, ACSM CEP, Exercise Physiologist;Crystale Giannattasio Tamala Julian, RN, ADN;Meredith Sherryll Burger, RN BSN;Noah Tickle, BS, Exercise Physiologist    Virtual Visit No    Medication changes reported     No    Fall or balance concerns reported    No    Warm-up and Cool-down Performed on first and last piece of equipment    Resistance Training Performed Yes    VAD Patient? No    PAD/SET Patient? No      Pain Assessment   Currently in Pain? No/denies                Social History   Tobacco Use  Smoking Status Former   Packs/day: 0.25   Years: 20.00   Total pack years: 5.00   Types: Cigarettes   Quit date: 2002   Years since quitting: 22.0  Smokeless Tobacco Never    Goals Met:  Independence with exercise equipment Exercise tolerated well No report of concerns or symptoms today Strength training completed today  Goals Unmet:  Not Applicable  Comments: Pt able to follow exercise prescription today without complaint.  Will continue to monitor for progression.    Dr. Emily Filbert is Medical Director for [place].  Dr. Ottie Glazier is Medical Director for [facility] Pulmonary Rehabilitation.

## 2023-01-16 ENCOUNTER — Encounter: Payer: Medicare PPO | Admitting: *Deleted

## 2023-01-16 DIAGNOSIS — Z955 Presence of coronary angioplasty implant and graft: Secondary | ICD-10-CM

## 2023-01-16 DIAGNOSIS — I213 ST elevation (STEMI) myocardial infarction of unspecified site: Secondary | ICD-10-CM

## 2023-01-16 DIAGNOSIS — Z5189 Encounter for other specified aftercare: Secondary | ICD-10-CM | POA: Diagnosis not present

## 2023-01-16 NOTE — Progress Notes (Signed)
Daily Session Note  Patient Details  Name: Vanessa Young MRN: [id number] Date of Birth: [birth year] Referring Provider:   Flowsheet Row Cardiac Rehab from 12/12/2022 in [facility] Cardiac and Pulmonary Rehab  Referring Provider Kathlyn Sacramento MD       Encounter Date: 01/16/2023  Check In:  Session Check In - 01/16/23 1134       Check-In   Supervising physician immediately available to respond to emergencies See telemetry face sheet for immediately available ER MD    Location [facility]-Cardiac & Pulmonary Rehab    Staff Present Darlyne Russian, RN, ADN;Meredith Sherryll Burger, RN Abel Presto, MS, ACSM CEP, Exercise Physiologist;Noah Tickle, BS, Exercise Physiologist    Virtual Visit No    Medication changes reported     No    Fall or balance concerns reported    No    Warm-up and Cool-down Performed on first and last piece of equipment    Resistance Training Performed Yes    VAD Patient? No    PAD/SET Patient? No      Pain Assessment   Currently in Pain? No/denies                Social History   Tobacco Use  Smoking Status Former   Packs/day: 0.25   Years: 20.00   Total pack years: 5.00   Types: Cigarettes   Quit date: 2002   Years since quitting: 22.0  Smokeless Tobacco Never    Goals Met:  Independence with exercise equipment Exercise tolerated well No report of concerns or symptoms today Strength training completed today  Goals Unmet:  Not Applicable  Comments: Pt able to follow exercise prescription today without complaint.  Will continue to monitor for progression.    Dr. Emily Filbert is Medical Director for [place].  Dr. Ottie Glazier is Medical Director for [facility] Pulmonary Rehabilitation.

## 2023-01-16 NOTE — Progress Notes (Unsigned)
Cardiology Office Note    Date:  01/22/2023   ID:  Vanessa Young, DOB September 16, 1941, MRN [id number]  PCP:  Myrtie Soman, MD  Cardiologist:  Kathlyn Sacramento, MD  Electrophysiologist:  None   Chief Complaint: Follow-up  History of Present Illness:   Vanessa Young is a 82 y.o. female with history of CAD with inferior STEMI in 11/2022, PAF on apixaban, CVA, DM2, HTN, and HLD who presents for follow up of CAD and Afib.   She was previously followed by Drs. Barbette Merino, with [facility], for A-fib with transition of her care to our office during admission in 11/2022.   Prior to her admission in 11/2022, she had no known history of ischemic heart disease.  She was seen in the ED on 11/18/2022 with generalized weakness and headache and felt to be volume depleted with symptomatic improvement following IV fluids.  Following discharge, she began to have substernal chest pain described as a burning sensation across the whole chest radiating into the bilateral arms.  She slept and felt better, though was woken up with substernal chest tightness and heartburn.  EMS was called with EKG in the field showing evidence of an inferior ST elevation MI leading to activation of code STEMI.  Upon cardiology evaluation, she was still having angina.  She reported that she did not take Xarelto daily, instead took this 3 times weekly.  Emergent LHC showed severe three-vessel CAD with the culprit lesion being a subtotal occlusion of the mid RCA.  In addition, there was an occluded OM1 with collaterals and diffuse subocclusive disease throughout the mid and distal LAD.  Moderately reduced LV systolic function estimated at 35 to 45% with severely elevated LVEDP at 35 mmHg.  She underwent successful PCI/DES to the mid RCA.  It was felt the LAD was not optimal for PCI given long diffuse disease with medical therapy favored.  Post intervention echo during the admission showed an EF of 60 to 65%, mild hypokinesis of the basal mid  inferior wall and inferolateral wall, mild LVH, grade 1 diastolic dysfunction, normal RV systolic function and ventricular cavity size, and trivial aortic insufficiency.  High-sensitivity troponin peaked at 3612.  From a cardiac perspective, she was discharged on apixaban, clopidogrel, carvedilol, amlodipine, losartan, and rosuvastatin.   Following discharge, she was readmitted to the hospital with sudden onset of left-sided weakness with expressive aphasia and dysarthria which resolved prior to presentation.  MRI of the brain notable for multiple small embolic infarcts.  She was evaluated by neurology with recommendation to continue apixaban and clopidogrel as well as recently titrated dose of rosuvastatin.  Prilosec was changed to Protonix.  She was seen in hospital follow-up on 12/18/2022 and was doing very well from cardiac perspective, without symptoms of angina or decompensation.  There were no residual deficits from her CVA.  She was adherent and tolerating cardiac medications without issues.  She was maintaining sinus rhythm.    Since we last saw her, she has been participating with cardiac rehab.  She comes in accompanied by her daughter today and continues to do very well from a cardiac perspective.  She is without symptoms of angina or cardiac decompensation.  No progressive dyspnea, orthopnea, lower extremity swelling, PND, or early satiety.  No falls, hematochezia, or melena.  She has enjoyed working with cardiac rehab and plans to progress to [facility].  She is adherent and tolerating cardiac medications including apixaban and clopidogrel without issues.  No dizziness, presyncope,  or syncope.  Blood pressures have ranged from 102 to 153 after medications.  She does note some fatigue after taking her medications, though is okay with this.  Otherwise, she does not have any cardiac concerns at this time.   Labs independently reviewed: 01/2023 - TC 108, TG 133, HDL 37, LDL 44, potassium 4.5, BUN  23, SCr 0.95, albumin 3.8, AST/ALT normal, Hgb 12.5, PLT 208 11/2022 - magnesium 2.0, LP(a) 17.6, A1c 7.2   Past Medical History:  Diagnosis Date   A-fib ([place])    Depression    Diabetes mellitus without complication (HCC)    GERD (gastroesophageal reflux disease)    Hypercholesteremia    Hypertension     Past Surgical History:  Procedure Laterality Date   CORONARY/GRAFT ACUTE MI REVASCULARIZATION N/A 11/19/2022   Procedure: Coronary/Graft Acute MI Revascularization;  Surgeon: Wellington Hampshire, MD;  Location: [facility];  Service: Cardiovascular;  Laterality: N/A;   CYSTOSCOPY     LEFT HEART CATH AND CORONARY ANGIOGRAPHY N/A 11/19/2022   Procedure: LEFT HEART CATH AND CORONARY ANGIOGRAPHY;  Surgeon: Wellington Hampshire, MD;  Location: [facility];  Service: Cardiovascular;  Laterality: N/A;    Current Medications: Current Meds  Medication Sig   ACCU-CHEK GUIDE test strip    ALPRAZolam (XANAX) 0.25 MG tablet Take 0.25 mg by mouth at bedtime as needed for sleep.   amLODipine (NORVASC) 10 MG tablet Take 1 tablet (10 mg total) by mouth daily.   apixaban (ELIQUIS) 5 MG TABS tablet Take 1 tablet (5 mg total) by mouth 2 (two) times daily.   carvedilol (COREG) 12.5 MG tablet Take 1 tablet (12.5 mg total) by mouth 2 (two) times daily.   clopidogrel (PLAVIX) 75 MG tablet Take 1 tablet (75 mg total) by mouth daily.   escitalopram (LEXAPRO) 10 MG tablet Take 10 mg by mouth daily.   losartan (COZAAR) 100 MG tablet Take 1 tablet (100 mg total) by mouth daily.   magnesium oxide (MAG-OX) 400 (240 Mg) MG tablet Take 1 tablet by mouth daily.   metFORMIN (GLUMETZA) 500 MG (MOD) 24 hr tablet Take 500 mg by mouth 2 (two) times daily with a meal.   pantoprazole (PROTONIX) 40 MG tablet Take 1 tablet (40 mg total) by mouth daily.   rosuvastatin (CRESTOR) 40 MG tablet Take 1 tablet (40 mg total) by mouth daily.   [DISCONTINUED] amLODipine (NORVASC) 5 MG tablet Take 1 tablet (5 mg total) by  mouth daily.   [DISCONTINUED] carvedilol (COREG) 25 MG tablet Take 1 tablet (25 mg total) by mouth 2 (two) times daily with a meal.    Allergies:   Keflex [cephalexin], Hydrochlorothiazide w-triamterene, Lisinopril, Penicillins, and Statins   Social History   Socioeconomic History   Marital status: Widowed    Spouse name: Not on file   Number of children: Not on file   Years of education: Not on file   Highest education level: Not on file  Occupational History   Not on file  Tobacco Use   Smoking status: Former    Packs/day: 0.25    Years: 20.00    Total pack years: 5.00    Types: Cigarettes    Quit date: 2002    Years since quitting: 22.1   Smokeless tobacco: Never  Vaping Use   Vaping Use: Never used  Substance and Sexual Activity   Alcohol use: No   Drug use: No   Sexual activity: Not on file  Other Topics Concern  Not on file  Social History Narrative   Not on file   Social Determinants of Health   Financial Resource Strain: Not on file  Food Insecurity: No Food Insecurity (11/19/2022)   Hunger Vital Sign    Worried About Running Out of Food in the Last Year: Never true    Ran Out of Food in the Last Year: Never true  Transportation Needs: No Transportation Needs (11/19/2022)   PRAPARE - Hydrologist (Medical): No    Lack of Transportation (Non-Medical): No  Physical Activity: Not on file  Stress: Not on file  Social Connections: Not on file     Family History:  The patient's family history includes CVA in her father and mother; Diabetes in her mother.  ROS:   12-point review of systems is negative unless otherwise noted in the HPI.   EKGs/Labs/Other Studies Reviewed:    Studies reviewed were summarized above. The additional studies were reviewed today:  2D echo 11/10/2018: - Left ventricle: Wall thickness was increased in a pattern of mild    LVH. Systolic function was normal. The estimated ejection    fraction was in  the range of 55% to 60%. Doppler parameters are    consistent with abnormal left ventricular relaxation (grade 1    diastolic dysfunction).  - Right ventricle: The cavity size was mildly dilated.  - Pulmonary arteries: PA peak pressure: 37 mm Hg (S).  __________   LHC 11/19/2022:   1st Mrg lesion is 100% stenosed.   Prox LAD to Mid LAD lesion is 40% stenosed.   1st Diag lesion is 60% stenosed.   Mid LAD to Dist LAD lesion is 99% stenosed.   RPDA lesion is 85% stenosed.   Prox RCA to Mid RCA lesion is 40% stenosed.   Mid RCA to Dist RCA lesion is 99% stenosed.   A drug-eluting stent was successfully placed using a STENT ONYX FRONTIER 3.5X30.   Post intervention, there is a 0% residual stenosis.   There is moderate left ventricular systolic dysfunction.   LV end diastolic pressure is severely elevated.   The left ventricular ejection fraction is 35-45% by visual estimate.   1.  Severe three-vessel coronary artery disease.  The culprit is a subtotal occlusion of the mid right coronary artery.  In addition, the patient has occluded OM1 with collaterals and diffuse subocclusive disease throughout the mid and distal LAD. 2.  Moderately reduced LV systolic function with severely elevated left ventricular end-diastolic pressure 35 mmHg. 3.  Successful angioplasty and drug-eluting stent placement to the mid right coronary artery. 4.  Door to device was delayed by difficult access via the right radial artery given calcified, tortuous and stenosed proximal radial artery.  This was navigated successfully with a run-through wire using balloon assisted tracking.   Recommendations: Will treat with aspirin and ticagrelor for now. The patient is chronically anticoagulated for atrial fibrillation.  Anticoagulation can likely be resumed tomorrow if no bleeding issues but should consider switching Xarelto to Eliquis and stopping aspirin to minimize the risk of bleeding considering her age. I do not think the  LAD is optimal for PCI given long diffuse disease.  Favor medical therapy. __________   2D echo 11/20/2022: 1. Left ventricular ejection fraction, by estimation, is 60 to 65%. The  left ventricle has normal function. The left ventricle demonstrates  regional wall motion abnormalities (see scoring diagram/findings for  description). There is mild left ventricular   hypertrophy. Left ventricular  diastolic parameters are consistent with  Grade I diastolic dysfunction (impaired relaxation). Elevated left atrial  pressure. There is mild hypokinesis of the left ventricular, basal-mid  inferior wall and inferolateral wall.   2. Right ventricular systolic function is normal. The right ventricular  size is normal. Tricuspid regurgitation signal is inadequate for assessing  PA pressure.   3. The mitral valve is normal in structure. No evidence of mitral valve  regurgitation. No evidence of mitral stenosis.   4. The aortic valve is tricuspid. Aortic valve regurgitation is trivial.  No aortic stenosis is present.    EKG:  EKG is ordered today.  The EKG ordered today demonstrates sinus bradycardia, 59 bpm, poor R wave progression along the precordial leads, nonspecific lateral ST-T changes  Recent Labs: 11/22/2022: Magnesium 2.0 01/21/2023: ALT 13; BUN 23; Creatinine, Ser 0.95; Hemoglobin 12.5; Platelets 208; Potassium 4.5; Sodium 140  Recent Lipid Panel    Component Value Date/Time   CHOL 108 01/21/2023 1023   TRIG 133 01/21/2023 1023   HDL 37 (L) 01/21/2023 1023   CHOLHDL 2.9 01/21/2023 1023   VLDL 27 01/21/2023 1023   LDLCALC 44 01/21/2023 1023   LDLDIRECT 154 (H) 11/19/2022 1446    PHYSICAL EXAM:    VS:  BP (!) 140/80 (BP Location: Left Arm, Patient Position: Sitting, Cuff Size: Normal)   Pulse (!) 59   Ht '4\' 9"'$  (1.448 m)   Wt 136 lb 12.8 oz (62.1 kg)   SpO2 97%   BMI 29.60 kg/m   BMI: Body mass index is 29.6 kg/m.  Physical Exam Vitals reviewed.  Constitutional:       Appearance: She is well-developed.  HENT:     Head: Normocephalic and atraumatic.  Eyes:     General:        Right eye: No discharge.        Left eye: No discharge.  Neck:     Vascular: No JVD.  Cardiovascular:     Rate and Rhythm: Normal rate and regular rhythm.     Heart sounds: Normal heart sounds, S1 normal and S2 normal. Heart sounds not distant. No midsystolic click and no opening snap. No murmur heard.    No friction rub.  Pulmonary:     Effort: Pulmonary effort is normal. No respiratory distress.     Breath sounds: Normal breath sounds. No decreased breath sounds, wheezing or rales.  Chest:     Chest wall: No tenderness.  Abdominal:     General: There is no distension.  Musculoskeletal:     Cervical back: Normal range of motion.     Right lower leg: No edema.     Left lower leg: No edema.  Skin:    General: Skin is warm and dry.     Nails: There is no clubbing.  Neurological:     Mental Status: She is alert and oriented to person, place, and time.  Psychiatric:        Speech: Speech normal.        Behavior: Behavior normal.        Thought Content: Thought content normal.        Judgment: Judgment normal.     Wt Readings from Last 3 Encounters:  01/22/23 136 lb 12.8 oz (62.1 kg)  12/18/22 134 lb 12.8 oz (61.1 kg)  12/12/22 136 lb 12.8 oz (62.1 kg)     ASSESSMENT & PLAN:   CAD involving the native coronary arteries with recent inferior STEMI: Continues to do  well from a cardiac perspective and is without symptoms of angina, or decompensation.  She is on apixaban because of aspirin given underlying A-fib, which will be continued with albuterol with antiplatelet therapy not being interrupted for 12 months, dated back to date of PCI.  Continue aggressive risk factor modification and secondary prevention including low-dose carvedilol, losartan, rosuvastatin, and amlodipine.  No indication for further ischemic testing at this time.  PAF: Maintaining sinus rhythm with a  mildly bradycardic rate.  Given underlying fatigue, we will decrease carvedilol to 12.5 twice daily.  CHA2DS2-VASc at least 8.  She remains on apixaban 5 mg twice daily and does not meet reduced dosing criteria.  No symptoms concerning for bleeding.  Recent labs showed stable renal function, electrolytes, and Hgb.  HTN: Blood pressure has been reasonably controlled at home.  We will decrease carvedilol to 12.5 mg twice daily as outlined above to see if this improves her fatigue.  To compensate for this, we will titrate amlodipine to 10 mg daily.  She otherwise remains on with recent renal function and electrolytes noted to be stable.  HLD: LDL 44 with a triglyceride of 133 with normal AST/ALT in 01/2023.  She remains on rosuvastatin 40 mg.  DM2: A1c 7.2.  Ongoing management per PCP.  CVA: No residual deficits.  She remains on apixaban and clopidogrel along with rosuvastatin as outlined above.   Disposition: F/u with Dr. Fletcher Anon or an APP in 2 months.   Medication Adjustments/Labs and Tests Ordered: Current medicines are reviewed at length with the patient today.  Concerns regarding medicines are outlined above. Medication changes, Labs and Tests ordered today are summarized above and listed in the Patient Instructions accessible in Encounters.   Signed, Christell Faith, PA-C 01/22/2023 12:50 PM     [facility] [address] [place], [place] [address] [phone]

## 2023-01-18 ENCOUNTER — Encounter: Payer: Medicare PPO | Attending: Cardiovascular Disease | Admitting: *Deleted

## 2023-01-18 DIAGNOSIS — I252 Old myocardial infarction: Secondary | ICD-10-CM | POA: Insufficient documentation

## 2023-01-18 DIAGNOSIS — Z5189 Encounter for other specified aftercare: Secondary | ICD-10-CM | POA: Diagnosis not present

## 2023-01-18 DIAGNOSIS — Z955 Presence of coronary angioplasty implant and graft: Secondary | ICD-10-CM | POA: Insufficient documentation

## 2023-01-18 DIAGNOSIS — I213 ST elevation (STEMI) myocardial infarction of unspecified site: Secondary | ICD-10-CM

## 2023-01-18 NOTE — Progress Notes (Signed)
Daily Session Note  Patient Details  Name: Vanessa Young MRN: [id number] Date of Birth: 02-14-1941 Referring Provider:   Flowsheet Row Cardiac Rehab from 12/12/2022 in [facility] Cardiac and Pulmonary Rehab  Referring Provider Kathlyn Sacramento MD       Encounter Date: 01/18/2023  Check In:  Session Check In - 01/18/23 1149       Check-In   Supervising physician immediately available to respond to emergencies See telemetry face sheet for immediately available ER MD    Location [facility]-Cardiac & Pulmonary Rehab    Staff Present Heath Lark, RN, BSN, CCRP;Jessica Rutledge, MA, RCEP, CCRP, CCET;Joseph [place], [place]    Virtual Visit No    Medication changes reported     No    Fall or balance concerns reported    No    Warm-up and Cool-down Performed on first and last piece of equipment    Resistance Training Performed Yes    VAD Patient? No    PAD/SET Patient? No      Pain Assessment   Currently in Pain? No/denies                Social History   Tobacco Use  Smoking Status Former   Packs/day: 0.25   Years: 20.00   Total pack years: 5.00   Types: Cigarettes   Quit date: 2002   Years since quitting: 22.1  Smokeless Tobacco Never    Goals Met:  Independence with exercise equipment Exercise tolerated well No report of concerns or symptoms today  Goals Unmet:  Not Applicable  Comments: Pt able to follow exercise prescription today without complaint.  Will continue to monitor for progression.    Dr. Emily Filbert is Medical Director for [place].  Dr. Ottie Glazier is Medical Director for [facility] Pulmonary Rehabilitation.

## 2023-01-21 ENCOUNTER — Encounter: Payer: Medicare PPO | Admitting: *Deleted

## 2023-01-21 ENCOUNTER — Other Ambulatory Visit
Admission: RE | Admit: 2023-01-21 | Discharge: 2023-01-21 | Disposition: A | Discharge status: Home / Self Care | Payer: Medicare PPO | Attending: Physician Assistant | Admitting: Physician Assistant

## 2023-01-21 DIAGNOSIS — I2111 ST elevation (STEMI) myocardial infarction involving right coronary artery: Secondary | ICD-10-CM | POA: Diagnosis present

## 2023-01-21 DIAGNOSIS — I480 Paroxysmal atrial fibrillation: Secondary | ICD-10-CM | POA: Insufficient documentation

## 2023-01-21 DIAGNOSIS — Z5189 Encounter for other specified aftercare: Secondary | ICD-10-CM | POA: Diagnosis not present

## 2023-01-21 DIAGNOSIS — E785 Hyperlipidemia, unspecified: Secondary | ICD-10-CM | POA: Diagnosis present

## 2023-01-21 DIAGNOSIS — I213 ST elevation (STEMI) myocardial infarction of unspecified site: Secondary | ICD-10-CM

## 2023-01-21 DIAGNOSIS — Z955 Presence of coronary angioplasty implant and graft: Secondary | ICD-10-CM

## 2023-01-21 LAB — COMPREHENSIVE METABOLIC PANEL
ALT: 13 U/L (ref 0–44)
AST: 21 U/L (ref 15–41)
Albumin: 3.8 g/dL (ref 3.5–5.0)
Alkaline Phosphatase: 51 U/L (ref 38–126)
Anion gap: 10 (ref 5–15)
BUN: 23 mg/dL (ref 8–23)
CO2: 24 mmol/L (ref 22–32)
Calcium: 9.2 mg/dL (ref 8.9–10.3)
Chloride: 106 mmol/L (ref 98–111)
Creatinine, Ser: 0.95 mg/dL (ref 0.44–1.00)
GFR, Estimated: 60 mL/min — ABNORMAL LOW (ref >60)
Glucose, Bld: 185 mg/dL — ABNORMAL HIGH (ref 70–99)
Potassium: 4.5 mmol/L (ref 3.5–5.1)
Sodium: 140 mmol/L (ref 135–145)
Total Bilirubin: 0.7 mg/dL (ref 0.3–1.2)
Total Protein: 6.8 g/dL (ref 6.5–8.1)

## 2023-01-21 LAB — CBC
HCT: 39.3 % (ref 36.0–46.0)
Hemoglobin: 12.5 g/dL (ref 12.0–15.0)
MCH: 28.4 pg (ref 26.0–34.0)
MCHC: 31.8 g/dL (ref 30.0–36.0)
MCV: 89.3 fL (ref 80.0–100.0)
Platelets: 208 10*3/uL (ref 150–400)
RBC: 4.4 MIL/uL (ref 3.87–5.11)
RDW: 13.1 % (ref 11.5–15.5)
WBC: 5.7 10*3/uL (ref 4.0–10.5)
nRBC: 0 % (ref 0.0–0.2)

## 2023-01-21 LAB — LIPID PANEL
Cholesterol: 108 mg/dL (ref 0–200)
HDL: 37 mg/dL — ABNORMAL LOW (ref >40)
LDL Cholesterol: 44 mg/dL (ref 0–99)
Total CHOL/HDL Ratio: 2.9 RATIO
Triglycerides: 133 mg/dL (ref <150)
VLDL: 27 mg/dL (ref 0–40)

## 2023-01-21 NOTE — Progress Notes (Signed)
Daily Session Note  Patient Details  Name: Vanessa Young MRN: [id number] Date of Birth: 10-11-1941 Referring Provider:   Flowsheet Row Cardiac Rehab from 12/12/2022 in [facility] Cardiac and Pulmonary Rehab  Referring Provider Kathlyn Sacramento MD       Encounter Date: 01/21/2023  Check In:  Session Check In - 01/21/23 1111       Check-In   Supervising physician immediately available to respond to emergencies See telemetry face sheet for immediately available ER MD    Location [facility]-Cardiac & Pulmonary Rehab    Staff Present Darlyne Russian, RN, Doyce Para, BS, ACSM CEP, Exercise Physiologist;Meredith Sherryll Burger, RN BSN;Noah Tickle, BS, Exercise Physiologist    Virtual Visit No    Medication changes reported     No    Fall or balance concerns reported    No    Warm-up and Cool-down Performed on first and last piece of equipment    Resistance Training Performed Yes    VAD Patient? No    PAD/SET Patient? No      Pain Assessment   Currently in Pain? No/denies                Social History   Tobacco Use  Smoking Status Former   Packs/day: 0.25   Years: 20.00   Total pack years: 5.00   Types: Cigarettes   Quit date: 2002   Years since quitting: 22.1  Smokeless Tobacco Never    Goals Met:  Independence with exercise equipment Exercise tolerated well No report of concerns or symptoms today Strength training completed today  Goals Unmet:  Not Applicable  Comments: Pt able to follow exercise prescription today without complaint.  Will continue to monitor for progression.    Dr. Emily Filbert is Medical Director for [place].  Dr. Ottie Glazier is Medical Director for [facility] Pulmonary Rehabilitation.

## 2023-01-22 ENCOUNTER — Encounter: Payer: Self-pay | Admitting: Physician Assistant

## 2023-01-22 ENCOUNTER — Ambulatory Visit: Payer: Medicare PPO | Attending: Physician Assistant | Admitting: Physician Assistant

## 2023-01-22 VITALS — BP 140/80 | HR 59 | Ht <= 58 in | Wt 136.8 lb

## 2023-01-22 DIAGNOSIS — E785 Hyperlipidemia, unspecified: Secondary | ICD-10-CM

## 2023-01-22 DIAGNOSIS — I2111 ST elevation (STEMI) myocardial infarction involving right coronary artery: Secondary | ICD-10-CM | POA: Diagnosis not present

## 2023-01-22 DIAGNOSIS — I2510 Atherosclerotic heart disease of native coronary artery without angina pectoris: Secondary | ICD-10-CM

## 2023-01-22 DIAGNOSIS — I10 Essential (primary) hypertension: Secondary | ICD-10-CM

## 2023-01-22 DIAGNOSIS — I480 Paroxysmal atrial fibrillation: Secondary | ICD-10-CM

## 2023-01-22 DIAGNOSIS — Z8673 Personal history of transient ischemic attack (TIA), and cerebral infarction without residual deficits: Secondary | ICD-10-CM

## 2023-01-22 MED ORDER — CARVEDILOL 12.5 MG PO TABS: 12.5000 mg | ORAL_TABLET | Freq: Two times a day (BID) | ORAL | 3 refills | Status: DC

## 2023-01-22 MED ORDER — AMLODIPINE BESYLATE 10 MG PO TABS: 10.0000 mg | ORAL_TABLET | Freq: Every day | ORAL | 3 refills | Status: DC

## 2023-01-22 NOTE — Patient Instructions (Addendum)
Medication Instructions:  Your physician has recommended you make the following change in your medication:   DECREASE Carvedilol to 12.5 mg twice daily INCREASE Amlodipine to 10 mg once daily.   *If you need a refill on your cardiac medications before your next appointment, please call your pharmacy*   Lab Work: None  If you have labs (blood work) drawn today and your tests are completely normal, you will receive your results only by: [place] (if you have MyChart) OR A paper copy in the mail If you have any lab test that is abnormal or we need to change your treatment, we will call you to review the results.   Testing/Procedures: None   Follow-Up: At [facility], you and your health needs are our priority.  As part of our continuing mission to provide you with exceptional heart care, we have created designated Provider Care Teams.  These Care Teams include your primary Cardiologist (physician) and Advanced Practice Providers (APPs -  Physician Assistants and Nurse Practitioners) who all work together to provide you with the care you need, when you need it.   Your next appointment:   2 month(s)  Provider:   Kathlyn Sacramento, MD or Christell Faith, PA-C

## 2023-01-23 ENCOUNTER — Encounter: Payer: Medicare PPO | Admitting: *Deleted

## 2023-01-23 DIAGNOSIS — Z955 Presence of coronary angioplasty implant and graft: Secondary | ICD-10-CM

## 2023-01-23 DIAGNOSIS — I213 ST elevation (STEMI) myocardial infarction of unspecified site: Secondary | ICD-10-CM

## 2023-01-23 DIAGNOSIS — Z5189 Encounter for other specified aftercare: Secondary | ICD-10-CM | POA: Diagnosis not present

## 2023-01-23 NOTE — Progress Notes (Signed)
Daily Session Note  Patient Details  Name: Vanessa Young MRN: [id number] Date of Birth: 02/24/1941 Referring Provider:   Flowsheet Row Cardiac Rehab from 12/12/2022 in [facility] Cardiac and Pulmonary Rehab  Referring Provider Kathlyn Sacramento MD       Encounter Date: 01/23/2023  Check In:  Session Check In - 01/23/23 1132       Check-In   Supervising physician immediately available to respond to emergencies See telemetry face sheet for immediately available ER MD    Location [facility]-Cardiac & Pulmonary Rehab    Staff Present Darlyne Russian, RN, ADN;Meredith Sherryll Burger, RN BSN;Jessica Luan Pulling, MA, RCEP, CCRP, Bertram Gala, MS, ACSM CEP, Exercise Physiologist;Noah Tickle, BS, Exercise Physiologist    Virtual Visit No    Medication changes reported     Yes    Comments decr carvedilol 12.5 BID; incr amlodipine 10 mg QD    Fall or balance concerns reported    No    Warm-up and Cool-down Performed on first and last piece of equipment    Resistance Training Performed Yes    VAD Patient? No    PAD/SET Patient? No      Pain Assessment   Currently in Pain? No/denies                Social History   Tobacco Use  Smoking Status Former   Packs/day: 0.25   Years: 20.00   Total pack years: 5.00   Types: Cigarettes   Quit date: 2002   Years since quitting: 22.1  Smokeless Tobacco Never    Goals Met:  Independence with exercise equipment Exercise tolerated well No report of concerns or symptoms today Strength training completed today  Goals Unmet:  Not Applicable  Comments: Pt able to follow exercise prescription today without complaint.  Will continue to monitor for progression.    Dr. Emily Filbert is Medical Director for [place].  Dr. Ottie Glazier is Medical Director for [facility] Pulmonary Rehabilitation.

## 2023-01-25 ENCOUNTER — Encounter: Payer: Medicare PPO | Admitting: *Deleted

## 2023-01-25 DIAGNOSIS — I213 ST elevation (STEMI) myocardial infarction of unspecified site: Secondary | ICD-10-CM

## 2023-01-25 DIAGNOSIS — Z5189 Encounter for other specified aftercare: Secondary | ICD-10-CM | POA: Diagnosis not present

## 2023-01-25 DIAGNOSIS — Z955 Presence of coronary angioplasty implant and graft: Secondary | ICD-10-CM

## 2023-01-25 NOTE — Progress Notes (Signed)
Daily Session Note  Patient Details  Name: Vanessa Young MRN: [id number] Date of Birth: 01-02-1941 Referring Provider:   Flowsheet Row Cardiac Rehab from 12/12/2022 in [facility] Cardiac and Pulmonary Rehab  Referring Provider Kathlyn Sacramento MD       Encounter Date: 01/25/2023  Check In:  Session Check In - 01/25/23 1132       Check-In   Supervising physician immediately available to respond to emergencies See telemetry face sheet for immediately available ER MD    Location [facility]-Cardiac & Pulmonary Rehab    Staff Present Heath Lark, RN, BSN, CCRP;Jessica Polo, MA, RCEP, CCRP, CCET;Joseph [place], [place]    Virtual Visit No    Medication changes reported     No    Fall or balance concerns reported    No    Warm-up and Cool-down Performed on first and last piece of equipment    Resistance Training Performed Yes    VAD Patient? No    PAD/SET Patient? No      Pain Assessment   Currently in Pain? No/denies                Social History   Tobacco Use  Smoking Status Former   Packs/day: 0.25   Years: 20.00   Total pack years: 5.00   Types: Cigarettes   Quit date: 2002   Years since quitting: 22.1  Smokeless Tobacco Never    Goals Met:  Independence with exercise equipment Exercise tolerated well No report of concerns or symptoms today  Goals Unmet:  Not Applicable  Comments: Pt able to follow exercise prescription today without complaint.  Will continue to monitor for progression.    Dr. Emily Filbert is Medical Director for [place].  Dr. Ottie Glazier is Medical Director for [facility] Pulmonary Rehabilitation.

## 2023-01-28 ENCOUNTER — Encounter: Payer: Medicare PPO | Admitting: *Deleted

## 2023-01-28 DIAGNOSIS — Z5189 Encounter for other specified aftercare: Secondary | ICD-10-CM | POA: Diagnosis not present

## 2023-01-28 DIAGNOSIS — I213 ST elevation (STEMI) myocardial infarction of unspecified site: Secondary | ICD-10-CM

## 2023-01-28 DIAGNOSIS — Z955 Presence of coronary angioplasty implant and graft: Secondary | ICD-10-CM

## 2023-01-28 NOTE — Progress Notes (Signed)
Daily Session Note  Patient Details  Name: Vanessa Young MRN: [id number] Date of Birth: 27-Apr-1941 Referring Provider:   Flowsheet Row Cardiac Rehab from 12/12/2022 in [facility] Cardiac and Pulmonary Rehab  Referring Provider Kathlyn Sacramento MD       Encounter Date: 01/28/2023  Check In:  Session Check In - 01/28/23 1132       Check-In   Supervising physician immediately available to respond to emergencies See telemetry face sheet for immediately available ER MD    Location [facility]-Cardiac & Pulmonary Rehab    Staff Present Renita Papa, RN BSN;Noah Tickle, BS, Exercise Physiologist;Joseph Hammett, RCP,RRT,BSRT;Jessica [place], [place], RCEP, CCRP, CCET    Virtual Visit No    Medication changes reported     No    Fall or balance concerns reported    No    Warm-up and Cool-down Performed on first and last piece of equipment    Resistance Training Performed Yes    VAD Patient? No    PAD/SET Patient? No      Pain Assessment   Currently in Pain? No/denies                Social History   Tobacco Use  Smoking Status Former   Packs/day: 0.25   Years: 20.00   Total pack years: 5.00   Types: Cigarettes   Quit date: 2002   Years since quitting: 22.1  Smokeless Tobacco Never    Goals Met:  Independence with exercise equipment Exercise tolerated well No report of concerns or symptoms today Strength training completed today  Goals Unmet:  Not Applicable  Comments: Pt able to follow exercise prescription today without complaint.  Will continue to monitor for progression.    Dr. Emily Filbert is Medical Director for [place].  Dr. Ottie Glazier is Medical Director for [facility] Pulmonary Rehabilitation.

## 2023-01-29 ENCOUNTER — Encounter: Payer: Medicare PPO | Admitting: *Deleted

## 2023-01-29 DIAGNOSIS — I213 ST elevation (STEMI) myocardial infarction of unspecified site: Secondary | ICD-10-CM

## 2023-01-29 NOTE — Progress Notes (Signed)
Completed initial RD consultation ?

## 2023-01-30 ENCOUNTER — Encounter: Payer: Medicare PPO | Admitting: *Deleted

## 2023-01-30 DIAGNOSIS — I213 ST elevation (STEMI) myocardial infarction of unspecified site: Secondary | ICD-10-CM

## 2023-01-30 DIAGNOSIS — Z5189 Encounter for other specified aftercare: Secondary | ICD-10-CM | POA: Diagnosis not present

## 2023-01-30 DIAGNOSIS — Z955 Presence of coronary angioplasty implant and graft: Secondary | ICD-10-CM

## 2023-01-30 NOTE — Progress Notes (Signed)
Daily Session Note  Patient Details  Name: Vanessa Young MRN: [id number] Date of Birth: Aug 11, 1941 Referring Provider:   Flowsheet Row Cardiac Rehab from 12/12/2022 in [facility] Cardiac and Pulmonary Rehab  Referring Provider Kathlyn Sacramento MD       Encounter Date: 01/30/2023  Check In:  Session Check In - 01/30/23 1149       Check-In   Supervising physician immediately available to respond to emergencies See telemetry face sheet for immediately available ER MD    Location [facility]-Cardiac & Pulmonary Rehab    Staff Present Darlyne Russian, RN, ADN;Joseph Tessie Fass, RCP,RRT,BSRT;Noah Tickle, BS, Exercise Physiologist    Virtual Visit No    Medication changes reported     No    Fall or balance concerns reported    No    Warm-up and Cool-down Performed on first and last piece of equipment    Resistance Training Performed Yes    VAD Patient? No    PAD/SET Patient? No      Pain Assessment   Currently in Pain? No/denies                Social History   Tobacco Use  Smoking Status Former   Packs/day: 0.25   Years: 20.00   Total pack years: 5.00   Types: Cigarettes   Quit date: 2002   Years since quitting: 22.1  Smokeless Tobacco Never    Goals Met:  Independence with exercise equipment Exercise tolerated well No report of concerns or symptoms today Strength training completed today  Goals Unmet:  Not Applicable  Comments: Pt able to follow exercise prescription today without complaint.  Will continue to monitor for progression.    Dr. Emily Filbert is Medical Director for [place].  Dr. Ottie Glazier is Medical Director for [facility] Pulmonary Rehabilitation.

## 2023-01-31 ENCOUNTER — Other Ambulatory Visit: Payer: Self-pay | Admitting: *Deleted

## 2023-01-31 MED ORDER — PANTOPRAZOLE SODIUM 40 MG PO TBEC: 40.0000 mg | DELAYED_RELEASE_TABLET | Freq: Every day | ORAL | 1 refills | Status: DC

## 2023-02-01 ENCOUNTER — Encounter: Payer: Medicare PPO | Admitting: *Deleted

## 2023-02-01 DIAGNOSIS — I213 ST elevation (STEMI) myocardial infarction of unspecified site: Secondary | ICD-10-CM

## 2023-02-01 DIAGNOSIS — Z955 Presence of coronary angioplasty implant and graft: Secondary | ICD-10-CM

## 2023-02-01 DIAGNOSIS — Z5189 Encounter for other specified aftercare: Secondary | ICD-10-CM | POA: Diagnosis not present

## 2023-02-01 NOTE — Progress Notes (Signed)
Daily Session Note  Patient Details  Name: Vanessa Young MRN: [id number] Date of Birth: 29-May-1941 Referring Provider:   Flowsheet Row Cardiac Rehab from 12/12/2022 in [facility] Cardiac and Pulmonary Rehab  Referring Provider Kathlyn Sacramento MD       Encounter Date: 02/01/2023  Check In:  Session Check In - 02/01/23 1120       Check-In   Supervising physician immediately available to respond to emergencies See telemetry face sheet for immediately available ER MD    Location [facility]-Cardiac & Pulmonary Rehab    Staff Present Darlyne Russian, RN, ADN;Jessica Luan Pulling, MA, RCEP, CCRP, CCET;Joseph [place], [place]    Virtual Visit No    Medication changes reported     No    Fall or balance concerns reported    No    Warm-up and Cool-down Performed on first and last piece of equipment    Resistance Training Performed Yes    VAD Patient? No    PAD/SET Patient? No      Pain Assessment   Currently in Pain? No/denies                Social History   Tobacco Use  Smoking Status Former   Packs/day: 0.25   Years: 20.00   Total pack years: 5.00   Types: Cigarettes   Quit date: 2002   Years since quitting: 22.1  Smokeless Tobacco Never    Goals Met:  Independence with exercise equipment Exercise tolerated well No report of concerns or symptoms today Strength training completed today  Goals Unmet:  Not Applicable  Comments: Pt able to follow exercise prescription today without complaint.  Will continue to monitor for progression.    Dr. Emily Filbert is Medical Director for [place].  Dr. Ottie Glazier is Medical Director for [facility] Pulmonary Rehabilitation.

## 2023-02-04 ENCOUNTER — Encounter: Payer: Medicare PPO | Admitting: *Deleted

## 2023-02-04 DIAGNOSIS — Z5189 Encounter for other specified aftercare: Secondary | ICD-10-CM | POA: Diagnosis not present

## 2023-02-04 DIAGNOSIS — I213 ST elevation (STEMI) myocardial infarction of unspecified site: Secondary | ICD-10-CM

## 2023-02-04 DIAGNOSIS — Z955 Presence of coronary angioplasty implant and graft: Secondary | ICD-10-CM

## 2023-02-04 NOTE — Progress Notes (Signed)
Daily Session Note  Patient Details  Name: Vanessa Young MRN: [id number] Date of Birth: 1941/03/30 Referring Provider:   Flowsheet Row Cardiac Rehab from 12/12/2022 in [facility] Cardiac and Pulmonary Rehab  Referring Provider Kathlyn Sacramento MD       Encounter Date: 02/04/2023  Check In:  Session Check In - 02/04/23 1117       Check-In   Supervising physician immediately available to respond to emergencies See telemetry face sheet for immediately available ER MD    Location [facility]-Cardiac & Pulmonary Rehab    Staff Present Darlyne Russian, RN, Doyce Para, BS, ACSM CEP, Exercise Physiologist;Meredith Sherryll Burger, RN BSN;Noah Tickle, BS, Exercise Physiologist    Virtual Visit No    Medication changes reported     No    Fall or balance concerns reported    No    Warm-up and Cool-down Performed on first and last piece of equipment    Resistance Training Performed Yes    VAD Patient? No    PAD/SET Patient? No      Pain Assessment   Currently in Pain? No/denies                Social History   Tobacco Use  Smoking Status Former   Packs/day: 0.25   Years: 20.00   Total pack years: 5.00   Types: Cigarettes   Quit date: 2002   Years since quitting: 22.1  Smokeless Tobacco Never    Goals Met:  Independence with exercise equipment Exercise tolerated well No report of concerns or symptoms today Strength training completed today  Goals Unmet:  Not Applicable  Comments: Pt able to follow exercise prescription today without complaint.  Will continue to monitor for progression.    Dr. Emily Filbert is Medical Director for [place].  Dr. Ottie Glazier is Medical Director for [facility] Pulmonary Rehabilitation.

## 2023-02-06 ENCOUNTER — Encounter: Payer: Self-pay | Admitting: *Deleted

## 2023-02-06 ENCOUNTER — Encounter: Payer: Medicare PPO | Admitting: *Deleted

## 2023-02-06 ENCOUNTER — Other Ambulatory Visit: Payer: Self-pay | Admitting: *Deleted

## 2023-02-06 DIAGNOSIS — I213 ST elevation (STEMI) myocardial infarction of unspecified site: Secondary | ICD-10-CM

## 2023-02-06 DIAGNOSIS — Z5189 Encounter for other specified aftercare: Secondary | ICD-10-CM | POA: Diagnosis not present

## 2023-02-06 DIAGNOSIS — Z955 Presence of coronary angioplasty implant and graft: Secondary | ICD-10-CM

## 2023-02-06 MED ORDER — AMLODIPINE BESYLATE 10 MG PO TABS: 5.0000 mg | ORAL_TABLET | Freq: Every day | ORAL | 3 refills | Status: DC

## 2023-02-06 NOTE — Progress Notes (Signed)
Cardiac Individual Treatment Plan  Patient Details  Name: EPHRATA MCADAM MRN: [id number] Date of Birth: [birth year] Referring Provider:   Flowsheet Row Cardiac Rehab from 12/12/2022 in [facility] Cardiac and Pulmonary Rehab  Referring Provider Kathlyn Sacramento MD       Initial Encounter Date:  Flowsheet Row Cardiac Rehab from 12/12/2022 in [facility] Cardiac and Pulmonary Rehab  Date 12/12/22       Visit Diagnosis: ST elevation myocardial infarction (STEMI), unspecified artery ([place])  Status post coronary artery stent placement  Patient's Home Medications on Admission:  Current Outpatient Medications:    ACCU-CHEK GUIDE test strip, , Disp: , Rfl:    ALPRAZolam (XANAX) 0.25 MG tablet, Take 0.25 mg by mouth at bedtime as needed for sleep., Disp: , Rfl:    amLODipine (NORVASC) 10 MG tablet, Take 1 tablet (10 mg total) by mouth daily., Disp: 90 tablet, Rfl: 3   apixaban (ELIQUIS) 5 MG TABS tablet, Take 1 tablet (5 mg total) by mouth 2 (two) times daily., Disp: 60 tablet, Rfl: 2   carvedilol (COREG) 12.5 MG tablet, Take 1 tablet (12.5 mg total) by mouth 2 (two) times daily., Disp: 180 tablet, Rfl: 3   clopidogrel (PLAVIX) 75 MG tablet, Take 1 tablet (75 mg total) by mouth daily., Disp: 30 tablet, Rfl: 2   escitalopram (LEXAPRO) 10 MG tablet, Take 10 mg by mouth daily., Disp: , Rfl:    guanFACINE (TENEX) 1 MG tablet, Take 1 mg by mouth 2 (two) times daily.  (Patient not taking: Reported on 01/22/2023), Disp: , Rfl:    losartan (COZAAR) 100 MG tablet, Take 1 tablet (100 mg total) by mouth daily., Disp: 30 tablet, Rfl: 2   magnesium oxide (MAG-OX) 400 (240 Mg) MG tablet, Take 1 tablet by mouth daily., Disp: , Rfl:    metFORMIN (GLUMETZA) 500 MG (MOD) 24 hr tablet, Take 500 mg by mouth 2 (two) times daily with a meal., Disp: , Rfl:    pantoprazole (PROTONIX) 40 MG tablet, Take 1 tablet (40 mg total) by mouth daily., Disp: 90 tablet, Rfl: 1   rosuvastatin (CRESTOR) 40 MG tablet, Take 1 tablet (40 mg total) by  mouth daily., Disp: 30 tablet, Rfl: 2  Past Medical History: Past Medical History:  Diagnosis Date   A-fib ([place])    Depression    Diabetes mellitus without complication ([place])    GERD (gastroesophageal reflux disease)    Hypercholesteremia    Hypertension     Tobacco Use: Social History   Tobacco Use  Smoking Status Former   Packs/day: 0.25   Years: 20.00   Total pack years: 5.00   Types: Cigarettes   Quit date: 2002   Years since quitting: 22.1  Smokeless Tobacco Never    Labs: Review Flowsheet       Latest Ref Rng & Units 11/19/2022 11/24/2022 01/21/2023  Labs for ITP Cardiac and Pulmonary Rehab  Cholestrol 0 - 200 mg/dL 269  166  108   LDL (calc) 0 - 99 mg/dL UNABLE TO CALCULATE IF TRIGLYCERIDE OVER 400 mg/dL  91  44   Direct LDL 0 - 99 mg/dL 154  - -  HDL-C >40 mg/dL 51  34  37   Trlycerides <150 mg/dL 477  205  133   Hemoglobin A1c 4.8 - 5.6 % 7.2  - -     Exercise Target Goals: Exercise Program Goal: Individual exercise prescription set using results from initial 6 min walk test and THRR while considering  patient's activity barriers and  safety.   Exercise Prescription Goal: Initial exercise prescription builds to 30-45 minutes a day of aerobic activity, 2-3 days per week.  Home exercise guidelines will be given to patient during program as part of exercise prescription that the participant will acknowledge.   Education: Aerobic Exercise: - Group verbal and visual presentation on the components of exercise prescription. Introduces F.I.T.T principle from ACSM for exercise prescriptions.  Reviews F.I.T.T. principles of aerobic exercise including progression. Written material given at graduation. Flowsheet Row Cardiac Rehab from 01/30/2023 in [facility] Cardiac and Pulmonary Rehab  Education need identified 12/12/22  Date 01/02/23  Educator [facility]  Instruction Review Code 1- Verbalizes Understanding       Education: Resistance Exercise: - Group verbal and visual  presentation on the components of exercise prescription. Introduces F.I.T.T principle from ACSM for exercise prescriptions  Reviews F.I.T.T. principles of resistance exercise including progression. Written material given at graduation.    Education: Exercise & Equipment Safety: - Individual verbal instruction and demonstration of equipment use and safety with use of the equipment. Flowsheet Row Cardiac Rehab from 01/30/2023 in [facility] Cardiac and Pulmonary Rehab  Date 12/12/22  Educator NT  Instruction Review Code 1- Verbalizes Understanding       Education: Exercise Physiology & General Exercise Guidelines: - Group verbal and written instruction with models to review the exercise physiology of the cardiovascular system and associated critical values. Provides general exercise guidelines with specific guidelines to those with heart or lung disease.  Flowsheet Row Cardiac Rehab from 01/30/2023 in [facility] Cardiac and Pulmonary Rehab  Date 12/26/22  Educator [facility]  Instruction Review Code 1- Verbalizes Understanding       Education: Flexibility, Balance, Mind/Body Relaxation: - Group verbal and visual presentation with interactive activity on the components of exercise prescription. Introduces F.I.T.T principle from ACSM for exercise prescriptions. Reviews F.I.T.T. principles of flexibility and balance exercise training including progression. Also discusses the mind body connection.  Reviews various relaxation techniques to help reduce and manage stress (i.e. Deep breathing, progressive muscle relaxation, and visualization). Balance handout provided to take home. Written material given at graduation. Flowsheet Row Cardiac Rehab from 01/30/2023 in [facility] Cardiac and Pulmonary Rehab  Date 01/16/23  Educator [facility]  Instruction Review Code 1- Verbalizes Understanding       Activity Barriers & Risk Stratification:  Activity Barriers & Cardiac Risk Stratification - 12/12/22 1507       Activity Barriers &  Cardiac Risk Stratification   Activity Barriers Balance Concerns;Muscular Weakness    Cardiac Risk Stratification High             6 Minute Walk:  6 Minute Walk     Row Name 12/12/22 1506         6 Minute Walk   Phase Initial     Distance 955 feet     Walk Time 6 minutes     # of Rest Breaks 0     MPH 1.81     METS 1.39     RPE 12     Perceived Dyspnea  0     VO2 Peak 4.87     Symptoms Yes (comment)     Comments leg fatigue     Resting HR 55 bpm     Resting BP 124/66     Resting Oxygen Saturation  98 %     Exercise Oxygen Saturation  during 6 min walk 96 %     Max Ex. HR 55 bpm     Max Ex.  BP 134/66     2 Minute Post BP 122/64              Oxygen Initial Assessment:   Oxygen Re-Evaluation:   Oxygen Discharge (Final Oxygen Re-Evaluation):   Initial Exercise Prescription:  Initial Exercise Prescription - 12/12/22 1500       Date of Initial Exercise RX and Referring Provider   Date 12/12/22    Referring Provider Kathlyn Sacramento MD      Oxygen   Maintain Oxygen Saturation 88% or higher      Treadmill   MPH 1.6    Grade 0    Minutes 15    METs 2.23      NuStep   Level 2    SPM 80    Minutes 1.39    METs 1.39      Biostep-RELP   Level 1    SPM 50    Minutes 15    METs 1.39      Track   Laps 18    Minutes 15    METs 1.98      Prescription Details   Frequency (times per week) 3    Duration Progress to 30 minutes of continuous aerobic without signs/symptoms of physical distress      Intensity   THRR 40-80% of Max Heartrate 88-121    Ratings of Perceived Exertion 11-13    Perceived Dyspnea 0-4      Progression   Progression Continue to progress workloads to maintain intensity without signs/symptoms of physical distress.      Resistance Training   Training Prescription Yes    Weight 2 lb    Reps 10-15             Perform Capillary Blood Glucose checks as needed.  Exercise Prescription Changes:   Exercise  Prescription Changes     Row Name 12/12/22 1500 12/24/22 1400 01/07/23 1000 01/21/23 1300 01/23/23 1100     Response to Exercise   Blood Pressure (Admit) 124/66 122/62 92/60 104/78 --   Blood Pressure (Exercise) 134/66 132/60 100/58 135/72 --   Blood Pressure (Exit) 122/64 124/70 102/60 122/60 --   Heart Rate (Admit) 55 bpm 65 bpm 75 bpm 70 bpm --   Heart Rate (Exercise) 93 bpm 99 bpm 101 bpm 97 bpm --   Heart Rate (Exit) 56 bpm 63 bpm 79 bpm 74 bpm --   Oxygen Saturation (Admit) 98 % -- -- -- --   Oxygen Saturation (Exercise) 96 % -- -- -- --   Rating of Perceived Exertion (Exercise) 12 13 13 14 $ --   Perceived Dyspnea (Exercise) 0 -- -- -- --   Symptoms leg fatigue none none none --   Comments 6MWT Results 1st Full day of rehab -- -- --   Duration -- Progress to 30 minutes of  aerobic without signs/symptoms of physical distress Continue with 30 min of aerobic exercise without signs/symptoms of physical distress. Continue with 30 min of aerobic exercise without signs/symptoms of physical distress. --   Intensity -- THRR unchanged THRR unchanged THRR unchanged --     Progression   Progression -- Continue to progress workloads to maintain intensity without signs/symptoms of physical distress. Continue to progress workloads to maintain intensity without signs/symptoms of physical distress. Continue to progress workloads to maintain intensity without signs/symptoms of physical distress. --   Average METs -- 1.91 2.15 2.26 --     Resistance Training   Training Prescription -- Yes Yes Yes --  Weight -- 2 lb 2 lb 3 lb --   Reps -- 10-15 10-15 10-15 --     Interval Training   Interval Training -- No No No --     Treadmill   MPH -- -- 1.8 1.5 --   Grade -- -- 0 1 --   Minutes -- -- 15 15 --   METs -- -- 2.38 2.35 --     NuStep   Level -- -- 2 4 --   Minutes -- -- 15 15 --   METs -- -- 2.1 2.5 --     Biostep-RELP   Level -- 1 1 2 $ --   Minutes -- 15 15 15 $ --   METs -- 2 2 2 $ --      Track   Laps -- 15 23 23 $ --   Minutes -- 15 15 15 $ --   METs -- 1.82 2.25 2.25 --     Home Exercise Plan   Plans to continue exercise at -- -- -- -- [employer] (comment)  Pt walks at home and uses her personal 2 lb handweights for resistance training. She is also looking into joining a fitness center and expressed interest in the [place].   Frequency -- -- -- -- Add 2 additional days to program exercise sessions.   Initial Home Exercises Provided -- -- -- -- 01/23/23     Oxygen   Maintain Oxygen Saturation -- 88% or higher 88% or higher 88% or higher 88% or higher    Row Name 02/04/23 0900             Response to Exercise   Blood Pressure (Admit) 102/60       Blood Pressure (Exit) 102/60       Heart Rate (Admit) 67 bpm       Heart Rate (Exercise) 93 bpm       Heart Rate (Exit) 82 bpm       Rating of Perceived Exertion (Exercise) 13       Symptoms none       Duration Continue with 30 min of aerobic exercise without signs/symptoms of physical distress.       Intensity THRR unchanged         Progression   Progression Continue to progress workloads to maintain intensity without signs/symptoms of physical distress.       Average METs 2.47         Resistance Training   Training Prescription Yes       Weight 3 lb       Reps 10-15         Interval Training   Interval Training No         NuStep   Level 3       Minutes 15       METs 2.8         T5 Nustep   Level 1       Minutes 15       METs 2.1         Biostep-RELP   Level 2       Minutes 15       METs 2         Track   Laps 30       Minutes 15       METs 2.63         Home Exercise Plan   Plans to continue exercise at [employer] (comment)  Pt walks at home and  uses her personal 2 lb handweights for resistance training. She is also looking into joining a fitness center and expressed interest in the [place].       Frequency Add 2 additional days to program exercise sessions.       Initial Home  Exercises Provided 01/23/23         Oxygen   Maintain Oxygen Saturation 88% or higher                Exercise Comments:   Exercise Comments     Row Name 12/21/22 1153           Exercise Comments First full day of exercise!  Patient was oriented to gym and equipment including functions, settings, policies, and procedures.  Patient's individual exercise prescription and treatment plan were reviewed.  All starting workloads were established based on the results of the 6 minute walk test done at initial orientation visit.  The plan for exercise progression was also introduced and progression will be customized based on patient's performance and goals.                Exercise Goals and Review:   Exercise Goals     Row Name 12/12/22 1500             Exercise Goals   Increase Physical Activity Yes       Intervention Provide advice, education, support and counseling about physical activity/exercise needs.;Develop an individualized exercise prescription for aerobic and resistive training based on initial evaluation findings, risk stratification, comorbidities and participant's personal goals.       Expected Outcomes Short Term: Attend rehab on a regular basis to increase amount of physical activity.;Long Term: Add in home exercise to make exercise part of routine and to increase amount of physical activity.;Long Term: Exercising regularly at least 3-5 days a week.       Increase Strength and Stamina Yes       Intervention Provide advice, education, support and counseling about physical activity/exercise needs.;Develop an individualized exercise prescription for aerobic and resistive training based on initial evaluation findings, risk stratification, comorbidities and participant's personal goals.       Expected Outcomes Short Term: Increase workloads from initial exercise prescription for resistance, speed, and METs.;Short Term: Perform resistance training exercises routinely  during rehab and add in resistance training at home;Long Term: Improve cardiorespiratory fitness, muscular endurance and strength as measured by increased METs and functional capacity (6MWT)       Able to understand and use rate of perceived exertion (RPE) scale Yes       Intervention Provide education and explanation on how to use RPE scale       Expected Outcomes Short Term: Able to use RPE daily in rehab to express subjective intensity level;Long Term:  Able to use RPE to guide intensity level when exercising independently       Able to understand and use Dyspnea scale Yes       Intervention Provide education and explanation on how to use Dyspnea scale       Expected Outcomes Short Term: Able to use Dyspnea scale daily in rehab to express subjective sense of shortness of breath during exertion;Long Term: Able to use Dyspnea scale to guide intensity level when exercising independently       Knowledge and understanding of Target Heart Rate Range (THRR) Yes       Intervention Provide education and explanation of THRR including how the numbers were predicted and where they  are located for reference       Expected Outcomes Short Term: Able to state/look up THRR;Long Term: Able to use THRR to govern intensity when exercising independently;Short Term: Able to use daily as guideline for intensity in rehab       Able to check pulse independently Yes       Intervention Provide education and demonstration on how to check pulse in carotid and radial arteries.;Review the importance of being able to check your own pulse for safety during independent exercise       Expected Outcomes Short Term: Able to explain why pulse checking is important during independent exercise;Long Term: Able to check pulse independently and accurately       Understanding of Exercise Prescription Yes       Intervention Provide education, explanation, and written materials on patient's individual exercise prescription       Expected  Outcomes Short Term: Able to explain program exercise prescription;Long Term: Able to explain home exercise prescription to exercise independently                Exercise Goals Re-Evaluation :  Exercise Goals Re-Evaluation     Row Name 12/21/22 1153 12/24/22 1415 12/28/22 1116 01/07/23 1040 01/21/23 1332     Exercise Goal Re-Evaluation   Exercise Goals Review Able to understand and use rate of perceived exertion (RPE) scale;Knowledge and understanding of Target Heart Rate Range (THRR);Understanding of Exercise Prescription Understanding of Exercise Prescription;Increase Strength and Stamina;Increase Physical Activity Understanding of Exercise Prescription;Increase Strength and Stamina;Increase Physical Activity Understanding of Exercise Prescription;Increase Strength and Stamina;Increase Physical Activity Understanding of Exercise Prescription;Increase Strength and Stamina;Increase Physical Activity   Comments Reviewed RPE scale, THR and program prescription with pt today.  Pt voiced understanding and was given a copy of goals to take home. Tumeka is off to a good start in rehab. She had an average MET level of 1.91 METs during her first session in rehab. She also was able to walk 15 laps on the track. We will continue to monitor her progress in the program. Roshan is doing well in rehab.  She is on her fourth visit and holding pretty good.  She is still getting a little winded when she is walking and talking.  She feels liks she is off to a good start and looks forward to coming each day.  She is also still doing some her PT exercises to help with strengthing as well. Aytana continues to do well in rehab. She did increase her treadmill to a speed of 1.8 mph and was able to hit 23 laps on the track which has been her highest yet! She would benefit from increasing to level 2 on the Biostep as she has been consistent at level 1 for some time now. She continues to reach her THR each session. Will continue to  monitor. Kathelyn continues to do well in rehab. She increased her overall average MET level to 2.26 METs. She also improved to level 2 on the biostep and level 4 on the T4 Nustep. She has stayed consistent with her walking at 23 laps on the track. She did increase from 2 lb to 3 lb hand weights for resistance training. We will continue to monitor her progress.   Expected Outcomes Short: Use RPE daily to regulate intensity. Long: Follow program prescription in THR. Short: Continue to attend cardiac rehab. Long: Continue to follow exercise prescription. Short: continue to attend regularly Long: conitnue to follow exercise prescription,. Short: Increase to  level 2 on the Biostep Long: Continue to increase overall stamina and MET level Short: Continue to push for more laps on the track. Long: Continue to improve strength and stamina.    [place] Name 01/23/23 1149 02/04/23 0929           Exercise Goal Re-Evaluation   Exercise Goals Review Understanding of Exercise Prescription;Able to understand and use rate of perceived exertion (RPE) scale;Knowledge and understanding of Target Heart Rate Range (THRR);Able to understand and use Dyspnea scale;Able to check pulse independently Understanding of Exercise Prescription;Increase Physical Activity;Increase Strength and Stamina      Comments Reviewed home exercise with pt today.  Pt plans to use personal 2 lb hand weights for resistance training and walk for exercise.  She also is looking into the joining a fitness center and expressed interest in the [place]. Reviewed THR, pulse, RPE, sign and symptoms, pulse oximetery and when to call 911 or MD.  Also discussed weather considerations and indoor options.  Pt voiced understanding. Kiva is doing well in rehab. She was able to walk a full 30 laps on the track! She tried out the T5 Nustep for the first time at level 1, and would benefit from increasing to level 2. She does hit her THR most sessions in rehab. We will continue to  monitor.      Expected Outcomes Short: Continue to walk on days away from rehab. Long: Conitnue to exercise independently. Short: Increase to level 2 on the T5 Nustep Long: Continue to increase overall MET level and stamina               Discharge Exercise Prescription (Final Exercise Prescription Changes):  Exercise Prescription Changes - 02/04/23 0900       Response to Exercise   Blood Pressure (Admit) 102/60    Blood Pressure (Exit) 102/60    Heart Rate (Admit) 67 bpm    Heart Rate (Exercise) 93 bpm    Heart Rate (Exit) 82 bpm    Rating of Perceived Exertion (Exercise) 13    Symptoms none    Duration Continue with 30 min of aerobic exercise without signs/symptoms of physical distress.    Intensity THRR unchanged      Progression   Progression Continue to progress workloads to maintain intensity without signs/symptoms of physical distress.    Average METs 2.47      Resistance Training   Training Prescription Yes    Weight 3 lb    Reps 10-15      Interval Training   Interval Training No      NuStep   Level 3    Minutes 15    METs 2.8      T5 Nustep   Level 1    Minutes 15    METs 2.1      Biostep-RELP   Level 2    Minutes 15    METs 2      Track   Laps 30    Minutes 15    METs 2.63      Home Exercise Plan   Plans to continue exercise at [facility] (comment)   Pt walks at home and uses her personal 2 lb handweights for resistance training. She is also looking into joining a fitness center and expressed interest in the [place].   Frequency Add 2 additional days to program exercise sessions.    Initial Home Exercises Provided 01/23/23      Oxygen   Maintain Oxygen Saturation 88% or  higher             Nutrition:  Target Goals: Understanding of nutrition guidelines, daily intake of sodium <1541m, cholesterol <2055m calories 30% from fat and 7% or less from saturated fats, daily to have 5 or more servings of fruits and  vegetables.  Education: All About Nutrition: -Group instruction provided by verbal, written material, interactive activities, discussions, models, and posters to present general guidelines for heart healthy nutrition including fat, fiber, MyPlate, the role of sodium in heart healthy nutrition, utilization of the nutrition label, and utilization of this knowledge for meal planning. Follow up email sent as well. Written material given at graduation.   Biometrics:  Pre Biometrics - 12/12/22 1500       Pre Biometrics   Height 4' 10.5" (1.486 m)    Weight 136 lb 12.8 oz (62.1 kg)    Waist Circumference 37.5 inches    Hip Circumference 41 inches    Waist to Hip Ratio 0.91 %    BMI (Calculated) 28.1    Single Leg Stand 1.9 seconds              Nutrition Therapy Plan and Nutrition Goals:  Nutrition Therapy & Goals - 01/29/23 0902       Nutrition Therapy   Diet Heart healthy, low Na, T2DM MNT    Drug/Food Interactions Statins/Certain Fruits    Protein (specify units) 75g    Fiber 25 grams    Whole Grain Foods 3 servings    Saturated Fats 16 max. grams    Fruits and Vegetables 8 servings/day    Sodium 2 grams      Personal Nutrition Goals   Nutrition Goal ST: review paperwork, choose a CHO source to add for lunch and dinner (beans/lentils, sweet potato, fruit), include one different vegetable per week to add to salad. LT: maintain A1C <7, have 1 good protein source at each meal, include at least 1 serving of CHO at mealtime, increse food variety.    Comments [age].o. F admitted to cardiac rehab s/p STEMI with stents. PMHx includes CAD, T2DM, HTN, HLD. Most recent labs reviewed 01/29/23; latest A1C 7.2 as of 11/19/22. Relevant medications reviewed 01/29/23; xanax, lexapro, magnesium oxide, metformin, protonix, crestor. [place] trying to include more health promoting foods. B: avocado toast (seeded rye bread) with a boiled egg and unsweetened applesauce with coffee (2 equal or splenda  and cream). She loves low-salt tomato juice. L: salad (lettuce, cucumbers, tomatoes, celery, avocado with champagne dressing) with unsweet tea D: air fryer chicken and some vegetables with unsweet tea. [place] using smart balance butter. S: no-sugar cookies, nuts (no salt or no salt peanuts) Drinks: water. Discussed heart healthy eating and T2DM MNT.      Intervention Plan   Intervention Prescribe, educate and counsel regarding individualized specific dietary modifications aiming towards targeted core components such as weight, hypertension, lipid management, diabetes, heart failure and other comorbidities.    Expected Outcomes Short Term Goal: A plan has been developed with personal nutrition goals set during dietitian appointment.;Short Term Goal: Understand basic principles of dietary content, such as calories, fat, sodium, cholesterol and nutrients.;Long Term Goal: Adherence to prescribed nutrition plan.             Nutrition Assessments:  MEDIFICTS Score Key: ?70 Need to make dietary changes  40-70 Heart Healthy Diet ? 40 Therapeutic Level Cholesterol Diet  [facility]  Picture Your Plate  Total Score on Admission 61      Picture Your Plate Scores: [id number] Unhealthy dietary pattern with much room for improvement. 41-50 Dietary pattern unlikely to meet recommendations for good health and room for improvement. 51-60 More healthful dietary pattern, with some room for improvement.  >60 Healthy dietary pattern, although there may be some specific behaviors that could be improved.    Nutrition Goals Re-Evaluation:  Nutrition Goals Re-Evaluation     [place] Name 12/28/22 1128 01/23/23 1141           Goals   Current Weight -- 136 lb 6.4 oz (61.9 kg)      Nutrition Goal Heart Healthy diet Speak with RD about heart healthy diet.      Comment Yannet started while dietitian was out on leave.  She has already started to  make some changes to her diet.  She has cut back on carbs and sugar.  She never adds salt and buys low sodium.  Her biggest weakness is her tomato juice daily.  She is working on cutting back. Brailee expressed interest in speaking with the RD. She will schedule an appointment to speak with the RD about a heart healthy diet. She is still working on cutting back on carbs and sugar. She is still trying to cut back on her sodium content by not adding salt at the table and buying low sodium.      Expected Outcome Short; Continue to reduce sugar and salt Long: Set up appt with dietitian Short: Schedule an appointment with RD. Long: Continue to practice heart healthy eating patterns.               Nutrition Goals Discharge (Final Nutrition Goals Re-Evaluation):  Nutrition Goals Re-Evaluation - 01/23/23 1141       Goals   Current Weight 136 lb 6.4 oz (61.9 kg)    Nutrition Goal Speak with RD about heart healthy diet.    Comment Lonita expressed interest in speaking with the RD. She will schedule an appointment to speak with the RD about a heart healthy diet. She is still working on cutting back on carbs and sugar. She is still trying to cut back on her sodium content by not adding salt at the table and buying low sodium.    Expected Outcome Short: Schedule an appointment with RD. Long: Continue to practice heart healthy eating patterns.             Psychosocial: Target Goals: Acknowledge presence or absence of significant depression and/or stress, maximize coping skills, provide positive support system. Participant is able to verbalize types and ability to use techniques and skills needed for reducing stress and depression.   Education: Stress, Anxiety, and Depression - Group verbal and visual presentation to define topics covered.  Reviews how body is impacted by stress, anxiety, and depression.  Also discusses healthy ways to reduce stress and to treat/manage anxiety and depression.  Written  material given at graduation.   Education: Sleep Hygiene -Provides group verbal and written instruction about how sleep can affect your health.  Define sleep hygiene, discuss sleep cycles and impact of sleep habits. Review good sleep hygiene tips.    Initial Review & Psychosocial Screening:  Initial Psych Review & Screening - 11/28/22 1016       Initial Review   Current issues with Current Anxiety/Panic;Current Stress Concerns;History of Depression;Current Psychotropic Meds    Source of Stress Concerns Unable to participate in former interests or hobbies  Family Dynamics   Good Support System? Yes   daughter; church family     Barriers   Psychosocial barriers to participate in program There are no identifiable barriers or psychosocial needs.;The patient should benefit from training in stress management and relaxation.      Screening Interventions   Interventions Provide feedback about the scores to participant;Encouraged to exercise;To provide support and resources with identified psychosocial needs    Expected Outcomes Short Term goal: Utilizing psychosocial counselor, staff and physician to assist with identification of specific Stressors or current issues interfering with healing process. Setting desired goal for each stressor or current issue identified.;Long Term Goal: Stressors or current issues are controlled or eliminated.;Short Term goal: Identification and review with participant of any Quality of Life or Depression concerns found by scoring the questionnaire.;Long Term goal: The participant improves quality of Life and PHQ9 Scores as seen by post scores and/or verbalization of changes             Quality of Life Scores:   Quality of Life - 12/12/22 1457       Quality of Life   Select Quality of Life      Quality of Life Scores   Health/Function Pre 21.8 %    Socioeconomic Pre 24.38 %    Psych/Spiritual Pre 30 %    Family Pre 27 %    GLOBAL Pre 24.77 %             Scores of 19 and below usually indicate a poorer quality of life in these areas.  A difference of  2-3 points is a clinically meaningful difference.  A difference of 2-3 points in the total score of the Quality of Life Index has been associated with significant improvement in overall quality of life, self-image, physical symptoms, and general health in studies assessing change in quality of life.  PHQ-9: Review Flowsheet       01/11/2023 12/12/2022 05/30/2017  Depression screen PHQ 2/9  Decreased Interest 0 1 0  Down, Depressed, Hopeless 0 1 0  PHQ - 2 Score 0 2 0  Altered sleeping 0 0 -  Tired, decreased energy 1 3 -  Change in appetite 0 1 -  Feeling bad or failure about yourself  0 1 -  Trouble concentrating 0 1 -  Moving slowly or fidgety/restless 0 1 -  Suicidal thoughts 0 0 -  PHQ-9 Score 1 9 -  Difficult doing work/chores Not difficult at all Not difficult at all -   Interpretation of Total Score  Total Score Depression Severity:  1-4 = Minimal depression, 5-9 = Mild depression, 10-14 = Moderate depression, 15-19 = Moderately severe depression, 20-27 = Severe depression   Psychosocial Evaluation and Intervention:  Psychosocial Evaluation - 11/28/22 1026       Psychosocial Evaluation & Interventions   Interventions Encouraged to exercise with the program and follow exercise prescription    Comments Ms. 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 A few days after being discharged, she was readmitted with mini strokes. She states she has recovered fine and has no deficits. She has a great support system of her daughter and church family that help out whenever she needs it. She is sleeping well with her CPAP. She is on Lexapro after starting it for depression after her husbadn passed years ago. She said she felt better all around on it and has chosen to stay on it. When asked about stress, she mentioned she  tries not to stress about much. She is trying to make  sure she gets her health under control and is ready to start the program.    Expected Outcomes Short: attend cardiac rehab for education and exercise. Long: develop and maintain positive self care habits    Continue Psychosocial Services  Follow up required by staff             Psychosocial Re-Evaluation:  Psychosocial Re-Evaluation     [place] Name 12/28/22 1117 01/11/23 1124 01/23/23 1133         Psychosocial Re-Evaluation   Current issues with Current Stress Concerns Current Stress Concerns;Current Depression;History of Depression None Identified     Comments Yarethzy is doing well in rehab.  She helps serve with [employer] Whe[employer].  She is worried about a possible gout flare up in her big toe.  She continues to work on changing eating habits.  She usually sleeps well and it has gotten better since she has started to exercise more. Reviewed patient health questionnaire (PHQ-9) with patient for follow up. Previously, patients score indicated signs/symptoms of depression.  Reviewed to see if patient is improving symptom wise while in program.  Score improved and patient states that it is because they have been feeling better and getting out to exercise more.  She has come to terms with her meds not always making her feel her best. Pt denies any current stressors at this time. She states that she likes to read and socialize with others for stress relief. She states that exercise has been great for her mental state as well. She reports that she has a good support system around her made up by her family and church. She reports no concerns with her sleep at this time either.     Expected Outcomes Short: Conitnue to exercise for mental boost and sleep Long: conitnue to stay positive Short: Continue to attend [facility] regularly for regular exercise and social engagement. Long: Continue to improve symptoms and manage a positive mental state. Short: Continue to attend [facility] regularly for regular  exercise and social engagement. Long: Continue to improve symptoms and manage a positive mental state.     Interventions Encouraged to attend Cardiac Rehabilitation for the exercise;Stress management education Encouraged to attend Cardiac Rehabilitation for the exercise Encouraged to attend Cardiac Rehabilitation for the exercise     Continue Psychosocial Services  Follow up required by staff Follow up required by staff Follow up required by staff              Psychosocial Discharge (Final Psychosocial Re-Evaluation):  Psychosocial Re-Evaluation - 01/23/23 1133       Psychosocial Re-Evaluation   Current issues with None Identified    Comments Pt denies any current stressors at this time. She states that she likes to read and socialize with others for stress relief. She states that exercise has been great for her mental state as well. She reports that she has a good support system around her made up by her family and church. She reports no concerns with her sleep at this time either.    Expected Outcomes Short: Continue to attend [facility] regularly for regular exercise and social engagement. Long: Continue to improve symptoms and manage a positive mental state.    Interventions Encouraged to attend Cardiac Rehabilitation for the exercise    Continue Psychosocial Services  Follow up required by staff             Vocational Rehabilitation: Provide vocational  rehab assistance to qualifying candidates.   Vocational Rehab Evaluation & Intervention:  Vocational Rehab - 11/28/22 1016       Initial Vocational Rehab Evaluation & Intervention   Assessment shows need for Vocational Rehabilitation No             Education: Education Goals: Education classes will be provided on a variety of topics geared toward better understanding of heart health and risk factor modification. Participant will state understanding/return demonstration of topics presented as noted by education test  scores.  Learning Barriers/Preferences:  Learning Barriers/Preferences - 11/28/22 1016       Learning Barriers/Preferences   Learning Barriers None    Learning Preferences Individual Instruction             General Cardiac Education Topics:  AED/CPR: - Group verbal and written instruction with the use of models to demonstrate the basic use of the AED with the basic ABC's of resuscitation.   Anatomy and Cardiac Procedures: - Group verbal and visual presentation and models provide information about basic cardiac anatomy and function. Reviews the testing methods done to diagnose heart disease and the outcomes of the test results. Describes the treatment choices: Medical Management, Angioplasty, or Coronary Bypass Surgery for treating various heart conditions including Myocardial Infarction, Angina, Valve Disease, and Cardiac Arrhythmias.  Written material given at graduation. Flowsheet Row Cardiac Rehab from 01/30/2023 in [facility] Cardiac [facility] Rehab  Education need identified 12/12/22       Medication Safety: - Group verbal and visual instruction to review commonly prescribed medications for heart and lung disease. Reviews the medication, class of the drug, and side effects. Includes the steps to properly store meds and maintain the prescription regimen.  Written material given at graduation.   Intimacy: - Group verbal instruction through game format to discuss how heart and lung disease can affect sexual intimacy. Written material given at graduation.. Flowsheet Row Cardiac Rehab from 01/30/2023 in [facility] Cardiac and Pulmonary Rehab  Date 01/02/23  Educator [facility]  Instruction Review Code 1- Verbalizes Understanding       Know Your Numbers and Heart Failure: - Group verbal and visual instruction to discuss disease risk factors for cardiac and pulmonary disease and treatment options.  Reviews associated critical values for Overweight/Obesity, Hypertension, Cholesterol, and  Diabetes.  Discusses basics of heart failure: signs/symptoms and treatments.  Introduces Heart Failure Zone chart for action plan for heart failure.  Written material given at graduation.   Infection Prevention: - Provides verbal and written material to individual with discussion of infection control including proper hand washing and proper equipment cleaning during exercise session. Flowsheet Row Cardiac Rehab from 01/30/2023 in [facility] Cardiac and Pulmonary Rehab  Date 12/12/22  Educator NT  Instruction Review Code 1- Verbalizes Understanding       Falls Prevention: - Provides verbal and written material to individual with discussion of falls prevention and safety. Flowsheet Row Cardiac Rehab from 01/30/2023 in [facility] Cardiac and Pulmonary Rehab  Date 12/12/22  Educator NT  Instruction Review Code 1- Verbalizes Understanding       Other: -Provides group and verbal instruction on various topics (see comments) Flowsheet Row Cardiac Rehab from 01/30/2023 in [facility] Cardiac and Pulmonary Rehab  Date 01/30/23  Educator SB  Instruction Review Code 1- Verbalizes Understanding       Knowledge Questionnaire Score:  Knowledge Questionnaire Score - 12/12/22 1459       Knowledge Questionnaire Score   Pre Score 22/26  Core Components/Risk Factors/Patient Goals at Admission:  Personal Goals and Risk Factors at Admission - 12/12/22 1459       Core Components/Risk Factors/Patient Goals on Admission   Diabetes Yes    Intervention Provide education about signs/symptoms and action to take for hypo/hyperglycemia.;Provide education about proper nutrition, including hydration, and aerobic/resistive exercise prescription along with prescribed medications to achieve blood glucose in normal ranges: Fasting glucose 65-99 mg/dL    Expected Outcomes Short Term: Participant verbalizes understanding of the signs/symptoms and immediate care of hyper/hypoglycemia, proper foot care and  importance of medication, aerobic/resistive exercise and nutrition plan for blood glucose control.;Long Term: Attainment of HbA1C < 7%.    Hypertension Yes    Intervention Provide education on lifestyle modifcations including regular physical activity/exercise, weight management, moderate sodium restriction and increased consumption of fresh fruit, vegetables, and low fat dairy, alcohol moderation, and smoking cessation.;Monitor prescription use compliance.    Expected Outcomes Short Term: Continued assessment and intervention until BP is < 140/55m HG in hypertensive participants. < 130/884mHG in hypertensive participants with diabetes, heart failure or chronic kidney disease.;Long Term: Maintenance of blood pressure at goal levels.    Lipids Yes    Intervention Provide education and support for participant on nutrition & aerobic/resistive exercise along with prescribed medications to achieve LDL <7071mHDL >84m52m  Expected Outcomes Short Term: Participant states understanding of desired cholesterol values and is compliant with medications prescribed. Participant is following exercise prescription and nutrition guidelines.;Long Term: Cholesterol controlled with medications as prescribed, with individualized exercise RX and with personalized nutrition plan. Value goals: LDL < 70mg12mL > 40 mg.             Education:Diabetes - Individual verbal and written instruction to review signs/symptoms of diabetes, desired ranges of glucose level fasting, after meals and with exercise. Acknowledge that pre and post exercise glucose checks will be done for 3 sessions at entry of program. [facility] [facility]  Date 11/28/22  Educator [facility] P[facility]ruction Review Code 1- Verbalizes Understanding       Core Components/Risk Factors/Patient Goals Review:   Goals and Risk Factor Review     Row Name 12/28/22 1130 01/23/23 1135           Core  Components/Risk Factors/Patient Goals Review   Personal Goals Review Weight Management/Obesity;Diabetes;Hypertension Weight Management/Obesity;Diabetes;Hypertension      Review Morayma iTaziahff to a good start in rehab.  Her weight is already starting to come down some.  Her blood pressures are doing well and she checks them at home daily and keeps record for her doctor. She also checks her sugars twice a day at home. They have been doing okay but she knows they could be better. Erinn iTykeraoing well managing her risk factors. Sharman sAddelynees that she still wants to lose a little weight. Her current weight is 136.4 lb with a goal weight of 125 lb. Avyanna hChantallebeen checking her blood sugars at ome and reports that thye have been within normal ranges, but she is still working to improve them. Annaliese sLouisees that she has been checking her BP at home with her personal cuff every morning and night and reports that they are staying within normal ranges.      Expected Outcomes Short: Conitnue to work on managing her blood sugars Long: conitnue to montior risk factors. Short: Continue to work towards your weight goal with diet and exercise. Long: Continue  to monitor risk factors.               Core Components/Risk Factors/Patient Goals at Discharge (Final Review):   Goals and Risk Factor Review - 01/23/23 1135       Core Components/Risk Factors/Patient Goals Review   Personal Goals Review Weight Management/Obesity;Diabetes;Hypertension    Review Zaphira is doing well managing her risk factors. Falynn states that she still wants to lose a little weight. Her current weight is 136.4 lb with a goal weight of 125 lb. Pamlea has been checking her blood sugars at ome and reports that thye have been within normal ranges, but she is still working to improve them. Carola states that she has been checking her BP at home with her personal cuff every morning and night and reports that they are staying within normal ranges.    Expected Outcomes Short:  Continue to work towards your weight goal with diet and exercise. Long: Continue to monitor risk factors.             ITP Comments:  ITP Comments     Row Name 11/28/22 1024 12/12/22 1456 12/21/22 1153 01/09/23 0855 01/29/23 0902   ITP Comments Initial phone call completed. Diagnosis can be found in [facility] 12/4. EP Orientation scheduled for Wednesday 12/27 at 1:30. Completed 6MWT and gym orientation. Initial ITP created and sent for review to Dr. Emily Filbert, Medical Director. First full day of exercise!  Patient was oriented to gym and equipment including functions, settings, policies, and procedures.  Patient's individual exercise prescription and treatment plan were reviewed.  All starting workloads were established based on the results of the 6 minute walk test done at initial orientation visit.  The plan for exercise progression was also introduced and progression will be customized based on patient's performance and goals. 30 Day review completed. Medical Director ITP review done, changes made as directed, and signed approval by Medical Director.   new to program Completed initial RD consultation    [place] Name 02/06/23 0750           ITP Comments 30 day review completed. ITP sent to Dr. Emily Filbert, Medical Director of Cardiac Rehab. Continue with ITP unless changes are made by physician.                Comments: 30 day review

## 2023-02-06 NOTE — Progress Notes (Signed)
Daily Session Note  Patient Details  Name: Vanessa Young MRN: [id number] Date of Birth: Jul 26, 1941 Referring Provider:   Flowsheet Row Cardiac Rehab from 12/12/2022 in [facility] Cardiac and Pulmonary Rehab  Referring Provider Kathlyn Sacramento MD       Encounter Date: 02/06/2023  Check In:  Session Check In - 02/06/23 1041       Check-In   Supervising physician immediately available to respond to emergencies See telemetry face sheet for immediately available ER MD    Location [facility]-Cardiac & Pulmonary Rehab    Staff Present Renita Papa, RN Odelia Gage, RN, Dimple Nanas, BS, Exercise Physiologist;Joseph Tessie Fass, [place]    Virtual Visit No    Medication changes reported     No    Fall or balance concerns reported    No    Warm-up and Cool-down Performed on first and last piece of equipment    Resistance Training Performed Yes    VAD Patient? No    PAD/SET Patient? No      Pain Assessment   Currently in Pain? No/denies                Social History   Tobacco Use  Smoking Status Former   Packs/day: 0.25   Years: 20.00   Total pack years: 5.00   Types: Cigarettes   Quit date: 2002   Years since quitting: 22.1  Smokeless Tobacco Never    Goals Met:  Independence with exercise equipment Exercise tolerated well No report of concerns or symptoms today Strength training completed today  Goals Unmet:  Not Applicable  Comments: Pt able to follow exercise prescription today without complaint.  Will continue to monitor for progression.    Dr. Emily Filbert is Medical Director for [place].  Dr. Ottie Glazier is Medical Director for [facility] Pulmonary Rehabilitation.

## 2023-02-07 ENCOUNTER — Other Ambulatory Visit: Payer: Self-pay | Admitting: *Deleted

## 2023-02-07 DIAGNOSIS — E785 Hyperlipidemia, unspecified: Secondary | ICD-10-CM

## 2023-02-07 DIAGNOSIS — Z8673 Personal history of transient ischemic attack (TIA), and cerebral infarction without residual deficits: Secondary | ICD-10-CM

## 2023-02-07 DIAGNOSIS — I2510 Atherosclerotic heart disease of native coronary artery without angina pectoris: Secondary | ICD-10-CM

## 2023-02-07 DIAGNOSIS — I2111 ST elevation (STEMI) myocardial infarction involving right coronary artery: Secondary | ICD-10-CM

## 2023-02-07 MED ORDER — ROSUVASTATIN CALCIUM 20 MG PO TABS: 20.0000 mg | ORAL_TABLET | Freq: Every day | ORAL | 3 refills | Status: DC

## 2023-02-08 ENCOUNTER — Encounter: Payer: Medicare PPO | Admitting: *Deleted

## 2023-02-08 DIAGNOSIS — Z955 Presence of coronary angioplasty implant and graft: Secondary | ICD-10-CM

## 2023-02-08 DIAGNOSIS — I213 ST elevation (STEMI) myocardial infarction of unspecified site: Secondary | ICD-10-CM

## 2023-02-08 DIAGNOSIS — Z5189 Encounter for other specified aftercare: Secondary | ICD-10-CM | POA: Diagnosis not present

## 2023-02-08 NOTE — Progress Notes (Signed)
Daily Session Note  Patient Details  Name: RONEE RISHER MRN: [id number] Date of Birth: Aug 10, 1941 Referring Provider:   Flowsheet Row Cardiac Rehab from 12/12/2022 in [facility] Cardiac and Pulmonary Rehab  Referring Provider Kathlyn Sacramento MD       Encounter Date: 02/08/2023  Check In:  Session Check In - 02/08/23 1132       Check-In   Supervising physician immediately available to respond to emergencies See telemetry face sheet for immediately available ER MD    Location [facility]-Cardiac & Pulmonary Rehab    Staff Present Alberteen Sam, MA, RCEP, CCRP, CCET;Joseph Trenton, RCP,RRT,BSRT;Other   Darel Hong, RN BSN   Virtual Visit No    Medication changes reported     Yes    Comments added crestor '20mg'$  back to medications    Fall or balance concerns reported    No    Tobacco Cessation No Change    Warm-up and Cool-down Performed on first and last piece of equipment    Resistance Training Performed Yes    VAD Patient? No    PAD/SET Patient? No      Pain Assessment   Currently in Pain? No/denies                Social History   Tobacco Use  Smoking Status Former   Packs/day: 0.25   Years: 20.00   Total pack years: 5.00   Types: Cigarettes   Quit date: 2002   Years since quitting: 22.1  Smokeless Tobacco Never    Goals Met:  Independence with exercise equipment Exercise tolerated well No report of concerns or symptoms today Strength training completed today  Goals Unmet:  Not Applicable  Comments: Pt able to follow exercise prescription today without complaint.  Will continue to monitor for progression.    Dr. Emily Filbert is Medical Director for [place].  Dr. Ottie Glazier is Medical Director for [facility] Pulmonary Rehabilitation.

## 2023-02-11 ENCOUNTER — Encounter: Payer: Medicare PPO | Admitting: *Deleted

## 2023-02-11 DIAGNOSIS — Z5189 Encounter for other specified aftercare: Secondary | ICD-10-CM | POA: Diagnosis not present

## 2023-02-11 DIAGNOSIS — I213 ST elevation (STEMI) myocardial infarction of unspecified site: Secondary | ICD-10-CM

## 2023-02-11 DIAGNOSIS — Z955 Presence of coronary angioplasty implant and graft: Secondary | ICD-10-CM

## 2023-02-11 NOTE — Progress Notes (Signed)
Daily Session Note  Patient Details  Name: Vanessa Young MRN: [id number] Date of Birth: 05/11/1941 Referring Provider:   Flowsheet Row Cardiac Rehab from 12/12/2022 in [facility] Cardiac and Pulmonary Rehab  Referring Provider Kathlyn Sacramento MD       Encounter Date: 02/11/2023  Check In:  Session Check In - 02/11/23 1119       Check-In   Supervising physician immediately available to respond to emergencies See telemetry face sheet for immediately available ER MD    Location [facility]-Cardiac & Pulmonary Rehab    Staff Present Darlyne Russian, RN, Doyce Para, BS, ACSM CEP, Exercise Physiologist;Kara Maricela Bo, MS, ACSM CEP, Exercise Physiologist;Noah Tickle, BS, Exercise Physiologist    Virtual Visit No    Medication changes reported     No    Fall or balance concerns reported    No    Warm-up and Cool-down Performed on first and last piece of equipment    Resistance Training Performed Yes    VAD Patient? No    PAD/SET Patient? No      Pain Assessment   Currently in Pain? No/denies                Social History   Tobacco Use  Smoking Status Former   Packs/day: 0.25   Years: 20.00   Total pack years: 5.00   Types: Cigarettes   Quit date: 2002   Years since quitting: 22.1  Smokeless Tobacco Never    Goals Met:  Independence with exercise equipment Exercise tolerated well No report of concerns or symptoms today Strength training completed today  Goals Unmet:  Not Applicable  Comments: Pt able to follow exercise prescription today without complaint.  Will continue to monitor for progression.    Dr. Emily Filbert is Medical Director for [place].  Dr. Ottie Glazier is Medical Director for [facility] Pulmonary Rehabilitation.

## 2023-02-13 ENCOUNTER — Encounter: Payer: Medicare PPO | Admitting: *Deleted

## 2023-02-15 ENCOUNTER — Encounter: Payer: Medicare PPO | Attending: Cardiovascular Disease | Admitting: *Deleted

## 2023-02-15 DIAGNOSIS — I213 ST elevation (STEMI) myocardial infarction of unspecified site: Secondary | ICD-10-CM | POA: Diagnosis present

## 2023-02-15 DIAGNOSIS — I252 Old myocardial infarction: Secondary | ICD-10-CM | POA: Diagnosis not present

## 2023-02-15 DIAGNOSIS — Z955 Presence of coronary angioplasty implant and graft: Secondary | ICD-10-CM | POA: Diagnosis present

## 2023-02-15 DIAGNOSIS — Z48812 Encounter for surgical aftercare following surgery on the circulatory system: Secondary | ICD-10-CM | POA: Diagnosis not present

## 2023-02-15 NOTE — Progress Notes (Signed)
Daily Session Note  Patient Details  Name: Vanessa Young MRN: [id number] Date of Birth: [birth year] Referring Provider:   Flowsheet Row Cardiac Rehab from 12/12/2022 in [facility] Cardiac and Pulmonary Rehab  Referring Provider Kathlyn Sacramento MD       Encounter Date: 02/15/2023  Check In:  Session Check In - 02/15/23 1127       Check-In   Supervising physician immediately available to respond to emergencies See telemetry face sheet for immediately available ER MD    Location [facility]Cardiac & Pulmonary Rehab    Staff Present Darlyne Russian, RN, ADN;Jessica Luan Pulling, MA, RCEP, CCRP, CCET;Joseph [place], [place]    Virtual Visit No    Medication changes reported     No    Fall or balance concerns reported    No    Warm-up and Cool-down Performed on first and last piece of equipment    Resistance Training Performed Yes    VAD Patient? No    PAD/SET Patient? No      Pain Assessment   Currently in Pain? No/denies                Social History   Tobacco Use  Smoking Status Former   Packs/day: 0.25   Years: 20.00   Total pack years: 5.00   Types: Cigarettes   Quit date: 2002   Years since quitting: 22.1  Smokeless Tobacco Never    Goals Met:  Independence with exercise equipment Exercise tolerated well No report of concerns or symptoms today Strength training completed today  Goals Unmet:  Not Applicable  Comments: Pt able to follow exercise prescription today without complaint.  Will continue to monitor for progression.    Dr. Emily Filbert is Medical Director for [place].  Dr. Ottie Glazier is Medical Director for [facility] Pulmonary Rehabilitation.

## 2023-02-18 ENCOUNTER — Encounter: Payer: Medicare PPO | Admitting: *Deleted

## 2023-02-18 VITALS — Ht 58.5 in | Wt 136.7 lb

## 2023-02-18 DIAGNOSIS — I252 Old myocardial infarction: Secondary | ICD-10-CM | POA: Diagnosis not present

## 2023-02-18 DIAGNOSIS — I213 ST elevation (STEMI) myocardial infarction of unspecified site: Secondary | ICD-10-CM

## 2023-02-18 DIAGNOSIS — Z955 Presence of coronary angioplasty implant and graft: Secondary | ICD-10-CM

## 2023-02-18 NOTE — Patient Instructions (Signed)
Discharge Patient Instructions  Patient Details  Name: Vanessa Young MRN: [id number] Date of Birth: Sep 26, 1941 Referring Provider:  Myrtie Soman, MD   Number of Visits: 84  Reason for Discharge:  Patient reached a stable level of exercise. Patient independent in their exercise. Patient has met program and personal goals.  Diagnosis:  ST elevation myocardial infarction (STEMI), unspecified artery (HCC)  Status post coronary artery stent placement  Initial Exercise Prescription:  Initial Exercise Prescription - 12/12/22 1500       Date of Initial Exercise RX and Referring Provider   Date 12/12/22    Referring Provider Kathlyn Sacramento MD      Oxygen   Maintain Oxygen Saturation 88% or higher      Treadmill   MPH 1.6    Grade 0    Minutes 15    METs 2.23      NuStep   Level 2    SPM 80    Minutes 1.39    METs 1.39      Biostep-RELP   Level 1    SPM 50    Minutes 15    METs 1.39      Track   Laps 18    Minutes 15    METs 1.98      Prescription Details   Frequency (times per week) 3    Duration Progress to 30 minutes of continuous aerobic without signs/symptoms of physical distress      Intensity   THRR 40-80% of Max Heartrate 88-121    Ratings of Perceived Exertion 11-13    Perceived Dyspnea 0-4      Progression   Progression Continue to progress workloads to maintain intensity without signs/symptoms of physical distress.      Resistance Training   Training Prescription Yes    Weight 2 lb    Reps 10-15             Discharge Exercise Prescription (Final Exercise Prescription Changes):  Exercise Prescription Changes - 02/04/23 0900       Response to Exercise   Blood Pressure (Admit) 102/60    Blood Pressure (Exit) 102/60    Heart Rate (Admit) 67 bpm    Heart Rate (Exercise) 93 bpm    Heart Rate (Exit) 82 bpm    Rating of Perceived Exertion (Exercise) 13    Symptoms none    Duration Continue with 30 min of aerobic exercise without  signs/symptoms of physical distress.    Intensity THRR unchanged      Progression   Progression Continue to progress workloads to maintain intensity without signs/symptoms of physical distress.    Average METs 2.47      Resistance Training   Training Prescription Yes    Weight 3 lb    Reps 10-15      Interval Training   Interval Training No      NuStep   Level 3    Minutes 15    METs 2.8      T5 Nustep   Level 1    Minutes 15    METs 2.1      Biostep-RELP   Level 2    Minutes 15    METs 2      Track   Laps 30    Minutes 15    METs 2.63      Home Exercise Plan   Plans to continue exercise at [employer] (comment)   Pt walks at home and uses her personal 2  lb handweights for resistance training. She is also looking into joining a fitness center and expressed interest in the [place].   Frequency Add 2 additional days to program exercise sessions.    Initial Home Exercises Provided 01/23/23      Oxygen   Maintain Oxygen Saturation 88% or higher             Functional Capacity:  6 Minute Walk     Row Name 12/12/22 1506 02/18/23 1119       6 Minute Walk   Phase Initial Discharge    Distance 955 feet 1170 feet    Distance % Change -- 22.5 %    Distance Feet Change -- 215 ft    Walk Time 6 minutes 6 minutes    # of Rest Breaks 0 0    MPH 1.81 2.22    METS 1.39 1.62    RPE 12 12    Perceived Dyspnea  0 0    VO2 Peak 4.87 5.69    Symptoms Yes (comment) No    Comments leg fatigue --    Resting HR 55 bpm 63 bpm    Resting BP 124/66 112/60    Resting Oxygen Saturation  98 % 95 %    Exercise Oxygen Saturation  during 6 min walk 96 % 92 %    Max Ex. HR 55 bpm 94 bpm    Max Ex. BP 134/66 120/64    2 Minute Post BP 122/64 --             Nutrition & Weight - Outcomes:  Pre Biometrics - 12/12/22 1500       Pre Biometrics   Height 4' 10.5" (1.486 m)    Weight 136 lb 12.8 oz (62.1 kg)    Waist Circumference 37.5 inches    Hip Circumference  41 inches    Waist to Hip Ratio 0.91 %    BMI (Calculated) 28.1    Single Leg Stand 1.9 seconds             Post Biometrics - 02/18/23 1126        Post  Biometrics   Height 4' 10.5" (1.486 m)    Weight 136 lb 11.2 oz (62 kg)    Waist Circumference 35.5 inches    Hip Circumference 39 inches    Waist to Hip Ratio 0.91 %    BMI (Calculated) 28.08    Single Leg Stand 2.9 seconds             Nutrition:  Nutrition Therapy & Goals - 01/29/23 0902       Nutrition Therapy   Diet Heart healthy, low Na, T2DM MNT    Drug/Food Interactions Statins/Certain Fruits    Protein (specify units) 75g    Fiber 25 grams    Whole Grain Foods 3 servings    Saturated Fats 16 max. grams    Fruits and Vegetables 8 servings/day    Sodium 2 grams      Personal Nutrition Goals   Nutrition Goal ST: review paperwork, choose a CHO source to add for lunch and dinner (beans/lentils, sweet potato, fruit), include one different vegetable per week to add to salad. LT: maintain A1C <7, have 1 good protein source at each meal, include at least 1 serving of CHO at mealtime, increse food variety.    Comments 82 y.o. F admitted to cardiac rehab s/p STEMI with stents. PMHx includes CAD, T2DM, HTN, HLD. Most recent labs reviewed 01/29/23; latest  A1C 7.2 as of 11/19/22. Relevant medications reviewed 01/29/23; xanax, lexapro, magnesium oxide, metformin, protonix, crestor. Vanessa Young reports trying to include more health promoting foods. B: avocado toast (seeded rye bread) with a boiled egg and unsweetened applesauce with coffee (2 equal or splenda and cream). She loves low-salt tomato juice. L: salad (lettuce, cucumbers, tomatoes, celery, avocado with champagne dressing) with unsweet tea D: air fryer chicken and some vegetables with unsweet tea. Vanessa Young reports using smart balance butter. S: no-sugar cookies, nuts (no salt or no salt peanuts) Drinks: water. Discussed heart healthy eating and T2DM MNT.      Intervention Plan    Intervention Prescribe, educate and counsel regarding individualized specific dietary modifications aiming towards targeted core components such as weight, hypertension, lipid management, diabetes, heart failure and other comorbidities.    Expected Outcomes Short Term Goal: A plan has been developed with personal nutrition goals set during dietitian appointment.;Short Term Goal: Understand basic principles of dietary content, such as calories, fat, sodium, cholesterol and nutrients.;Long Term Goal: Adherence to prescribed nutrition plan.

## 2023-02-18 NOTE — Progress Notes (Signed)
Daily Session Note  Patient Details  Name: Vanessa Young MRN: [id number] Date of Birth: January 04, 1941 Referring Provider:   Flowsheet Row Cardiac Rehab from 12/12/2022 in [facility] Cardiac and Pulmonary Rehab  Referring Provider Kathlyn Sacramento MD       Encounter Date: 02/18/2023  Check In:  Session Check In - 02/18/23 1120       Check-In   Supervising physician immediately available to respond to emergencies See telemetry face sheet for immediately available ER MD    Location [facility]-Cardiac & Pulmonary Rehab    Staff Present Darlyne Russian, RN, Doyce Para, BS, ACSM CEP, Exercise Physiologist;Noah Tickle, BS, Exercise Physiologist    Virtual Visit No    Medication changes reported     No    Fall or balance concerns reported    No    Warm-up and Cool-down Performed on first and last piece of equipment    Resistance Training Performed Yes    VAD Patient? No    PAD/SET Patient? No      Pain Assessment   Currently in Pain? No/denies                Social History   Tobacco Use  Smoking Status Former   Packs/day: 0.25   Years: 20.00   Total pack years: 5.00   Types: Cigarettes   Quit date: 2002   Years since quitting: 22.1  Smokeless Tobacco Never    Goals Met:  Independence with exercise equipment Exercise tolerated well No report of concerns or symptoms today Strength training completed today  Goals Unmet:  Not Applicable  Comments: Pt able to follow exercise prescription today without complaint.  Will continue to monitor for progression.   [place] Name 12/12/22 1506 02/18/23 1119       6 Minute Walk   Phase Initial Discharge    Distance 955 feet 1170 feet    Distance % Change -- 22.5 %    Distance Feet Change -- 215 ft    Walk Time 6 minutes 6 minutes    # of Rest Breaks 0 0    MPH 1.81 2.22    METS 1.39 1.62    RPE 12 12    Perceived Dyspnea  0 0    VO2 Peak 4.87 5.69    Symptoms Yes (comment) No    Comments leg fatigue --    Resting  HR 55 bpm 63 bpm    Resting BP 124/66 112/60    Resting Oxygen Saturation  98 % 95 %    Exercise Oxygen Saturation  during 6 min walk 96 % 92 %    Max Ex. HR 55 bpm 94 bpm    Max Ex. BP 134/66 120/64    2 Minute Post BP 122/64 --                Dr. Emily Filbert is Medical Director for [place].  Dr. Ottie Glazier is Medical Director for [facility] Pulmonary Rehabilitation.

## 2023-02-20 ENCOUNTER — Encounter: Payer: Medicare PPO | Admitting: *Deleted

## 2023-02-20 DIAGNOSIS — I213 ST elevation (STEMI) myocardial infarction of unspecified site: Secondary | ICD-10-CM

## 2023-02-20 DIAGNOSIS — Z955 Presence of coronary angioplasty implant and graft: Secondary | ICD-10-CM

## 2023-02-20 DIAGNOSIS — I252 Old myocardial infarction: Secondary | ICD-10-CM | POA: Diagnosis not present

## 2023-02-20 NOTE — Progress Notes (Signed)
Daily Session Note  Patient Details  Name: Vanessa Young MRN: [id number] Date of Birth: 11/16/1941 Referring Provider:   Flowsheet Row Cardiac Rehab from 12/12/2022 in [facility] Cardiac and Pulmonary Rehab  Referring Provider Kathlyn Sacramento MD       Encounter Date: 02/20/2023  Check In:  Session Check In - 02/20/23 1355       Check-In   Supervising physician immediately available to respond to emergencies See telemetry face sheet for immediately available ER MD    Location [facility]-Cardiac & Pulmonary Rehab    Staff Present Renita Papa, RN Moises Blood, BS, ACSM CEP, Exercise Physiologist;Megan Tamala Julian, RN, ADN    Virtual Visit No    Medication changes reported     No    Fall or balance concerns reported    No    Warm-up and Cool-down Performed on first and last piece of equipment    Resistance Training Performed Yes    VAD Patient? No    PAD/SET Patient? No      Pain Assessment   Currently in Pain? No/denies                Social History   Tobacco Use  Smoking Status Former   Packs/day: 0.25   Years: 20.00   Total pack years: 5.00   Types: Cigarettes   Quit date: 2002   Years since quitting: 22.1  Smokeless Tobacco Never    Goals Met:  Independence with exercise equipment Exercise tolerated well No report of concerns or symptoms today Strength training completed today  Goals Unmet:  Not Applicable  Comments: Pt able to follow exercise prescription today without complaint.  Will continue to monitor for progression.    Dr. Emily Filbert is Medical Director for [place].  Dr. Ottie Glazier is Medical Director for [facility] Pulmonary Rehabilitation.

## 2023-02-22 DIAGNOSIS — I2510 Atherosclerotic heart disease of native coronary artery without angina pectoris: Secondary | ICD-10-CM

## 2023-02-22 DIAGNOSIS — Z79899 Other long term (current) drug therapy: Secondary | ICD-10-CM

## 2023-02-25 ENCOUNTER — Encounter: Payer: Medicare PPO | Admitting: *Deleted

## 2023-02-25 DIAGNOSIS — Z955 Presence of coronary angioplasty implant and graft: Secondary | ICD-10-CM

## 2023-02-25 DIAGNOSIS — I213 ST elevation (STEMI) myocardial infarction of unspecified site: Secondary | ICD-10-CM

## 2023-02-25 DIAGNOSIS — I252 Old myocardial infarction: Secondary | ICD-10-CM | POA: Diagnosis not present

## 2023-02-25 NOTE — Progress Notes (Signed)
Daily Session Note  Patient Details  Name: Vanessa Young MRN: [id number] Date of Birth: [birth year] Referring Provider:   Flowsheet Row Cardiac Rehab from 12/12/2022 in [facility] Cardiac and Pulmonary Rehab  Referring Provider Kathlyn Sacramento MD       Encounter Date: 02/25/2023  Check In:  Session Check In - 02/25/23 1116       Check-In   Supervising physician immediately available to respond to emergencies See telemetry face sheet for immediately available ER MD    Location [facility]-Cardiac & Pulmonary Rehab    Staff Present Darlyne Russian, RN, Doyce Para, BS, ACSM CEP, Exercise Physiologist;Meredith Sherryll Burger, RN BSN;Noah Tickle, BS, Exercise Physiologist    Virtual Visit No    Medication changes reported     No    Fall or balance concerns reported    No    Warm-up and Cool-down Performed on first and last piece of equipment    Resistance Training Performed Yes    VAD Patient? No    PAD/SET Patient? No      Pain Assessment   Currently in Pain? No/denies                Social History   Tobacco Use  Smoking Status Former   Packs/day: 0.25   Years: 20.00   Total pack years: 5.00   Types: Cigarettes   Quit date: 2002   Years since quitting: 22.2  Smokeless Tobacco Never    Goals Met:  Independence with exercise equipment Exercise tolerated well No report of concerns or symptoms today Strength training completed today  Goals Unmet:  Not Applicable  Comments: Pt able to follow exercise prescription today without complaint.  Will continue to monitor for progression.    Dr. Emily Filbert is Medical Director for [place].  Dr. Ottie Glazier is Medical Director for [facility] Pulmonary Rehabilitation.

## 2023-03-01 ENCOUNTER — Encounter: Payer: Medicare PPO | Admitting: *Deleted

## 2023-03-01 DIAGNOSIS — Z955 Presence of coronary angioplasty implant and graft: Secondary | ICD-10-CM

## 2023-03-01 DIAGNOSIS — I213 ST elevation (STEMI) myocardial infarction of unspecified site: Secondary | ICD-10-CM

## 2023-03-01 DIAGNOSIS — I252 Old myocardial infarction: Secondary | ICD-10-CM | POA: Diagnosis not present

## 2023-03-01 NOTE — Progress Notes (Signed)
Daily Session Note  Patient Details  Name: Vanessa Young MRN: [id number] Date of Birth: [birth year] Referring Provider:   Flowsheet Row Cardiac Rehab from 12/12/2022 in [facility] Cardiac and Pulmonary Rehab  Referring Provider Kathlyn Sacramento MD       Encounter Date: 03/01/2023  Check In:  Session Check In - 03/01/23 1207       Check-In   Supervising physician immediately available to respond to emergencies See telemetry face sheet for immediately available ER MD    Location [facility]-Cardiac & Pulmonary Rehab    Staff Present Heath Lark, RN, BSN, CCRP;Jessica [place], MA, RCEP, CCRP, CCET;Joseph [place], [place]    Virtual Visit No    Medication changes reported     No    Fall or balance concerns reported    No    Warm-up and Cool-down Performed on first and last piece of equipment    Resistance Training Performed Yes    VAD Patient? No    PAD/SET Patient? No      Pain Assessment   Currently in Pain? No/denies                Social History   Tobacco Use  Smoking Status Former   Packs/day: 0.25   Years: 20.00   Additional pack years: 0.00   Total pack years: 5.00   Types: Cigarettes   Quit date: 2002   Years since quitting: 22.2  Smokeless Tobacco Never    Goals Met:  Independence with exercise equipment Exercise tolerated well No report of concerns or symptoms today  Goals Unmet:  Not Applicable  Comments: Pt able to follow exercise prescription today without complaint.  Will continue to monitor for progression.    Dr. Emily Filbert is Medical Director for [place].  Dr. Ottie Glazier is Medical Director for [facility] Pulmonary Rehabilitation.

## 2023-03-04 ENCOUNTER — Encounter: Payer: Medicare PPO | Admitting: *Deleted

## 2023-03-04 DIAGNOSIS — I213 ST elevation (STEMI) myocardial infarction of unspecified site: Secondary | ICD-10-CM

## 2023-03-04 DIAGNOSIS — I252 Old myocardial infarction: Secondary | ICD-10-CM | POA: Diagnosis not present

## 2023-03-04 DIAGNOSIS — Z955 Presence of coronary angioplasty implant and graft: Secondary | ICD-10-CM

## 2023-03-04 MED ORDER — CHLORTHALIDONE 25 MG PO TABS: 12.5000 mg | ORAL_TABLET | Freq: Every day | ORAL | 3 refills | Status: DC

## 2023-03-04 NOTE — Progress Notes (Signed)
Discharge Summary: Vanessa Young (DOB: [birth year])  [name] graduated today from  rehab with 36 sessions completed.  Details of the patient's exercise prescription and what She needs to do in order to continue the prescription and progress were discussed with patient.  Patient was given a copy of prescription and goals.  Patient verbalized understanding.  [name] plans to continue to exercise by walking at home and using her personal 2 lb handweights for resistance training. She is also looking into joining a fitness center and expressed interest in the [place]. .   6 Minute Walk     Row Name 12/12/22 1506 02/18/23 1119       6 Minute Walk   Phase Initial Discharge    Distance 955 feet 1170 feet    Distance % Change -- 22.5 %    Distance Feet Change -- 215 ft    Walk Time 6 minutes 6 minutes    # of Rest Breaks 0 0    MPH 1.81 2.22    METS 1.39 1.62    RPE 12 12    Perceived Dyspnea  0 0    VO2 Peak 4.87 5.69    Symptoms Yes (comment) No    Comments leg fatigue --    Resting HR 55 bpm 63 bpm    Resting BP 124/66 112/60    Resting Oxygen Saturation  98 % 95 %    Exercise Oxygen Saturation  during 6 min walk 96 % 92 %    Max Ex. HR 55 bpm 94 bpm    Max Ex. BP 134/66 120/64    2 Minute Post BP 122/64 --

## 2023-03-04 NOTE — Progress Notes (Signed)
Daily Session Note  Patient Details  Name: Vanessa Young MRN: [id number] Date of Birth: May 20, 1941 Referring Provider:   Flowsheet Row Cardiac Rehab from 12/12/2022 in [facility] Cardiac and Pulmonary Rehab  Referring Provider Kathlyn Sacramento MD       Encounter Date: 03/04/2023  Check In:  Session Check In - 03/04/23 1139       Check-In   Supervising physician immediately available to respond to emergencies See telemetry face sheet for immediately available ER MD    Location [facility]-Cardiac & Pulmonary Rehab    Staff Present Darlyne Russian, RN, Doyce Para, BS, ACSM CEP, Exercise Physiologist;Meredith Sherryll Burger, RN BSN;Noah Tickle, BS, Exercise Physiologist    Virtual Visit No    Medication changes reported     No    Fall or balance concerns reported    No    Warm-up and Cool-down Performed on first and last piece of equipment    Resistance Training Performed Yes    VAD Patient? No    PAD/SET Patient? No      Pain Assessment   Currently in Pain? No/denies                Social History   Tobacco Use  Smoking Status Former   Packs/day: 0.25   Years: 20.00   Additional pack years: 0.00   Total pack years: 5.00   Types: Cigarettes   Quit date: 2002   Years since quitting: 22.2  Smokeless Tobacco Never    Goals Met:  Independence with exercise equipment Exercise tolerated well No report of concerns or symptoms today Strength training completed today  Goals Unmet:  Not Applicable  Comments:  [name] graduated today from  rehab with 36 sessions completed.  Details of the patient's exercise prescription and what She needs to do in order to continue the prescription and progress were discussed with patient.  Patient was given a copy of prescription and goals.  Patient verbalized understanding.  [name] plans to continue to exercise by walking at home and using her personal 2 lb handweights for resistance training. She is also looking into joining a fitness center and expressed  interest in the [place]. .    Dr. Emily Filbert is Medical Director for [place].  Dr. Ottie Glazier is Medical Director for [facility] Pulmonary Rehabilitation.

## 2023-03-04 NOTE — Progress Notes (Signed)
Cardiac Individual Treatment Plan  Patient Details  Name: BRESHAWN CITRANO MRN: [id number] Date of Birth: [birth year] Referring Provider:   Flowsheet Row Cardiac Rehab from 12/12/2022 in [facility] Cardiac and Pulmonary Rehab  Referring Provider Kathlyn Sacramento MD       Initial Encounter Date:  Flowsheet Row Cardiac Rehab from 12/12/2022 in [facility] Cardiac and Pulmonary Rehab  Date 12/12/22       Visit Diagnosis: ST elevation myocardial infarction (STEMI), unspecified artery ([place])  Status post coronary artery stent placement  Patient's Home Medications on Admission:  Current Outpatient Medications:    ACCU-CHEK GUIDE test strip, , Disp: , Rfl:    ALPRAZolam (XANAX) 0.25 MG tablet, Take 0.25 mg by mouth at bedtime as needed for sleep., Disp: , Rfl:    apixaban (ELIQUIS) 5 MG TABS tablet, Take 1 tablet (5 mg total) by mouth 2 (two) times daily., Disp: 60 tablet, Rfl: 2   carvedilol (COREG) 12.5 MG tablet, Take 1 tablet (12.5 mg total) by mouth 2 (two) times daily., Disp: 180 tablet, Rfl: 3   chlorthalidone (HYGROTON) 25 MG tablet, Take 0.5 tablets (12.5 mg total) by mouth daily., Disp: 45 tablet, Rfl: 3   clopidogrel (PLAVIX) 75 MG tablet, Take 1 tablet (75 mg total) by mouth daily., Disp: 30 tablet, Rfl: 2   escitalopram (LEXAPRO) 10 MG tablet, Take 10 mg by mouth daily., Disp: , Rfl:    guanFACINE (TENEX) 1 MG tablet, Take 1 mg by mouth 2 (two) times daily.  (Patient not taking: Reported on 01/22/2023), Disp: , Rfl:    losartan (COZAAR) 100 MG tablet, Take 1 tablet (100 mg total) by mouth daily., Disp: 30 tablet, Rfl: 2   magnesium oxide (MAG-OX) 400 (240 Mg) MG tablet, Take 1 tablet by mouth daily., Disp: , Rfl:    metFORMIN (GLUMETZA) 500 MG (MOD) 24 hr tablet, Take 500 mg by mouth 2 (two) times daily with a meal., Disp: , Rfl:    pantoprazole (PROTONIX) 40 MG tablet, Take 1 tablet (40 mg total) by mouth daily., Disp: 90 tablet, Rfl: 1   rosuvastatin (CRESTOR) 20 MG tablet, Take 1 tablet (20 mg  total) by mouth daily., Disp: 90 tablet, Rfl: 3  Past Medical History: Past Medical History:  Diagnosis Date   A-fib ([place])    Depression    Diabetes mellitus without complication (HCC)    GERD (gastroesophageal reflux disease)    Hypercholesteremia    Hypertension     Tobacco Use: Social History   Tobacco Use  Smoking Status Former   Packs/day: 0.25   Years: 20.00   Additional pack years: 0.00   Total pack years: 5.00   Types: Cigarettes   Quit date: 2002   Years since quitting: 22.2  Smokeless Tobacco Never    Labs: Review Flowsheet       Latest Ref Rng & Units 11/19/2022 11/24/2022 01/21/2023  Labs for ITP Cardiac and Pulmonary Rehab  Cholestrol 0 - 200 mg/dL 269  166  108   LDL (calc) 0 - 99 mg/dL UNABLE TO CALCULATE IF TRIGLYCERIDE OVER 400 mg/dL  91  44   Direct LDL 0 - 99 mg/dL 154  - -  HDL-C >40 mg/dL 51  34  37   Trlycerides <150 mg/dL 477  205  133   Hemoglobin A1c 4.8 - 5.6 % 7.2  - -     Exercise Target Goals: Exercise Program Goal: Individual exercise prescription set using results from initial 6 min walk test and THRR while  considering  patient's activity barriers and safety.   Exercise Prescription Goal: Initial exercise prescription builds to 30-45 minutes a day of aerobic activity, 2-3 days per week.  Home exercise guidelines will be given to patient during program as part of exercise prescription that the participant will acknowledge.   Education: Aerobic Exercise: - Group verbal and visual presentation on the components of exercise prescription. Introduces F.I.T.T principle from ACSM for exercise prescriptions.  Reviews F.I.T.T. principles of aerobic exercise including progression. Written material given at graduation. Flowsheet Row Cardiac Rehab from 02/20/2023 in [facility] Cardiac and Pulmonary Rehab  Education need identified 12/12/22  Date 01/02/23  Educator [facility]  Instruction Review Code 1- Verbalizes Understanding       Education: Resistance  Exercise: - Group verbal and visual presentation on the components of exercise prescription. Introduces F.I.T.T principle from ACSM for exercise prescriptions  Reviews F.I.T.T. principles of resistance exercise including progression. Written material given at graduation.    Education: Exercise & Equipment Safety: - Individual verbal instruction and demonstration of equipment use and safety with use of the equipment. Flowsheet Row Cardiac Rehab from 02/20/2023 in [facility] Cardiac and Pulmonary Rehab  Date 12/12/22  Educator NT  Instruction Review Code 1- Verbalizes Understanding       Education: Exercise Physiology & General Exercise Guidelines: - Group verbal and written instruction with models to review the exercise physiology of the cardiovascular system and associated critical values. Provides general exercise guidelines with specific guidelines to those with heart or lung disease.  Flowsheet Row Cardiac Rehab from 02/20/2023 in [facility] Cardiac and Pulmonary Rehab  Date 12/26/22  Educator [facility]  Instruction Review Code 1- Verbalizes Understanding       Education: Flexibility, Balance, Mind/Body Relaxation: - Group verbal and visual presentation with interactive activity on the components of exercise prescription. Introduces F.I.T.T principle from ACSM for exercise prescriptions. Reviews F.I.T.T. principles of flexibility and balance exercise training including progression. Also discusses the mind body connection.  Reviews various relaxation techniques to help reduce and manage stress (i.e. Deep breathing, progressive muscle relaxation, and visualization). Balance handout provided to take home. Written material given at graduation. Flowsheet Row Cardiac Rehab from 02/20/2023 in [facility] Cardiac and Pulmonary Rehab  Date 01/16/23  Educator [facility]  Instruction Review Code 1- Verbalizes Understanding       Activity Barriers & Risk Stratification:  Activity Barriers & Cardiac Risk Stratification - 12/12/22  1507       Activity Barriers & Cardiac Risk Stratification   Activity Barriers Balance Concerns;Muscular Weakness    Cardiac Risk Stratification High             6 Minute Walk:  6 Minute Walk     Row Name 12/12/22 1506 02/18/23 1119       6 Minute Walk   Phase Initial Discharge    Distance 955 feet 1170 feet    Distance % Change -- 22.5 %    Distance Feet Change -- 215 ft    Walk Time 6 minutes 6 minutes    # of Rest Breaks 0 0    MPH 1.81 2.22    METS 1.39 1.62    RPE 12 12    Perceived Dyspnea  0 0    VO2 Peak 4.87 5.69    Symptoms Yes (comment) No    Comments leg fatigue --    Resting HR 55 bpm 63 bpm    Resting BP 124/66 112/60    Resting Oxygen Saturation  98 % 95 %  Exercise Oxygen Saturation  during 6 min walk 96 % 92 %    Max Ex. HR 55 bpm 94 bpm    Max Ex. BP 134/66 120/64    2 Minute Post BP 122/64 --             Oxygen Initial Assessment:   Oxygen Re-Evaluation:   Oxygen Discharge (Final Oxygen Re-Evaluation):   Initial Exercise Prescription:  Initial Exercise Prescription - 12/12/22 1500       Date of Initial Exercise RX and Referring Provider   Date 12/12/22    Referring Provider Kathlyn Sacramento MD      Oxygen   Maintain Oxygen Saturation 88% or higher      Treadmill   MPH 1.6    Grade 0    Minutes 15    METs 2.23      NuStep   Level 2    SPM 80    Minutes 1.39    METs 1.39      Biostep-RELP   Level 1    SPM 50    Minutes 15    METs 1.39      Track   Laps 18    Minutes 15    METs 1.98      Prescription Details   Frequency (times per week) 3    Duration Progress to 30 minutes of continuous aerobic without signs/symptoms of physical distress      Intensity   THRR 40-80% of Max Heartrate 88-121    Ratings of Perceived Exertion 11-13    Perceived Dyspnea 0-4      Progression   Progression Continue to progress workloads to maintain intensity without signs/symptoms of physical distress.      Resistance  Training   Training Prescription Yes    Weight 2 lb    Reps 10-15             Perform Capillary Blood Glucose checks as needed.  Exercise Prescription Changes:   Exercise Prescription Changes     Row Name 12/12/22 1500 12/24/22 1400 01/07/23 1000 01/21/23 1300 01/23/23 1100     Response to Exercise   Blood Pressure (Admit) 124/66 122/62 92/60 104/78 --   Blood Pressure (Exercise) 134/66 132/60 100/58 135/72 --   Blood Pressure (Exit) 122/64 124/70 102/60 122/60 --   Heart Rate (Admit) 55 bpm 65 bpm 75 bpm 70 bpm --   Heart Rate (Exercise) 93 bpm 99 bpm 101 bpm 97 bpm --   Heart Rate (Exit) 56 bpm 63 bpm 79 bpm 74 bpm --   Oxygen Saturation (Admit) 98 % -- -- -- --   Oxygen Saturation (Exercise) 96 % -- -- -- --   Rating of Perceived Exertion (Exercise) 12 13 13 14  --   Perceived Dyspnea (Exercise) 0 -- -- -- --   Symptoms leg fatigue none none none --   Comments 6MWT Results 1st Full day of rehab -- -- --   Duration -- Progress to 30 minutes of  aerobic without signs/symptoms of physical distress Continue with 30 min of aerobic exercise without signs/symptoms of physical distress. Continue with 30 min of aerobic exercise without signs/symptoms of physical distress. --   Intensity -- THRR unchanged THRR unchanged THRR unchanged --     Progression   Progression -- Continue to progress workloads to maintain intensity without signs/symptoms of physical distress. Continue to progress workloads to maintain intensity without signs/symptoms of physical distress. Continue to progress workloads to maintain intensity without signs/symptoms of physical  distress. --   Average METs -- 1.91 2.15 2.26 --     Resistance Training   Training Prescription -- Yes Yes Yes --   Weight -- 2 lb 2 lb 3 lb --   Reps -- 10-15 10-15 10-15 --     Interval Training   Interval Training -- No No No --     Treadmill   MPH -- -- 1.8 1.5 --   Grade -- -- 0 1 --   Minutes -- -- 15 15 --   METs -- --  2.38 2.35 --     NuStep   Level -- -- 2 4 --   Minutes -- -- 15 15 --   METs -- -- 2.1 2.5 --     Biostep-RELP   Level -- 1 1 2  --   Minutes -- 15 15 15  --   METs -- 2 2 2  --     Track   Laps -- 15 23 23  --   Minutes -- 15 15 15  --   METs -- 1.82 2.25 2.25 --     Home Exercise Plan   Plans to continue exercise at -- -- -- -- [employer] (comment)  Pt walks at home and uses her personal 2 lb handweights for resistance training. She is also looking into joining a fitness center and expressed interest in the [place].   Frequency -- -- -- -- Add 2 additional days to program exercise sessions.   Initial Home Exercises Provided -- -- -- -- 01/23/23     Oxygen   Maintain Oxygen Saturation -- 88% or higher 88% or higher 88% or higher 88% or higher    Row Name 02/04/23 0900 02/18/23 1300 03/04/23 1000         Response to Exercise   Blood Pressure (Admit) 102/60 126/64 120/64     Blood Pressure (Exit) 102/60 112/66 128/62     Heart Rate (Admit) 67 bpm 71 bpm 63 bpm     Heart Rate (Exercise) 93 bpm 102 bpm 102 bpm     Heart Rate (Exit) 82 bpm 86 bpm 67 bpm     Rating of Perceived Exertion (Exercise) 13 14 14      Symptoms none none none     Duration Continue with 30 min of aerobic exercise without signs/symptoms of physical distress. Continue with 30 min of aerobic exercise without signs/symptoms of physical distress. Continue with 30 min of aerobic exercise without signs/symptoms of physical distress.     Intensity THRR unchanged THRR unchanged THRR unchanged       Progression   Progression Continue to progress workloads to maintain intensity without signs/symptoms of physical distress. Continue to progress workloads to maintain intensity without signs/symptoms of physical distress. Continue to progress workloads to maintain intensity without signs/symptoms of physical distress.     Average METs 2.47 2.44 2.41       Resistance Training   Training Prescription Yes Yes Yes      Weight 3 lb 3 lb 3 lb     Reps 10-15 10-15 10-15       Interval Training   Interval Training No No No       Treadmill   MPH -- 1.2 --     Grade -- 0 --     Minutes -- 15 --     METs -- 1.92 --       Recumbant Bike   Level -- 3 2     Watts -- 23 25  Minutes -- 15 15     METs -- 3.17 2.03       NuStep   Level 3 3 --     Minutes 15 15 --     METs 2.8 3.1 --       Arm Ergometer   Level -- -- 1     Minutes -- -- 15     METs -- -- 1       T5 Nustep   Level 1 1 1      Minutes 15 15 15      METs 2.1 1.9 1.9       Biostep-RELP   Level 2 2 1      Minutes 15 15 15      METs 2 2 2        Track   Laps 30 25 30      Minutes 15 15 15      METs 2.63 2.36 2.63       Home Exercise Plan   Plans to continue exercise at [facility] (comment)  Pt walks at home and uses her personal 2 lb handweights for resistance training. She is also looking into joining a fitness center and expressed interest in the [place] (comment)  Pt walks at home and uses her personal 2 lb handweights for resistance training. She is also looking into joining a fitness center and expressed interest in the [place] (comment)  Pt walks at home and uses her personal 2 lb handweights for resistance training. She is also looking into joining a fitness center and expressed interest in the [place].     Frequency Add 2 additional days to program exercise sessions. Add 2 additional days to program exercise sessions. Add 2 additional days to program exercise sessions.     Initial Home Exercises Provided 01/23/23 01/23/23 01/23/23       Oxygen   Maintain Oxygen Saturation 88% or higher 88% or higher 88% or higher              Exercise Comments:   Exercise Comments     Row Name 12/21/22 1153           Exercise Comments First full day of exercise!  Patient was oriented to gym and equipment including functions, settings, policies, and procedures.  Patient's  individual exercise prescription and treatment plan were reviewed.  All starting workloads were established based on the results of the 6 minute walk test done at initial orientation visit.  The plan for exercise progression was also introduced and progression will be customized based on patient's performance and goals.                Exercise Goals and Review:   Exercise Goals     Row Name 12/12/22 1500             Exercise Goals   Increase Physical Activity Yes       Intervention Provide advice, education, support and counseling about physical activity/exercise needs.;Develop an individualized exercise prescription for aerobic and resistive training based on initial evaluation findings, risk stratification, comorbidities and participant's personal goals.       Expected Outcomes Short Term: Attend rehab on a regular basis to increase amount of physical activity.;Long Term: Add in home exercise to make exercise part of routine and to increase amount of physical activity.;Long Term: Exercising regularly at least 3-5 days a week.       Increase Strength and Stamina Yes  Intervention Provide advice, education, support and counseling about physical activity/exercise needs.;Develop an individualized exercise prescription for aerobic and resistive training based on initial evaluation findings, risk stratification, comorbidities and participant's personal goals.       Expected Outcomes Short Term: Increase workloads from initial exercise prescription for resistance, speed, and METs.;Short Term: Perform resistance training exercises routinely during rehab and add in resistance training at home;Long Term: Improve cardiorespiratory fitness, muscular endurance and strength as measured by increased METs and functional capacity (6MWT)       Able to understand and use rate of perceived exertion (RPE) scale Yes       Intervention Provide education and explanation on how to use RPE scale        Expected Outcomes Short Term: Able to use RPE daily in rehab to express subjective intensity level;Long Term:  Able to use RPE to guide intensity level when exercising independently       Able to understand and use Dyspnea scale Yes       Intervention Provide education and explanation on how to use Dyspnea scale       Expected Outcomes Short Term: Able to use Dyspnea scale daily in rehab to express subjective sense of shortness of breath during exertion;Long Term: Able to use Dyspnea scale to guide intensity level when exercising independently       Knowledge and understanding of Target Heart Rate Range (THRR) Yes       Intervention Provide education and explanation of THRR including how the numbers were predicted and where they are located for reference       Expected Outcomes Short Term: Able to state/look up THRR;Long Term: Able to use THRR to govern intensity when exercising independently;Short Term: Able to use daily as guideline for intensity in rehab       Able to check pulse independently Yes       Intervention Provide education and demonstration on how to check pulse in carotid and radial arteries.;Review the importance of being able to check your own pulse for safety during independent exercise       Expected Outcomes Short Term: Able to explain why pulse checking is important during independent exercise;Long Term: Able to check pulse independently and accurately       Understanding of Exercise Prescription Yes       Intervention Provide education, explanation, and written materials on patient's individual exercise prescription       Expected Outcomes Short Term: Able to explain program exercise prescription;Long Term: Able to explain home exercise prescription to exercise independently                Exercise Goals Re-Evaluation :  Exercise Goals Re-Evaluation     Row Name 12/21/22 1153 12/24/22 1415 12/28/22 1116 01/07/23 1040 01/21/23 1332     Exercise Goal Re-Evaluation    Exercise Goals Review Able to understand and use rate of perceived exertion (RPE) scale;Knowledge and understanding of Target Heart Rate Range (THRR);Understanding of Exercise Prescription Understanding of Exercise Prescription;Increase Strength and Stamina;Increase Physical Activity Understanding of Exercise Prescription;Increase Strength and Stamina;Increase Physical Activity Understanding of Exercise Prescription;Increase Strength and Stamina;Increase Physical Activity Understanding of Exercise Prescription;Increase Strength and Stamina;Increase Physical Activity   Comments Reviewed RPE scale, THR and program prescription with pt today.  Pt voiced understanding and was given a copy of goals to take home. Gladyce is off to a good start in rehab. She had an average MET level of 1.91 METs during her first session in rehab.  She also was able to walk 15 laps on the track. We will continue to monitor her progress in the program. Jannatul is doing well in rehab.  She is on her fourth visit and holding pretty good.  She is still getting a little winded when she is walking and talking.  She feels liks she is off to a good start and looks forward to coming each day.  She is also still doing some her PT exercises to help with strengthing as well. Zannah continues to do well in rehab. She did increase her treadmill to a speed of 1.8 mph and was able to hit 23 laps on the track which has been her highest yet! She would benefit from increasing to level 2 on the Biostep as she has been consistent at level 1 for some time now. She continues to reach her THR each session. Will continue to monitor. Javanna continues to do well in rehab. She increased her overall average MET level to 2.26 METs. She also improved to level 2 on the biostep and level 4 on the T4 Nustep. She has stayed consistent with her walking at 23 laps on the track. She did increase from 2 lb to 3 lb hand weights for resistance training. We will continue to monitor her progress.    Expected Outcomes Short: Use RPE daily to regulate intensity. Long: Follow program prescription in THR. Short: Continue to attend cardiac rehab. Long: Continue to follow exercise prescription. Short: continue to attend regularly Long: conitnue to follow exercise prescription,. Short: Increase to level 2 on the Biostep Long: Continue to increase overall stamina and MET level Short: Continue to push for more laps on the track. Long: Continue to improve strength and stamina.    [employer] Name 01/23/23 1149 02/04/23 0929 02/11/23 1114 02/18/23 1331 03/04/23 1007     Exercise Goal Re-Evaluation   Exercise Goals Review Understanding of Exercise Prescription;Able to understand and use rate of perceived exertion (RPE) scale;Knowledge and understanding of Target Heart Rate Range (THRR);Able to understand and use Dyspnea scale;Able to check pulse independently Understanding of Exercise Prescription;Increase Physical Activity;Increase Strength and Stamina Understanding of Exercise Prescription;Increase Physical Activity;Increase Strength and Stamina Understanding of Exercise Prescription;Increase Physical Activity;Increase Strength and Stamina Understanding of Exercise Prescription;Increase Physical Activity;Increase Strength and Stamina   Comments Reviewed home exercise with pt today.  Pt plans to use personal 2 lb hand weights for resistance training and walk for exercise.  She also is looking into the joining a fitness center and expressed interest in the [place]. Reviewed THR, pulse, RPE, sign and symptoms, pulse oximetery and when to call 911 or MD.  Also discussed weather considerations and indoor options.  Pt voiced understanding. Katriel is doing well in rehab. She was able to walk a full 30 laps on the track! She tried out the T5 Nustep for the first time at level 1, and would benefit from increasing to level 2. She does hit her THR most sessions in rehab. We will continue to monitor. Kylea has been doing some reistance  training and chair yoga at home for exercise. She has not been walking on her days away from rehab due to the cold weather, but plans to walk [facility] once it gets warmer outside. She reports that she feels she has been doing great with the exercise at rehab and always looks forward to it. She states that the biggest improvements she has seen since beginning aregular exercise routine is improved balance and less  SOB. We will continue to monitor her progress. Starlin is doing well in rehab and is close to graduating. She recently completed her post 6MWT and improved by 22.5%! She also has continued to do well on her seated machines, specifically she improved to level 3 on the recumbent bike. She also has continued to walk 25-30 laps on the track. We will continue to monitor her progress in the program until she graduates. Maritere is doing well in rehab. She will be graduating next class. She continues to work at appropriate RPEs with normal HR values. Her last session she walked another full 30 laps on the track. Will monitor until she graduates.   Expected Outcomes Short: Continue to walk on days away from rehab. Long: Conitnue to exercise independently. Short: Increase to level 2 on the T5 Nustep Long: Continue to increase overall MET level and stamina Short: Begin walking on days away from rehab. Long: Continue to exercise independently. Short: Graduate. Long: Continue to exercise independently. Short: Graduate. Long: Continue to exercise independently at appropriate prescription            Discharge Exercise Prescription (Final Exercise Prescription Changes):  Exercise Prescription Changes - 03/04/23 1000       Response to Exercise   Blood Pressure (Admit) 120/64    Blood Pressure (Exit) 128/62    Heart Rate (Admit) 63 bpm    Heart Rate (Exercise) 102 bpm    Heart Rate (Exit) 67 bpm    Rating of Perceived Exertion (Exercise) 14    Symptoms none    Duration Continue with 30 min of  aerobic exercise without signs/symptoms of physical distress.    Intensity THRR unchanged      Progression   Progression Continue to progress workloads to maintain intensity without signs/symptoms of physical distress.    Average METs 2.41      Resistance Training   Training Prescription Yes    Weight 3 lb    Reps 10-15      Interval Training   Interval Training No      Recumbant Bike   Level 2    Watts 25    Minutes 15    METs 2.03      Arm Ergometer   Level 1    Minutes 15    METs 1      T5 Nustep   Level 1    Minutes 15    METs 1.9      Biostep-RELP   Level 1    Minutes 15    METs 2      Track   Laps 30    Minutes 15    METs 2.63      Home Exercise Plan   Plans to continue exercise at [facility] (comment)   Pt walks at home and uses her personal 2 lb handweights for resistance training. She is also looking into joining a fitness center and expressed interest in the [place].   Frequency Add 2 additional days to program exercise sessions.    Initial Home Exercises Provided 01/23/23      Oxygen   Maintain Oxygen Saturation 88% or higher             Nutrition:  Target Goals: Understanding of nutrition guidelines, daily intake of sodium 1500mg , cholesterol 200mg , calories 30% from fat and 7% or less from saturated fats, daily to have 5 or more servings of fruits and vegetables.  Education: All About Nutrition: -Group instruction provided by verbal, written  material, interactive activities, discussions, models, and posters to present general guidelines for heart healthy nutrition including fat, fiber, MyPlate, the role of sodium in heart healthy nutrition, utilization of the nutrition label, and utilization of this knowledge for meal planning. Follow up email sent as well. Written material given at graduation.   Biometrics:  Pre Biometrics - 12/12/22 1500       Pre Biometrics   Height 4' 10.5" (1.486 m)    Weight 136 lb 12.8 oz (62.1 kg)     Waist Circumference 37.5 inches    Hip Circumference 41 inches    Waist to Hip Ratio 0.91 %    BMI (Calculated) 28.1    Single Leg Stand 1.9 seconds             Post Biometrics - 02/18/23 1126        Post  Biometrics   Height 4' 10.5" (1.486 m)    Weight 136 lb 11.2 oz (62 kg)    Waist Circumference 35.5 inches    Hip Circumference 39 inches    Waist to Hip Ratio 0.91 %    BMI (Calculated) 28.08    Single Leg Stand 2.9 seconds             Nutrition Therapy Plan and Nutrition Goals:  Nutrition Therapy & Goals - 01/29/23 0902       Nutrition Therapy   Diet Heart healthy, low Na, T2DM MNT    Drug/Food Interactions Statins/Certain Fruits    Protein (specify units) 75g    Fiber 25 grams    Whole Grain Foods 3 servings    Saturated Fats 16 max. grams    Fruits and Vegetables 8 servings/day    Sodium 2 grams      Personal Nutrition Goals   Nutrition Goal ST: review paperwork, choose a CHO source to add for lunch and dinner (beans/lentils, sweet potato, fruit), include one different vegetable per week to add to salad. LT: maintain A1C <7, have 1 good protein source at each meal, include at least 1 serving of CHO at mealtime, increse food variety.    Comments 82 y.o. F admitted to cardiac rehab s/p STEMI with stents. PMHx includes CAD, T2DM, HTN, HLD. Most recent labs reviewed 01/29/23; latest A1C 7.2 as of 11/19/22. Relevant medications reviewed 01/29/23; xanax, lexapro, magnesium oxide, metformin, protonix, crestor. Dorisann reports trying to include more health promoting foods. B: avocado toast (seeded rye bread) with a boiled egg and unsweetened applesauce with coffee (2 equal or splenda and cream). She loves low-salt tomato juice. L: salad (lettuce, cucumbers, tomatoes, celery, avocado with champagne dressing) with unsweet tea D: air fryer chicken and some vegetables with unsweet tea. Ginna reports using smart balance butter. S: no-sugar cookies, nuts (no salt or no salt peanuts)  Drinks: water. Discussed heart healthy eating and T2DM MNT.      Intervention Plan   Intervention Prescribe, educate and counsel regarding individualized specific dietary modifications aiming towards targeted core components such as weight, hypertension, lipid management, diabetes, heart failure and other comorbidities.    Expected Outcomes Short Term Goal: A plan has been developed with personal nutrition goals set during dietitian appointment.;Short Term Goal: Understand basic principles of dietary content, such as calories, fat, sodium, cholesterol and nutrients.;Long Term Goal: Adherence to prescribed nutrition plan.             Nutrition Assessments:  MEDIFICTS Score Key: ?70 Need to make dietary changes  40-70 Heart Healthy Diet ? 40 Therapeutic Level  Cholesterol Diet  Flowsheet Row Cardiac Rehab from [facility]/18/2023 in [facility] Cardiac and Pulmonary Rehab  Picture Your Plate Total Score on Discharge 73      Picture Your Plate Scores: [id number] Unhealthy dietary pattern with much room for improvement. 41-50 Dietary pattern unlikely to meet recommendations for good health and room for improvement. 51-60 More healthful dietary pattern, with some room for improvement.  >60 Healthy dietary pattern, although there may be some specific behaviors that could be improved.    Nutrition Goals Re-Evaluation:  Nutrition Goals Re-Evaluation     [place] Name 12/28/22 1128 01/23/23 1141 02/11/23 1120         Goals   Current Weight -- 136 lb 6.4 oz (61.9 kg) --     Nutrition Goal Heart Healthy diet Speak with RD about heart healthy diet. --     Comment Lelaina started while dietitian was out on leave.  She has already started to make some changes to her diet.  She has cut back on carbs and sugar.  She never adds salt and buys low sodium.  Her biggest weakness is her tomato juice daily.  She is working on cutting back. Haidi expressed interest in speaking with the RD. She will schedule an appointment to speak with  the RD about a heart healthy diet. She is still working on cutting back on carbs and sugar. She is still trying to cut back on her sodium content by not adding salt at the table and buying low sodium. Kathalyn recently spoke with the RD. She states that after speaking with the RD she has been more mindful about the foods she has been eating. She is reading food labels and trying to make sure she gets enough protein.     Expected Outcome Short; Continue to reduce sugar and salt Long: Set up appt with dietitian Short: Schedule an appointment with RD. Long: Continue to practice heart healthy eating patterns. Short: Continue to read food labels, and work on getting enough protein. Long: Continue heart-healthy eating patterns.              Nutrition Goals Discharge (Final Nutrition Goals Re-Evaluation):  Nutrition Goals Re-Evaluation - 02/11/23 1120       Goals   Comment Etna recently spoke with the RD. She states that after speaking with the RD she has been more mindful about the foods she has been eating. She is reading food labels and trying to make sure she gets enough protein.    Expected Outcome Short: Continue to read food labels, and work on getting enough protein. Long: Continue heart-healthy eating patterns.             Psychosocial: Target Goals: Acknowledge presence or absence of significant depression and/or stress, maximize coping skills, provide positive support system. Participant is able to verbalize types and ability to use techniques and skills needed for reducing stress and depression.   Education: Stress, Anxiety, and Depression - Group verbal and visual presentation to define topics covered.  Reviews how body is impacted by stress, anxiety, and depression.  Also discusses healthy ways to reduce stress and to treat/manage anxiety and depression.  Written material given at graduation.   Education: Sleep Hygiene -Provides group verbal and written instruction about how sleep can  affect your health.  Define sleep hygiene, discuss sleep cycles and impact of sleep habits. Review good sleep hygiene tips.    Initial Review & Psychosocial Screening:  Initial Psych Review & Screening - 11/28/22 1016  Initial Review   Current issues with Current Anxiety/Panic;Current Stress Concerns;History of Depression;Current Psychotropic Meds    Source of Stress Concerns Unable to participate in former interests or hobbies      [place]? Yes   daughter; church family     Barriers   Psychosocial barriers to participate in program There are no identifiable barriers or psychosocial needs.;The patient should benefit from training in stress management and relaxation.      Screening Interventions   Interventions Provide feedback about the scores to participant;Encouraged to exercise;To provide support and resources with identified psychosocial needs    Expected Outcomes Short Term goal: Utilizing psychosocial counselor, staff and physician to assist with identification of specific Stressors or current issues interfering with healing process. Setting desired goal for each stressor or current issue identified.;Long Term Goal: Stressors or current issues are controlled or eliminated.;Short Term goal: Identification and review with participant of any Quality of Life or Depression concerns found by scoring the questionnaire.;Long Term goal: The participant improves quality of Life and PHQ9 Scores as seen by post scores and/or verbalization of changes             Quality of Life Scores:   Quality of Life - 02/18/23 1144       Quality of Life Scores   Health/Function Pre 21.8 %    Health/Function Post 26.86 %    Health/Function % Change 23.21 %    Socioeconomic Pre 24.38 %    Socioeconomic Post 25.75 %    Socioeconomic % Change  5.62 %    Psych/Spiritual Pre 30 %    Psych/Spiritual Post 26.36 %    Psych/Spiritual % Change -12.13 %    Family Pre 27 %     Family Post 25.5 %    Family % Change -5.56 %    GLOBAL Pre 24.77 %    GLOBAL Post 26.35 %    GLOBAL % Change 6.38 %            Scores of 19 and below usually indicate a poorer quality of life in these areas.  A difference of  2-3 points is a clinically meaningful difference.  A difference of 2-3 points in the total score of the Quality of Life Index has been associated with significant improvement in overall quality of life, self-image, physical symptoms, and general health in studies assessing change in quality of life.  PHQ-9: Review Flowsheet       02/18/2023 01/11/2023 12/12/2022 05/30/2017  Depression screen PHQ 2/9  Decreased Interest 1 0 1 0  Down, Depressed, Hopeless 0 0 1 0  PHQ - 2 Score 1 0 2 0  Altered sleeping 0 0 0 -  Tired, decreased energy 1 1 3  -  Change in appetite 0 0 1 -  Feeling bad or failure about yourself  0 0 1 -  Trouble concentrating 0 0 1 -  Moving slowly or fidgety/restless 0 0 1 -  Suicidal thoughts 0 0 0 -  PHQ-9 Score 2 1 9  -  Difficult doing work/chores Not difficult at all Not difficult at all Not difficult at all -   Interpretation of Total Score  Total Score Depression Severity:  1-4 = Minimal depression, 5-9 = Mild depression, 10-14 = Moderate depression, 15-19 = Moderately severe depression, 20-27 = Severe depression   Psychosocial Evaluation and Intervention:  Psychosocial Evaluation - 11/28/22 1026       Psychosocial Evaluation & Interventions  Interventions Encouraged to exercise with the program and follow exercise prescription    Comments Ms. Selia is coming to cardiac rehab after a MI and stent. A few days after being discharged, she was readmitted with mini strokes. She states she has recovered fine and has no deficits. She has a great support system of her daughter and church family that help out whenever she needs it. She is sleeping well with her CPAP. She is on Lexapro after starting it for depression after her husbadn passed  years ago. She said she felt better all around on it and has chosen to stay on it. When asked about stress, she mentioned she tries not to stress about much. She is trying to make sure she gets her health under control and is ready to start the program.    Expected Outcomes Short: attend cardiac rehab for education and exercise. Long: develop and maintain positive self care habits    Continue Psychosocial Services  Follow up required by staff             Psychosocial Re-Evaluation:  Psychosocial Re-Evaluation     [place] Name 12/28/22 1117 01/11/23 1124 01/23/23 1133 02/11/23 1118       Psychosocial Re-Evaluation   Current issues with Current Stress Concerns Current Stress Concerns;Current Depression;History of Depression None Identified None Identified    Comments Cearia is doing well in rehab.  She helps serve with [employer] Whe[employer].  She is worried about a possible gout flare up in her big toe.  She continues to work on changing eating habits.  She usually sleeps well and it has gotten better since she has started to exercise more. Reviewed patient health questionnaire (PHQ-9) with patient for follow up. Previously, patients score indicated signs/symptoms of depression.  Reviewed to see if patient is improving symptom wise while in program.  Score improved and patient states that it is because they have been feeling better and getting out to exercise more.  She has come to terms with her meds not always making her feel her best. Pt denies any current stressors at this time. She states that she likes to read and socialize with others for stress relief. She states that exercise has been great for her mental state as well. She reports that she has a good support system around her made up by her family and church. She reports no concerns with her sleep at this time either. Pt denies any current stressors at this time. She states that she likes to read and socialize with others for stress relief. She states  that exercise has continued to be great for her mental state as well. She also states that she looks forward to coming to her rehab sessions each time. She reports that she has a good support system around her made up by her family and church. She reports no concerns with her sleep at this time either.    Expected Outcomes Short: Conitnue to exercise for mental boost and sleep Long: conitnue to stay positive Short: Continue to attend [facility] regularly for regular exercise and social engagement. Long: Continue to improve symptoms and manage a positive mental state. Short: Continue to attend [facility] regularly for regular exercise and social engagement. Long: Continue to improve symptoms and manage a positive mental state. Short: Continue to attend [facility] regularly for regular exercise and social engagement. Long: Continue to improve symptoms and manage a positive mental state.    Interventions Encouraged to attend Cardiac Rehabilitation for the exercise;Stress  management education Encouraged to attend Cardiac Rehabilitation for the exercise Encouraged to attend Cardiac Rehabilitation for the exercise Encouraged to attend Cardiac Rehabilitation for the exercise    Continue Psychosocial Services  Follow up required by staff Follow up required by staff Follow up required by staff --             Psychosocial Discharge (Final Psychosocial Re-Evaluation):  Psychosocial Re-Evaluation - 02/11/23 1118       Psychosocial Re-Evaluation   Current issues with None Identified    Comments Pt denies any current stressors at this time. She states that she likes to read and socialize with others for stress relief. She states that exercise has continued to be great for her mental state as well. She also states that she looks forward to coming to her rehab sessions each time. She reports that she has a good support system around her made up by her family and church. She reports no concerns with her  sleep at this time either.    Expected Outcomes Short: Continue to attend [facility] regularly for regular exercise and social engagement. Long: Continue to improve symptoms and manage a positive mental state.    Interventions Encouraged to attend Cardiac Rehabilitation for the exercise             Vocational Rehabilitation: Provide vocational rehab assistance to qualifying candidates.   Vocational Rehab Evaluation & Intervention:  Vocational Rehab - 11/28/22 1016       Initial Vocational Rehab Evaluation & Intervention   Assessment shows need for Vocational Rehabilitation No             Education: Education Goals: Education classes will be provided on a variety of topics geared toward better understanding of heart health and risk factor modification. Participant will state understanding/return demonstration of topics presented as noted by education test scores.  Learning Barriers/Preferences:  Learning Barriers/Preferences - 11/28/22 1016       Learning Barriers/Preferences   Learning Barriers None    Learning Preferences Individual Instruction             General Cardiac Education Topics:  AED/CPR: - Group verbal and written instruction with the use of models to demonstrate the basic use of the AED with the basic ABC's of resuscitation.   Anatomy and Cardiac Procedures: - Group verbal and visual presentation and models provide information about basic cardiac anatomy and function. Reviews the testing methods done to diagnose heart disease and the outcomes of the test results. Describes the treatment choices: Medical Management, Angioplasty, or Coronary Bypass Surgery for treating various heart conditions including Myocardial Infarction, Angina, Valve Disease, and Cardiac Arrhythmias.  Written material given at graduation. Flowsheet Row Cardiac Rehab from 02/20/2023 in [facility] Cardiac and Pulmonary Rehab  Education need identified 12/12/22       Medication  Safety: - Group verbal and visual instruction to review commonly prescribed medications for heart and lung disease. Reviews the medication, class of the drug, and side effects. Includes the steps to properly store meds and maintain the prescription regimen.  Written material given at graduation. Flowsheet Row Cardiac Rehab from 02/20/2023 in [facility] Cardiac and Pulmonary Rehab  Date 02/06/23  Educator MS  Instruction Review Code 1- Verbalizes Understanding       Intimacy: - Group verbal instruction through game format to discuss how heart and lung disease can affect sexual intimacy. Written material given at graduation.. Flowsheet Row Cardiac Rehab from 02/20/2023 in [facility] Cardiac and Pulmonary Rehab  Date 01/02/23  Educator [facility]  Instruction Review Code 1- Verbalizes Understanding       Know Your Numbers and Heart Failure: - Group verbal and visual instruction to discuss disease risk factors for cardiac and pulmonary disease and treatment options.  Reviews associated critical values for Overweight/Obesity, Hypertension, Cholesterol, and Diabetes.  Discusses basics of heart failure: signs/symptoms and treatments.  Introduces Heart Failure Zone chart for action plan for heart failure.  Written material given at graduation.   Infection Prevention: - Provides verbal and written material to individual with discussion of infection control including proper hand washing and proper equipment cleaning during exercise session. Flowsheet Row Cardiac Rehab from 02/20/2023 in [facility] Cardiac and Pulmonary Rehab  Date 12/12/22  Educator NT  Instruction Review Code 1- Verbalizes Understanding       Falls Prevention: - Provides verbal and written material to individual with discussion of falls prevention and safety. Flowsheet Row Cardiac Rehab from 02/20/2023 in [facility] Cardiac and Pulmonary Rehab  Date 12/12/22  Educator NT  Instruction Review Code 1- Verbalizes Understanding       Other: -Provides group  and verbal instruction on various topics (see comments) Flowsheet Row Cardiac Rehab from 02/20/2023 in [facility] Cardiac and Pulmonary Rehab  Date 01/30/23  Educator SB  Instruction Review Code 1- Verbalizes Understanding       Knowledge Questionnaire Score:  Knowledge Questionnaire Score - 02/18/23 1143       Knowledge Questionnaire Score   Pre Score 22/26    Post Score 24/26             Core Components/Risk Factors/Patient Goals at Admission:  Personal Goals and Risk Factors at Admission - 12/12/22 1459       Core Components/Risk Factors/Patient Goals on Admission   Diabetes Yes    Intervention Provide education about signs/symptoms and action to take for hypo/hyperglycemia.;Provide education about proper nutrition, including hydration, and aerobic/resistive exercise prescription along with prescribed medications to achieve blood glucose in normal ranges: Fasting glucose 65-99 mg/dL    Expected Outcomes Short Term: Participant verbalizes understanding of the signs/symptoms and immediate care of hyper/hypoglycemia, proper foot care and importance of medication, aerobic/resistive exercise and nutrition plan for blood glucose control.;Long Term: Attainment of HbA1C < 7%.    Hypertension Yes    Intervention Provide education on lifestyle modifcations including regular physical activity/exercise, weight management, moderate sodium restriction and increased consumption of fresh fruit, vegetables, and low fat dairy, alcohol moderation, and smoking cessation.;Monitor prescription use compliance.    Expected Outcomes Short Term: Continued assessment and intervention until BP is < 140/45mm HG in hypertensive participants. < 130/65mm HG in hypertensive participants with diabetes, heart failure or chronic kidney disease.;Long Term: Maintenance of blood pressure at goal levels.    Lipids Yes    Intervention Provide education and support for participant on nutrition & aerobic/resistive exercise along  with prescribed medications to achieve LDL 70mg , HDL >40mg .    Expected Outcomes Short Term: Participant states understanding of desired cholesterol values and is compliant with medications prescribed. Participant is following exercise prescription and nutrition guidelines.;Long Term: Cholesterol controlled with medications as prescribed, with individualized exercise RX and with personalized nutrition plan. Value goals: LDL < 70mg , HDL > 40 mg.             Education:Diabetes - Individual verbal and written instruction to review signs/symptoms of diabetes, desired ranges of glucose level fasting, after meals and with exercise. Acknowledge that pre and post exercise glucose checks will be done for 3 sessions at  entry of program. Flowsheet Row Cardiac Rehab from 02/20/2023 in [facility] Cardiac and Pulmonary Rehab  Date 11/28/22  Educator [facility]  Instruction Review Code 1- Verbalizes Understanding       Core Components/Risk Factors/Patient Goals Review:   Goals and Risk Factor Review     Row Name 12/28/22 1130 01/23/23 1135 02/11/23 1123         Core Components/Risk Factors/Patient Goals Review   Personal Goals Review Weight Management/Obesity;Diabetes;Hypertension Weight Management/Obesity;Diabetes;Hypertension Weight Management/Obesity;Diabetes;Hypertension     Review Maeleigh is off to a good start in rehab.  Her weight is already starting to come down some.  Her blood pressures are doing well and she checks them at home daily and keeps record for her doctor. She also checks her sugars twice a day at home. They have been doing okay but she knows they could be better. Jesyca is doing well managing her risk factors. Ady states that she still wants to lose a little weight. Her current weight is 136.4 lb with a goal weight of 125 lb. Azile has been checking her blood sugars at ome and reports that thye have been within normal ranges, but she is still working to improve them. Joyful states that she has been checking  her BP at home with her personal cuff every morning and night and reports that they are staying within normal ranges. Nicy states that she still would like to lose a little bit of weight. She weighed in today at 135.7 lbs and has a goal weight of 125 lbs. Kathi has been checking her blood sugars at home and reports that they are staying within normal ranges. She has also been checking her blood pressures at home. She states that she likes to check them in the morning before she takes her medications and reports that she usually has a systolic pressure of [id number]. However, her BP does come down after taking her medication and has stayed within normal ranges during her rehab sessions.     Expected Outcomes Short: Conitnue to work on managing her blood sugars Long: conitnue to montior risk factors. Short: Continue to work towards your weight goal with diet and exercise. Long: Continue to monitor risk factors. Short: Continue to work towards your weight goal with diet and exercise. Long: Continue to manage lifestyle risk factors.              Core Components/Risk Factors/Patient Goals at Discharge (Final Review):   Goals and Risk Factor Review - 02/11/23 1123       Core Components/Risk Factors/Patient Goals Review   Personal Goals Review Weight Management/Obesity;Diabetes;Hypertension    Review Melandie states that she still would like to lose a little bit of weight. She weighed in today at 135.7 lbs and has a goal weight of 125 lbs. Konnor has been checking her blood sugars at home and reports that they are staying within normal ranges. She has also been checking her blood pressures at home. She states that she likes to check them in the morning before she takes her medications and reports that she usually has a systolic pressure of [id number]. However, her BP does come down after taking her medication and has stayed within normal ranges during her rehab sessions.    Expected Outcomes Short: Continue to work towards  your weight goal with diet and exercise. Long: Continue to manage lifestyle risk factors.             ITP Comments:  ITP Comments  [place] Name 11/28/22 1024 12/12/22 1456 12/21/22 1153 01/09/23 0855 01/29/23 0902   ITP Comments Initial phone call completed. Diagnosis can be found in [facility] 12/4. EP Orientation scheduled for Wednesday 12/27 at 1:30. Completed 6MWT and gym orientation. Initial ITP created and sent for review to Dr. Emily Filbert, Medical Director. First full day of exercise!  Patient was oriented to gym and equipment including functions, settings, policies, and procedures.  Patient's individual exercise prescription and treatment plan were reviewed.  All starting workloads were established based on the results of the 6 minute walk test done at initial orientation visit.  The plan for exercise progression was also introduced and progression will be customized based on patient's performance and goals. 30 Day review completed. Medical Director ITP review done, changes made as directed, and signed approval by Medical Director.   new to program Completed initial RD consultation    [place] Name 02/06/23 0750 03/04/23 1143         ITP Comments 30 day review completed. ITP sent to Dr. Emily Filbert, Medical Director of Cardiac Rehab. Continue with ITP unless changes are made by physician. Aubrionna graduated today from  rehab with 36 sessions completed.  Details of the patient's exercise prescription and what She needs to do in order to continue the prescription and progress were discussed with patient.  Patient was given a copy of prescription and goals.  Patient verbalized understanding.  Hadlyn plans to continue to exercise by walking at home and using her personal 2 lb handweights for resistance training. She is also looking into joining a fitness center and expressed interest in the [place]. .               Comments: Discharge ITP

## 2023-03-08 ENCOUNTER — Other Ambulatory Visit
Admission: RE | Admit: 2023-03-08 | Discharge: 2023-03-08 | Disposition: A | Discharge status: Home / Self Care | Payer: Medicare PPO | Attending: Physician Assistant | Admitting: Physician Assistant

## 2023-03-08 DIAGNOSIS — Z8673 Personal history of transient ischemic attack (TIA), and cerebral infarction without residual deficits: Secondary | ICD-10-CM

## 2023-03-08 DIAGNOSIS — I2510 Atherosclerotic heart disease of native coronary artery without angina pectoris: Secondary | ICD-10-CM | POA: Insufficient documentation

## 2023-03-08 DIAGNOSIS — I2111 ST elevation (STEMI) myocardial infarction involving right coronary artery: Secondary | ICD-10-CM

## 2023-03-08 DIAGNOSIS — Z79899 Other long term (current) drug therapy: Secondary | ICD-10-CM | POA: Insufficient documentation

## 2023-03-08 DIAGNOSIS — E785 Hyperlipidemia, unspecified: Secondary | ICD-10-CM | POA: Diagnosis present

## 2023-03-08 LAB — BASIC METABOLIC PANEL
Anion gap: 11 (ref 5–15)
BUN: 23 mg/dL (ref 8–23)
CO2: 26 mmol/L (ref 22–32)
Calcium: 9.3 mg/dL (ref 8.9–10.3)
Chloride: 104 mmol/L (ref 98–111)
Creatinine, Ser: 1.01 mg/dL — ABNORMAL HIGH (ref 0.44–1.00)
GFR, Estimated: 55 mL/min — ABNORMAL LOW (ref >60)
Glucose, Bld: 221 mg/dL — ABNORMAL HIGH (ref 70–99)
Potassium: 4.4 mmol/L (ref 3.5–5.1)
Sodium: 141 mmol/L (ref 135–145)

## 2023-03-08 LAB — HEPATIC FUNCTION PANEL
ALT: 13 U/L (ref 0–44)
AST: 21 U/L (ref 15–41)
Albumin: 3.8 g/dL (ref 3.5–5.0)
Alkaline Phosphatase: 61 U/L (ref 38–126)
Bilirubin, Direct: <0.1 mg/dL (ref 0.0–0.2)
Total Bilirubin: 0.5 mg/dL (ref 0.3–1.2)
Total Protein: 7 g/dL (ref 6.5–8.1)

## 2023-03-08 LAB — LIPID PANEL
Cholesterol: 109 mg/dL (ref 0–200)
HDL: 40 mg/dL — ABNORMAL LOW (ref >40)
LDL Cholesterol: 46 mg/dL (ref 0–99)
Total CHOL/HDL Ratio: 2.7 RATIO
Triglycerides: 115 mg/dL (ref <150)
VLDL: 23 mg/dL (ref 0–40)

## 2023-03-25 ENCOUNTER — Other Ambulatory Visit: Payer: Self-pay | Admitting: Unknown Physician Specialty

## 2023-03-25 DIAGNOSIS — Z780 Asymptomatic menopausal state: Secondary | ICD-10-CM

## 2023-03-26 ENCOUNTER — Ambulatory Visit: Payer: Medicare PPO | Admitting: Physician Assistant

## 2023-04-02 ENCOUNTER — Encounter: Payer: Self-pay | Admitting: Emergency Medicine

## 2023-04-02 ENCOUNTER — Emergency Department: Payer: Medicare PPO

## 2023-04-02 ENCOUNTER — Observation Stay
Admission: EM | Admit: 2023-04-02 | Discharge: 2023-04-03 | Disposition: A | Discharge status: Home / Self Care | Payer: Medicare PPO | Attending: Internal Medicine | Admitting: Internal Medicine

## 2023-04-02 ENCOUNTER — Other Ambulatory Visit: Payer: Self-pay

## 2023-04-02 ENCOUNTER — Observation Stay: Payer: Medicare PPO

## 2023-04-02 DIAGNOSIS — E1142 Type 2 diabetes mellitus with diabetic polyneuropathy: Secondary | ICD-10-CM | POA: Diagnosis present

## 2023-04-02 DIAGNOSIS — N179 Acute kidney failure, unspecified: Secondary | ICD-10-CM | POA: Diagnosis not present

## 2023-04-02 DIAGNOSIS — G4733 Obstructive sleep apnea (adult) (pediatric): Secondary | ICD-10-CM

## 2023-04-02 DIAGNOSIS — Z7901 Long term (current) use of anticoagulants: Secondary | ICD-10-CM | POA: Diagnosis not present

## 2023-04-02 DIAGNOSIS — F411 Generalized anxiety disorder: Secondary | ICD-10-CM | POA: Diagnosis present

## 2023-04-02 DIAGNOSIS — Z79899 Other long term (current) drug therapy: Secondary | ICD-10-CM | POA: Diagnosis not present

## 2023-04-02 DIAGNOSIS — R4701 Aphasia: Principal | ICD-10-CM | POA: Insufficient documentation

## 2023-04-02 DIAGNOSIS — E119 Type 2 diabetes mellitus without complications: Secondary | ICD-10-CM | POA: Insufficient documentation

## 2023-04-02 DIAGNOSIS — K219 Gastro-esophageal reflux disease without esophagitis: Secondary | ICD-10-CM | POA: Diagnosis present

## 2023-04-02 DIAGNOSIS — G459 Transient cerebral ischemic attack, unspecified: Secondary | ICD-10-CM

## 2023-04-02 DIAGNOSIS — I2510 Atherosclerotic heart disease of native coronary artery without angina pectoris: Secondary | ICD-10-CM | POA: Diagnosis present

## 2023-04-02 DIAGNOSIS — Z7984 Long term (current) use of oral hypoglycemic drugs: Secondary | ICD-10-CM | POA: Insufficient documentation

## 2023-04-02 DIAGNOSIS — Z8673 Personal history of transient ischemic attack (TIA), and cerebral infarction without residual deficits: Secondary | ICD-10-CM

## 2023-04-02 DIAGNOSIS — I10 Essential (primary) hypertension: Secondary | ICD-10-CM | POA: Insufficient documentation

## 2023-04-02 DIAGNOSIS — I480 Paroxysmal atrial fibrillation: Secondary | ICD-10-CM | POA: Insufficient documentation

## 2023-04-02 LAB — CBC
HCT: 39.2 % (ref 36.0–46.0)
Hemoglobin: 12.3 g/dL (ref 12.0–15.0)
MCH: 27 pg (ref 26.0–34.0)
MCHC: 31.4 g/dL (ref 30.0–36.0)
MCV: 86 fL (ref 80.0–100.0)
Platelets: 225 10*3/uL (ref 150–400)
RBC: 4.56 MIL/uL (ref 3.87–5.11)
RDW: 13.2 % (ref 11.5–15.5)
WBC: 7.6 10*3/uL (ref 4.0–10.5)
nRBC: 0 % (ref 0.0–0.2)

## 2023-04-02 LAB — DIFFERENTIAL
Abs Immature Granulocytes: 0.01 10*3/uL (ref 0.00–0.07)
Basophils Absolute: 0.1 10*3/uL (ref 0.0–0.1)
Basophils Relative: 1 %
Eosinophils Absolute: 0.2 10*3/uL (ref 0.0–0.5)
Eosinophils Relative: 3 %
Immature Granulocytes: 0 %
Lymphocytes Relative: 12 %
Lymphs Abs: 0.9 10*3/uL (ref 0.7–4.0)
Monocytes Absolute: 0.6 10*3/uL (ref 0.1–1.0)
Monocytes Relative: 9 %
Neutro Abs: 5.8 10*3/uL (ref 1.7–7.7)
Neutrophils Relative %: 75 %

## 2023-04-02 LAB — COMPREHENSIVE METABOLIC PANEL
ALT: 11 U/L (ref 0–44)
AST: 21 U/L (ref 15–41)
Albumin: 3.8 g/dL (ref 3.5–5.0)
Alkaline Phosphatase: 63 U/L (ref 38–126)
Anion gap: 9 (ref 5–15)
BUN: 26 mg/dL — ABNORMAL HIGH (ref 8–23)
CO2: 26 mmol/L (ref 22–32)
Calcium: 8.9 mg/dL (ref 8.9–10.3)
Chloride: 104 mmol/L (ref 98–111)
Creatinine, Ser: 1.02 mg/dL — ABNORMAL HIGH (ref 0.44–1.00)
GFR, Estimated: 55 mL/min — ABNORMAL LOW (ref >60)
Glucose, Bld: 151 mg/dL — ABNORMAL HIGH (ref 70–99)
Potassium: 3.5 mmol/L (ref 3.5–5.1)
Sodium: 139 mmol/L (ref 135–145)
Total Bilirubin: 0.6 mg/dL (ref 0.3–1.2)
Total Protein: 6.9 g/dL (ref 6.5–8.1)

## 2023-04-02 LAB — ETHANOL: Alcohol, Ethyl (B): <10 mg/dL (ref <10)

## 2023-04-02 LAB — APTT: aPTT: 33 seconds (ref 24–36)

## 2023-04-02 LAB — CBG MONITORING, ED: Glucose-Capillary: 142 mg/dL — ABNORMAL HIGH (ref 70–99)

## 2023-04-02 LAB — PROTIME-INR
INR: 1.2 (ref 0.8–1.2)
Prothrombin Time: 15.1 seconds (ref 11.4–15.2)

## 2023-04-02 MED ORDER — HEPARIN SODIUM (PORCINE) 5000 UNIT/ML IJ SOLN
5000.0000 [IU] | Freq: Two times a day (BID) | INTRAMUSCULAR | Status: DC
  Administered 2023-04-02: 5000 [IU] via SUBCUTANEOUS
  Filled 2023-04-02: qty 1

## 2023-04-02 MED ORDER — HYDRALAZINE HCL 20 MG/ML IJ SOLN: 5.0000 mg | Freq: Four times a day (QID) | INTRAMUSCULAR | Status: DC | PRN

## 2023-04-02 MED ORDER — ACETAMINOPHEN 160 MG/5ML PO SOLN: 650.0000 mg | ORAL | Status: DC | PRN

## 2023-04-02 MED ORDER — ACETAMINOPHEN 500 MG PO TABS
1000.0000 mg | ORAL_TABLET | Freq: Once | ORAL | Status: AC
  Administered 2023-04-02: 1000 mg via ORAL
  Filled 2023-04-02: qty 2

## 2023-04-02 MED ORDER — STROKE: EARLY STAGES OF RECOVERY BOOK: Freq: Once | Status: DC

## 2023-04-02 MED ORDER — ACETAMINOPHEN 325 MG PO TABS: 650.0000 mg | ORAL_TABLET | ORAL | Status: DC | PRN

## 2023-04-02 MED ORDER — HYDRALAZINE HCL 20 MG/ML IJ SOLN: 10.0000 mg | Freq: Four times a day (QID) | INTRAMUSCULAR | Status: DC | PRN

## 2023-04-02 MED ORDER — HEPARIN SODIUM (PORCINE) 5000 UNIT/ML IJ SOLN: 5000.0000 [IU] | Freq: Two times a day (BID) | INTRAMUSCULAR | Status: DC

## 2023-04-02 MED ORDER — ACETAMINOPHEN 650 MG RE SUPP: 650.0000 mg | RECTAL | Status: DC | PRN

## 2023-04-02 MED ORDER — CLOPIDOGREL BISULFATE 75 MG PO TABS: 75.0000 mg | ORAL_TABLET | Freq: Every day | ORAL | Status: DC

## 2023-04-02 MED ORDER — GADOBUTROL 1 MMOL/ML IV SOLN
6.0000 mL | Freq: Once | INTRAVENOUS | Status: AC | PRN
  Administered 2023-04-02: 6 mL via INTRAVENOUS

## 2023-04-02 MED ORDER — ASPIRIN 81 MG PO CHEW: 81.0000 mg | CHEWABLE_TABLET | Freq: Every day | ORAL | Status: DC

## 2023-04-02 MED ORDER — SODIUM CHLORIDE 0.9 % IV SOLN: INTRAVENOUS | Status: DC

## 2023-04-02 NOTE — Assessment & Plan Note (Signed)
Lab Results  Component Value Date   CREATININE 1.02 (H) 04/02/2023   CREATININE 1.01 (H) 03/08/2023   CREATININE 0.95 01/21/2023  Avoid contrast studies. Renally dose meds . Hodl ace/arb.

## 2023-04-02 NOTE — ED Provider Triage Note (Signed)
Emergency Medicine Provider Triage Evaluation Note  Vanessa Young , a 82 y.o. female  was evaluated in triage.  Pt complains of change in speech and couldn't communicate. Symptoms started about 4pm then got better and returned again around 7pm. VAN is 0 now. Back to baseline. Similar symptoms in December and at that time she was having a stroke.   Physical Exam  SpO2 99%  Gen:   Awake, no distress   Resp:  Normal effort  MSK:   Moves extremities without difficulty  Other:    Medical Decision Making  Medically screening exam initiated at 8:24 PM.  Appropriate orders placed.  Vanessa Young was informed that the remainder of the evaluation will be completed by another provider, this initial triage assessment does not replace that evaluation, and the importance of remaining in the ED until their evaluation is complete.  Code stroke initiated.  No acute findings on CT.  ASPECTS 10 per radiology.   Chinita Pester, FNP 04/02/23 2046

## 2023-04-02 NOTE — Assessment & Plan Note (Signed)
Lexapro and xanax to be continued once pt passes swallow eval.

## 2023-04-02 NOTE — Assessment & Plan Note (Signed)
Npo. speech and swallow eval. Glycemic protocol with Accu-Cheks every 4 hours and as needed.

## 2023-04-02 NOTE — Assessment & Plan Note (Signed)
Coreg, chlorthalidone, losartan held for permissive hypertension.

## 2023-04-02 NOTE — Consult Note (Signed)
[facility] Tele[facility]urology Consult Services   Patient Name:   Vanessa Young, Vanessa Young Date of Birth:   10-30-1941 Identification Number:   MRN - [id number] Date of Service:   04/02/2023 20:33:18  Diagnosis:       G45.9 - Transient cerebral ischemic attack, unspecified  Impression:      82 year old female who presents to the hospital because of word finding difficulty. Presentation concerning for possible TIA.  Our recommendations are outlined below.  Recommendations:        Stroke/Telemetry Floor       Neuro Checks       Bedside Swallow Eval       DVT Prophylaxis       IV Fluids, Normal Saline       Head of Bed 30 Degrees       Euglycemia and Avoid Hyperthermia (PRN Acetaminophen)       Hold Anticoagulation for Now       Initiate dual antiplatelet therapy with Aspirin 81 mg daily and Clopidogrel 75 mg daily.       Antihypertensives PRN if Blood pressure is greater than 220/120 or there is a concern for End organ damage/contraindications for permissive HTN. If blood pressure is greater than 220/120 give labetalol PO or IV or Vasotec IV with a goal of 15% reduction in BP during the first 24 hours.  Sign Out:       Discussed with Emergency Department Provider    ------------------------------------------------------------------------------  Advanced Imaging: Advanced Imaging Deferred because:  Non-disabling symptoms as verified by the patient; no cortical signs so not consistent with LVO   Metrics: Last Known Well: 04/02/2023 14:00:00 [facility] Notification Time: 04/02/2023 20:33:18 Arrival Time: 04/02/2023 20:04:00 Stamp Time: 04/02/2023 20:33:18 Initial Response Time: 04/02/2023 20:41:46 Symptoms: word finding difficulty. Initial patient interaction: 04/02/2023 20:44:35 NIHSS Assessment Completed: 04/02/2023 20:43:54 Patient is not a candidate for Thrombolytic. Thrombolytic Medical Decision: 04/02/2023 20:43:55 Patient was not deemed candidate for  Thrombolytic because of following reasons: Use of NOAs within 48 hours.  CT head showed no acute hemorrhage or acute core infarct.  Primary Provider Notified of Diagnostic Impression and Management Plan on: 04/02/2023 20:58:14    ------------------------------------------------------------------------------  History of Present Illness: Patient is a 82 year old Female.  Patient was brought by private transportation with symptoms of word finding difficulty. 82 year old female with a history of prior stroke and CAD who presents to the hospital because of transient word finding difficulty. Patient reported feeling normal around 14:00 while shopping at [facility] then at 16:00 she called her daughter about something but had trouble getting the words out. Around 19:00 she called a family member to come over to help her and she had trouble speaking at that time as well. Patient was hypertensive at this time and by the time she arrived to the hospital her symptoms had resolved but she was still hypertensive.   Past Medical History:      Hypertension      Hyperlipidemia      Atrial Fibrillation      Coronary Artery Disease      Stroke  Medications:  Anticoagulant use:  Yes Eliquis Antiplatelet use: Yes Plavix Reviewed EMR for current medications  Allergies:  Reviewed  Social History: Drug Use: No  Family History:  There is no family history of premature cerebrovascular disease pertinent to this consultation  ROS : 14 Points Review of Systems was performed and was negative except mentioned in HPI.  Past Surgical History: There Is No  Surgical History Contributory To Today's Visit    Examination: BP(197/98), Pulse(69), Blood Glucose(142) 1A: Level of Consciousness - Alert; keenly responsive + 0 1B: Ask Month and Age - Both Questions Right + 0 1C: Blink Eyes & Squeeze Hands - Performs Both Tasks + 0 2: Test Horizontal Extraocular Movements - Normal + 0 3: Test Visual Fields -  No Visual Loss + 0 4: Test Facial Palsy (Use Grimace if Obtunded) - Normal symmetry + 0 5A: Test Left Arm Motor Drift - No Drift for 10 Seconds + 0 5B: Test Right Arm Motor Drift - No Drift for 10 Seconds + 0 6A: Test Left Leg Motor Drift - No Drift for 5 Seconds + 0 6B: Test Right Leg Motor Drift - No Drift for 5 Seconds + 0 7: Test Limb Ataxia (FNF/Heel-Shin) - No Ataxia + 0 8: Test Sensation - Normal; No sensory loss + 0 9: Test Language/Aphasia - Normal; No aphasia + 0 10: Test Dysarthria - Normal + 0 11: Test Extinction/Inattention - No abnormality + 0  NIHSS Score: 0   Pre-Morbid Modified Rankin Scale: 2 Points = Slight disability; unable to carry out all previous activities, but able to look after own affairs without assistance  Spoke with : Dr. Scotty Court  Patient/Family was informed the Neurology Consult would occur via TeleHealth consult by way of interactive audio and video telecommunications and consented to receiving care in this manner.   Patient is being evaluated for possible acute neurologic impairment and high probability of imminent or life-threatening deterioration. I spent total of 30 minutes providing care to this patient, including time for face to face visit via telemedicine, review of medical records, imaging studies and discussion of findings with providers, the patient and/or family.   Dr Joice Lofts   [facility] For Inpatient follow-up with [facility] physician please call [facility] [phone]. This is not an outpatient service. Post hospital discharge, please contact hospital directly.  Please do not communicate with [facility] physicians via secure chat. If you have any questions, Please contact [facility]. Please call or reconsult our service if there are any clinical or diagnostic changes.

## 2023-04-02 NOTE — Assessment & Plan Note (Signed)
CPAP per home settings.  

## 2023-04-02 NOTE — Assessment & Plan Note (Signed)
Eliquis currently held.  Cont asa daily.  EKG shws SR at 69.

## 2023-04-02 NOTE — Consult Note (Signed)
Telestroke cart was activated at 2026. Per treatment team, pt's LKW was at 1400 with c/o expressive aphagia. Triplett, EDP assessed patient in triage at 2024. Pt transported to CT prior to cart activation and returned to room at 2034. [facility] was paged for code stroke at 2033. Dr. Buren Kos, [facility]D appeared on telestroke cart at 2041 to assess the patient. Based on [facility]SMD's assessment, pt does not meet criteria for emergent interventions at this time 2/2 NOAs within 48 hours. [facility]D to f/u with EDP regarding recommendations. No further needs from Telestroke RN at this time. Telestroke cart disconnected at 2054.

## 2023-04-02 NOTE — Assessment & Plan Note (Signed)
Patient is at high risk for repeat CVA as she has had a CVA in December.  Her exam today is nonfocal and her deficits have resolved.  Patient has a history of A-fib which also puts her at increased risk for CVA.  Patient is compliant with her anticoagulation with Eliquis.  Per neurology recommendation we will admit patient to telemetry floor for inpatient stroke evaluation with an MRI.        Stroke/Telemetry Floor       Neuro Checks       Bedside Swallow Eval       DVT Prophylaxis       IV Fluids, Normal Saline       Head of Bed 30 Degrees       Euglycemia and Avoid Hyperthermia (PRN Acetaminophen)       Hold Anticoagulation with eliquis for Now       Initiate dual antiplatelet therapy with Aspirin 81 mg daily and Clopidogrel 75 mg daily.       Antihypertensives PRN if Blood pressure is greater than 220/120 or there is a concern for End organ damage/contraindications for permissive HTN. If blood pressure is greater than 220/120 give labetalol PO or IV or Vasotec IV with a goal of 15% reduction in BP during the first 24 hours.

## 2023-04-02 NOTE — ED Notes (Signed)
Code Stroke called to [facility] spoke with Selena Batten

## 2023-04-02 NOTE — Progress Notes (Signed)
   04/02/23 2100  Spiritual Encounters  Type of Visit Initial  Care provided to: Pt and family  Referral source Code page  Reason for visit Code  OnCall Visit Yes  Spiritual Framework  Presenting Themes Courage hope and growth  Interventions  Spiritual Care Interventions Made Prayer  Intervention Outcomes  Outcomes Connection to spiritual care  Spiritual Care Plan  Spiritual Care Issues Still Outstanding No further spiritual care needs at this time (see row info)   Chaplain responded to code stroke.  Family at bedside.  Chaplain provided compassionate presence, reflective listening and prayed with family as requested. Chaplain available for follow up as needed.

## 2023-04-02 NOTE — Assessment & Plan Note (Signed)
Stable chest pain free. EKG nonischemic.  Cont DAPT with asa/ plavix.

## 2023-04-02 NOTE — H&P (Signed)
History and Physical     Patient: Vanessa Young:[id number] DOB: 29-Nov-1941 DOA: 04/02/2023 DOS: the patient was seen and examined on 04/02/2023 PCP: Filbert Berthold, MD   Patient coming from: Home  Chief Complaint: Aphasia.   HISTORY OF PRESENT ILLNESS: Vanessa Young is an 82 y.o. female seen in ed for aphasia and code stroke activated.  Per nurses note pt reports two episode of aphasia and inability to read or identify objects on phone with family member at 1600 and 1900. Similar symptoms in December when pt had a CVA, patient was discharged on Eliquis and Plavix.  Patient also has a history of paroxysmal atrial fibrillation for which patient was continued on Eliquis and Coreg.. Pt noted to be on plavix and eliquis.  Telestroke cart was started at 2026 last known normal at 1400 with her expressive aphasia and apraxia.  Patient criteria for emergent intervention with TPA or tnkase.  Patient seen by teleneurology.  Past Medical History:  Diagnosis Date   A-fib    Depression    Diabetes mellitus without complication    GERD (gastroesophageal reflux disease)    Hypercholesteremia    Hypertension    Review of Systems  Neurological:  Positive for speech difficulty and headaches.  All other systems reviewed and are negative.  Allergies  Allergen Reactions   Keflex [Cephalexin] Hives   Hydrochlorothiazide W-Triamterene Nausea Only   Lisinopril Other (See Comments)    Unknown     Penicillins Hives   Statins Other (See Comments)    Unknown    Past Surgical History:  Procedure Laterality Date   CORONARY/GRAFT ACUTE MI REVASCULARIZATION N/A 11/19/2022   Procedure: Coronary/Graft Acute MI Revascularization;  Surgeon: Iran Ouch, MD;  Location: [facility] INVASIVE CV LAB;  Service: Cardiovascular;  Laterality: N/A;   CYSTOSCOPY     LEFT HEART CATH AND CORONARY ANGIOGRAPHY N/A 11/19/2022   Procedure: LEFT HEART CATH AND CORONARY ANGIOGRAPHY;  Surgeon: Iran Ouch, MD;  Location: [facility]  INVASIVE CV LAB;  Service: Cardiovascular;  Laterality: N/A;   MEDICATIONS: Prior to Admission medications   Medication Sig Start Date End Date Taking? Authorizing Provider  ACCU-CHEK GUIDE test strip  01/03/22   [provider]  ALPRAZolam Prudy Feeler) 0.25 MG tablet Take 0.25 mg by mouth at bedtime as needed for sleep. 02/28/22   [provider]  apixaban (ELIQUIS) 5 MG TABS tablet Take 1 tablet (5 mg total) by mouth 2 (two) times daily. 12/18/22 03/18/23  Sondra Barges, PA-C  carvedilol (COREG) 12.5 MG tablet Take 1 tablet (12.5 mg total) by mouth 2 (two) times daily. 01/22/23 04/22/23  Sondra Barges, PA-C  chlorthalidone (HYGROTON) 25 MG tablet Take 0.5 tablets (12.5 mg total) by mouth daily. 03/04/23 06/02/23  Sondra Barges, PA-C  escitalopram (LEXAPRO) 10 MG tablet Take 10 mg by mouth daily. 08/29/22   [provider]  guanFACINE (TENEX) 1 MG tablet Take 1 mg by mouth 2 (two) times daily.  Patient not taking: Reported on 01/22/2023    [provider]  losartan (COZAAR) 100 MG tablet Take 1 tablet (100 mg total) by mouth daily. 12/18/22   Sondra Barges, PA-C  magnesium oxide (MAG-OX) 400 (240 Mg) MG tablet Take 1 tablet by mouth daily. 05/16/21   [provider]  metFORMIN (GLUMETZA) 500 MG (MOD) 24 hr tablet Take 500 mg by mouth 2 (two) times daily with a meal.    [provider]  pantoprazole (PROTONIX) 40 MG tablet Take  1 tablet (40 mg total) by mouth daily. 01/31/23 01/31/24  Sondra Barges, PA-C  rosuvastatin (CRESTOR) 20 MG tablet Take 1 tablet (20 mg total) by mouth daily. 02/07/23 05/08/23  Sondra Barges, PA-C   ED Course: Pt in Ed alert awake oriented O2 sats of 95% on room air. Vitals:   04/02/23 2013 04/02/23 2030 04/02/23 2036 04/02/23 2045  BP:  (!) 197/98  (!) 201/97  Pulse:  69  66  Resp:  16  15  Temp:  98.2 F (36.8 C)    TempSrc:  Oral    SpO2: 99% 96%  95%  Weight:   62 kg   Height:    (1.448 m)   EKG is SR at 69 with normal intervals.      No intake/output data recorded. SpO2: 95 % Blood work in ed shows: CMP shows glucose of 151 creatinine of 1.02 EGFR 55. Last lipid panel in March shows normal total cholesterol triglycerides and VLDL and LDL and HDL 40. CBC is within normal limits. PT/INR is within normal limits. Alcohol level is less than 10. CT head code stroke is negative for acute intracranial abnormality there is chronic ischemic microangiopathy and volume loss present. Most recent echo done in December 2023 by Dr.ARIDA when patient had her last stroke, showing  2D echocardiogram which showed an LVEF of 60 to 65% and evidence of grade 1 diastolic dysfunction.  Mild hypokinesis of left ventricle,, basal mid inferior wall and inferolateral wall. MRI last time showed MRI of the brain without contrast showed 3 small foci of acute infarcts in the posterior right frontal lobe, right parietal lobe and left frontal lobe. Given distribution, these are favored to be secondary to a central embolic source.  Results for orders placed or performed during the hospital encounter of 04/02/23 (from the past 72 hour(s))  Protime-INR     Status: None   Collection Time: 04/02/23  8:21 PM  Result Value Ref Range   Prothrombin Time 15.1 11.4 - 15.2 seconds   INR 1.2 0.8 - 1.2    Comment: (NOTE) INR goal varies based on device and disease states. Performed at [facility], [address] [place], [place] [address]   APTT     Status: None   Collection Time: 04/02/23  8:21 PM  Result Value Ref Range   aPTT 33 24 - 36 seconds    Comment: Performed at [facility], [address] [place], [place] [address]  CBC     Status: None   Collection Time: 04/02/23  8:21 PM  Result Value Ref Range   WBC 7.6 4.0 - 10.5 K/uL   RBC 4.56 3.87 - 5.11 MIL/uL   Hemoglobin 12.3 12.0 - 15.0 g/dL   HCT 01.6 01.0 - 93.2 %   MCV 86.0 80.0 - 100.0 fL   MCH 27.0 26.0 - 34.0 pg   MCHC 31.4 30.0 - 36.0 g/dL   RDW 35.5 73.2 - 20.2  %   Platelets 225 150 - 400 K/uL   nRBC 0.0 0.0 - 0.2 %    Comment: Performed at [facility], [address], [place], [place] [address]  Differential     Status: None   Collection Time: 04/02/23  8:21 PM  Result Value Ref Range   Neutrophils Relative % 75 %   Neutro Abs 5.8 1.7 - 7.7 K/uL   Lymphocytes Relative 12 %   Lymphs Abs 0.9 0.7 - 4.0 K/uL   Monocytes  Relative 9 %   Monocytes Absolute 0.6 0.1 - 1.0 K/uL   Eosinophils Relative 3 %   Eosinophils Absolute 0.2 0.0 - 0.5 K/uL   Basophils Relative 1 %   Basophils Absolute 0.1 0.0 - 0.1 K/uL   Immature Granulocytes 0 %   Abs Immature Granulocytes 0.01 0.00 - 0.07 K/uL    Comment: Performed at [facility], [address], [place], [place] [address]  Comprehensive metabolic panel     Status: Abnormal   Collection Time: 04/02/23  8:21 PM  Result Value Ref Range   Sodium 139 135 - 145 mmol/L   Potassium 3.5 3.5 - 5.1 mmol/L   Chloride 104 98 - 111 mmol/L   CO2 26 22 - 32 mmol/L   Glucose, Bld 151 (H) 70 - 99 mg/dL    Comment: Glucose reference range applies only to samples taken after fasting for at least 8 hours.   BUN 26 (H) 8 - 23 mg/dL   Creatinine, Ser 6.04 (H) 0.44 - 1.00 mg/dL   Calcium 8.9 8.9 - 54.0 mg/dL   Total Protein 6.9 6.5 - 8.1 g/dL   Albumin 3.8 3.5 - 5.0 g/dL   AST 21 15 - 41 U/L   ALT 11 0 - 44 U/L   Alkaline Phosphatase 63 38 - 126 U/L   Total Bilirubin 0.6 0.3 - 1.2 mg/dL   GFR, Estimated 55 (L) >60 mL/min    Comment: (NOTE) Calculated using the CKD-EPI Creatinine Equation (2021)    Anion gap 9 5 - 15    Comment: Performed at [facility], [address] [place], [place] [address]  Ethanol     Status: None   Collection Time: 04/02/23  8:21 PM  Result Value Ref Range   Alcohol, Ethyl (B) <10 <10 mg/dL    Comment: (NOTE) Lowest detectable limit for serum alcohol is 10 mg/dL.  For medical purposes only. Performed at [facility], [address]  [place], [place] [address]   CBG monitoring, ED     Status: Abnormal   Collection Time: 04/02/23  8:23 PM  Result Value Ref Range   Glucose-Capillary 142 (H) 70 - 99 mg/dL    Comment: Glucose reference range applies only to samples taken after fasting for at least 8 hours.    Lab Results  Component Value Date   CREATININE 1.02 (H) 04/02/2023   CREATININE 1.01 (H) 03/08/2023   CREATININE 0.95 01/21/2023      Latest Ref Rng & Units 04/02/2023    8:21 PM 03/08/2023    9:31 AM 01/21/2023   10:23 AM  CMP  Glucose 70 - 99 mg/dL 956  213  086   BUN 8 - 23 mg/dL 26  23  23    Creatinine 0.44 - 1.00 mg/dL 5.78  4.69  6.29   Sodium 135 - 145 mmol/L 139  141  140   Potassium 3.5 - 5.1 mmol/L 3.5  4.4  4.5   Chloride 98 - 111 mmol/L 104  104  106   CO2 22 - 32 mmol/L 26  26  24    Calcium 8.9 - 10.3 mg/dL 8.9  9.3  9.2   Total Protein 6.5 - 8.1 g/dL 6.9  7.0  6.8   Total Bilirubin 0.3 - 1.2 mg/dL 0.6  0.5  0.7   Alkaline Phos 38 - 126 U/L 63  61  51   AST 15 - 41 U/L 21  21  21    ALT 0 -  44 U/L 11  13  13     Unresulted Labs (From admission, onward)    None      Pt has received : Orders Placed This Encounter  Procedures   CT HEAD CODE STROKE WO CONTRAST    CT Head Code Stroke - for patients within the Code Stroke Window    Standing Status:   Standing    Number of Occurrences:   1    Order Specific Question:   Radiology Contrast Protocol - do NOT remove file path    Answer:   [email or web address]   Protime-INR    Standing Status:   Standing    Number of Occurrences:   1   APTT    Standing Status:   Standing    Number of Occurrences:   1   CBC    Standing Status:   Standing    Number of Occurrences:   1   Differential    Standing Status:   Standing    Number of Occurrences:   1   Comprehensive metabolic panel    Standing Status:   Standing    Number of Occurrences:   1   Ethanol    Standing Status:   Standing    Number of Occurrences:   1    Diet NPO time specified    Standing Status:   Standing    Number of Occurrences:   1   Cardiac monitoring    Standing Status:   Standing    Number of Occurrences:   1   Swallow screen (NPO until completed)    (NPO until completed)    Standing Status:   Standing    Number of Occurrences:   1   NIH Stroke Scale    On arrival and q 2h x 12h, then q 4h    Standing Status:   Standing    Number of Occurrences:   1   Activate teleneurology    Call [facility] and activate tele-neurology    Standing Status:   Standing    Number of Occurrences:   1   Saline Lock IV, Maintain IV access    Standing Status:   Standing    Number of Occurrences:   1   If O2 sat    Standing Status:   Standing    Number of Occurrences:   1   Swallow screen    Standing Status:   Standing    Number of Occurrences:   1   ED to Call Code Stroke    Call [facility] [phone]    Standing Status:   Standing    Number of Occurrences:   1    Order Specific Question:   Reason for Consult?    Answer:   Code Stroke   Consult to neurology Consult Timeframe: STAT - requires a response within one hour; STAT timeframe requires provider to provider communication, has the provider to provider communication been completed: Yes; Reason for Consult? Stroke    Standing Status:   Standing    Number of Occurrences:   1    Order Specific Question:   Consult Timeframe    Answer:   STAT - requires a response within one hour    Order Specific Question:   STAT timeframe requires provider to provider communication, has the provider to provider communication been completed    Answer:   Yes    Order Specific Question:   Reason for Consult?    Answer:   Stroke  Consult to hospitalist    Standing Status:   Standing    Number of Occurrences:   1    Order Specific Question:   Place call to:    Answer:   ED, [phone]    Order Specific Question:   Reason for Consult    Answer:   Admit    Order Specific Question:   Diagnosis/Clinical  Info for Consult:    Answer:   TIA   Pulse oximetry, continuous    Standing Status:   Standing    Number of Occurrences:   1   CBG monitoring, ED    Standing Status:   Standing    Number of Occurrences:   1   ED EKG    Altered mental status    Standing Status:   Standing    Number of Occurrences:   1    Order Specific Question:   Reason for Exam    Answer:   Other (See Comments)    Meds ordered this encounter  Medications   acetaminophen (TYLENOL) tablet 1,000 mg    Admission Imaging : CT HEAD CODE STROKE WO CONTRAST  Result Date: 04/02/2023 CLINICAL DATA:  Code stroke.  Acute neurologic deficit EXAM: CT HEAD WITHOUT CONTRAST TECHNIQUE: Contiguous axial images were obtained from the base of the skull through the vertex without intravenous contrast. RADIATION DOSE REDUCTION: This exam was performed according to the departmental dose-optimization program which includes automated exposure control, adjustment of the mA and/or kV according to patient size and/or use of iterative reconstruction technique. COMPARISON:  None Available. FINDINGS: Brain: There is no mass, hemorrhage or extra-axial collection. There is generalized atrophy without lobar predilection. There is hypoattenuation of the periventricular white matter, most commonly indicating chronic ischemic microangiopathy. Vascular: No abnormal hyperdensity of the major intracranial arteries or dural venous sinuses. No intracranial atherosclerosis. Skull: The visualized skull base, calvarium and extracranial soft tissues are normal. Sinuses/Orbits: No fluid levels or advanced mucosal thickening of the visualized paranasal sinuses. No mastoid or middle ear effusion. The orbits are normal. ASPECTS [facility] Stroke Program Early CT Score) - Ganglionic level infarction (caudate, lentiform nuclei, internal capsule, insula, M1-M3 cortex): 7 - Supraganglionic infarction (M4-M6 cortex): 3 Total score (0-10 with 10 being normal): 10 IMPRESSION: 1. No  acute intracranial abnormality. 2. ASPECTS is 10. 3. Chronic ischemic microangiopathy and volume loss. These results were called by telephone at the time of interpretation on 04/02/2023 at 8:36 pm to provider CARI TRIPLETT , who verbally acknowledged these results. Electronically Signed   By: Deatra Robinson M.D.   On: 04/02/2023 20:37   Physical Examination: Vitals:   04/02/23 2013 04/02/23 2030 04/02/23 2036 04/02/23 2045  BP:  (!) 197/98  (!) 201/97  Pulse:  69  66  Temp:  98.2 F (36.8 C)    Resp:  16  15  Height:   4\' 9"  (1.448 m)   Weight:   62 kg   SpO2: 99% 96%  95%  TempSrc:  Oral    BMI (Calculated):   29.57    Physical Exam Vitals and nursing note reviewed.  Constitutional:      General: She is not in acute distress.    Appearance: Normal appearance. She is not ill-appearing, toxic-appearing or diaphoretic.  HENT:     Head: Normocephalic and atraumatic.     Right Ear: Hearing and external ear normal.     Left Ear: Hearing and external ear normal.     Nose: Nose normal. No nasal  deformity.     Mouth/Throat:     Lips: Pink.     Mouth: Mucous membranes are moist.     Tongue: No lesions.     Pharynx: Oropharynx is clear.  Eyes:     Extraocular Movements: Extraocular movements intact.     Pupils: Pupils are equal, round, and reactive to light.  Neck:     Vascular: No carotid bruit.  Cardiovascular:     Rate and Rhythm: Normal rate and regular rhythm.     Pulses: Normal pulses.     Heart sounds: Normal heart sounds.  Pulmonary:     Effort: Pulmonary effort is normal.     Breath sounds: Normal breath sounds.  Abdominal:     General: Bowel sounds are normal. There is no distension.     Palpations: Abdomen is soft. There is no mass.     Tenderness: There is no abdominal tenderness. There is no guarding.     Hernia: No hernia is present.  Musculoskeletal:     Right lower leg: No edema.     Left lower leg: No edema.  Skin:    General: Skin is warm.  Neurological:      General: No focal deficit present.     Mental Status: She is alert and oriented to person, place, and time.     Cranial Nerves: Cranial nerves 2-12 are intact.     Motor: Motor function is intact.  Psychiatric:        Attention and Perception: Attention normal.        Mood and Affect: Mood normal.        Speech: Speech normal.        Behavior: Behavior normal. Behavior is cooperative.        Cognition and Memory: Cognition normal.     Assessment and Plan: * Aphasia Patient is at high risk for repeat CVA as she has had a CVA in December.  Her exam today is nonfocal and her deficits have resolved.  Patient has a history of A-fib which also puts her at increased risk for CVA.  Patient is compliant with her anticoagulation with Eliquis.  Per neurology recommendation we will admit patient to telemetry floor for inpatient stroke evaluation with an MRI.        Stroke/Telemetry Floor       Neuro Checks       Bedside Swallow Eval       DVT Prophylaxis       IV Fluids, Normal Saline       Head of Bed 30 Degrees       Euglycemia and Avoid Hyperthermia (PRN Acetaminophen)       Hold Anticoagulation with eliquis for Now       Initiate dual antiplatelet therapy with Aspirin 81 mg daily and Clopidogrel 75 mg daily.       Antihypertensives PRN if Blood pressure is greater than 220/120 or there is a concern for End organ damage/contraindications for permissive HTN. If blood pressure is greater than 220/120 give labetalol PO or IV or Vasotec IV with a goal of 15% reduction in BP during the first 24 hours.    PAF (paroxysmal atrial fibrillation) Eliquis currently held.  Cont asa daily.  EKG shws SR at 69.    AKI (acute kidney injury) Lab Results  Component Value Date   CREATININE 1.02 (H) 04/02/2023   CREATININE 1.01 (H) 03/08/2023   CREATININE 0.95 01/21/2023  Avoid contrast studies. Renally dose meds . Hodl  ace/arb.    Primary hypertension Coreg, chlorthalidone, losartan held for  permissive hypertension.   Diabetes mellitus without complication Npo. speech and swallow eval. Glycemic protocol with Accu-Cheks every 4 hours and as needed.  Generalized anxiety disorder Lexapro and xanax to be continued once pt passes swallow eval.   GERD (gastroesophageal reflux disease) IV PPI therapy.   CAD (coronary atherosclerotic disease) Stable chest pain free. EKG nonischemic.  Cont DAPT with asa/ plavix.   OSA on CPAP CPAP per home settings.     DVT prophylaxis:  Heparin.  Code Status:  Full code.      11/28/2022   10:30 AM  Advanced Directives  Does Patient Have a Medical Advance Directive? Yes  Does patient want to make changes to medical advance directive? No - Patient declined    Family Communication:  None.   Emergency Contact: Contact Information     Name Relation Home Work Mobile   Dubose,Martha Daughter [phone]         Disposition Plan:  TBD.   Consults: Neurology.  Admission status: Observation.    Unit / Expected LOS: Med tele/ 1 day.   Gertha Calkin MD [facility]  6 PM- 2 AM. [phone]( Pager )  For questions regarding this patient please use [email or web address] to contact the current [facility] MD.   Bonita Quin may also call [phone] to contact current Assigned [facility] Attending/Consulting MD for this patient.

## 2023-04-02 NOTE — ED Triage Notes (Signed)
EMS brings pt in from home; at 1600 began having aphasia; c/o HA; currently takes plavix and eliquist

## 2023-04-02 NOTE — Assessment & Plan Note (Signed)
IV PPI therapy.  

## 2023-04-02 NOTE — ED Provider Notes (Signed)
[facility] Provider Note    Event Date/Time   First MD Initiated Contact with Patient 04/02/23 2057     (approximate)   History   Chief Complaint: Code Stroke   HPI  Vanessa Young is a 82 y.o. female with a history of GERD, paroxysmal atrial fibrillation, diabetes who comes ED complaining of aphasia and headache that started at 4:00 PM today, has been waxing and waning recurrent symptoms.  Patient similar symptoms in December 2023.  No recent trauma.  On Eliquis.     Physical Exam   Triage Vital Signs: ED Triage Vitals  Enc Vitals Group     BP 04/02/23 2030 (!) 197/98     Pulse Rate 04/02/23 2030 69     Resp 04/02/23 2030 16     Temp 04/02/23 2030 98.2 F (36.8 C)     Temp Source 04/02/23 2030 Oral     SpO2 04/02/23 2013 99 %     Weight 04/02/23 2036 136 lb 11 oz (62 kg)     Height 04/02/23 2036 4\' 9"  (1.448 m)     Head Circumference --      Peak Flow --      Pain Score --      Pain Loc --      Pain Edu? --      Excl. in GC? --     Most recent vital signs: Vitals:   04/02/23 2030 04/02/23 2045  BP: (!) 197/98 (!) 201/97  Pulse: 69 66  Resp: 16 15  Temp: 98.2 F (36.8 C)   SpO2: 96% 95%    General: Awake, no distress.  CV:  Good peripheral perfusion.  Regular rate rhythm Resp:  Normal effort.  Abd:  No distention.  Other:  NIH stroke scale 0   ED Results / Procedures / Treatments   Labs (all labs ordered are listed, but only abnormal results are displayed) Labs Reviewed  COMPREHENSIVE METABOLIC PANEL - Abnormal; Notable for the following components:      Result Value   Glucose, Bld 151 (*)    BUN 26 (*)    Creatinine, Ser 1.02 (*)    GFR, Estimated 55 (*)    All other components within normal limits  CBG MONITORING, ED - Abnormal; Notable for the following components:   Glucose-Capillary 142 (*)    All other components within normal limits  PROTIME-INR  APTT  CBC  DIFFERENTIAL  ETHANOL  RAPID URINE DRUG SCREEN,  HOSP PERFORMED  LIPID PANEL     EKG Interpreted by me Normal sinus rhythm rate of 69.  Normal axis intervals QRS ST segments and T waves.   RADIOLOGY CT head interpreted by me, appears normal.  Radiology report reviewed.   PROCEDURES:  Procedures   MEDICATIONS ORDERED IN ED: Medications  acetaminophen (TYLENOL) tablet 1,000 mg (has no administration in time range)   stroke: early stages of recovery book (has no administration in time range)  0.9 %  sodium chloride infusion (has no administration in time range)  acetaminophen (TYLENOL) tablet 650 mg (has no administration in time range)    Or  acetaminophen (TYLENOL) 160 MG/5ML solution 650 mg (has no administration in time range)    Or  acetaminophen (TYLENOL) suppository 650 mg (has no administration in time range)  heparin injection 5,000 Units (has no administration in time range)  hydrALAZINE (APRESOLINE) injection 10 mg (has no administration in time range)     IMPRESSION / MDM / ASSESSMENT  AND PLAN / ED COURSE  I reviewed the triage vital signs and the nursing notes.  DDx: Ischemic stroke, intracranial hemorrhage, electrolyte abnormality, anemia, symptomatic hypertension  Patient's presentation is most consistent with acute presentation with potential threat to life or bodily function.  Patient presents with stroke symptoms, possibly stuttering.  Code stroke initiated initially, not a candidate for TNK due to Eliquis.   Clinical Course as of 04/02/23 2220  Tue Apr 02, 2023  2100 Case d/w neurologist. Current NIHSS = 0. Rec. Permissive hypertension up to 200 systolic, hold eliquis, admit for stroke workup, give asa + plavix [PS]    Clinical Course User Index [PS] Sharman Cheek, MD     FINAL CLINICAL IMPRESSION(S) / ED DIAGNOSES   Final diagnoses:  TIA (transient ischemic attack)     Rx / DC Orders   ED Discharge Orders     None        Note:  This document was prepared using Dragon voice  recognition software and may include unintentional dictation errors.   Sharman Cheek, MD 04/02/23 2220

## 2023-04-02 NOTE — ED Triage Notes (Signed)
Pt arrived via [facility] from home with reports of 2 episodes of expressive aphagia, inability to read or identify objects while on phone with family at 1600 and at 71. Pt arrived with all symptoms resolved. Family with pt reports pt having same symptoms in December with diagnoses of CVA. Pt currently on Plavix and Eliquis.   **LKW 1500 ** Symptoms (1st episode) 1400 ** Symptoms (2nd episode) 1900 ** CT 2025 ** Tele-Neuro 2026 ** CT to RM 2032 ** Tele-Neuro MD 2042

## 2023-04-03 DIAGNOSIS — R4701 Aphasia: Secondary | ICD-10-CM

## 2023-04-03 LAB — LIPID PANEL
Cholesterol: 111 mg/dL (ref 0–200)
HDL: 34 mg/dL — ABNORMAL LOW (ref >40)
LDL Cholesterol: 32 mg/dL (ref 0–99)
Total CHOL/HDL Ratio: 3.3 RATIO
Triglycerides: 226 mg/dL — ABNORMAL HIGH (ref <150)
VLDL: 45 mg/dL — ABNORMAL HIGH (ref 0–40)

## 2023-04-03 LAB — URINE DRUG SCREEN, QUALITATIVE (ARMC ONLY)
Amphetamines, Ur Screen: NOT DETECTED
Barbiturates, Ur Screen: NOT DETECTED
Benzodiazepine, Ur Scrn: NOT DETECTED
Cannabinoid 50 Ng, Ur ~~LOC~~: NOT DETECTED
Cocaine Metabolite,Ur ~~LOC~~: NOT DETECTED
MDMA (Ecstasy)Ur Screen: NOT DETECTED
Methadone Scn, Ur: NOT DETECTED
Opiate, Ur Screen: NOT DETECTED
Phencyclidine (PCP) Ur S: NOT DETECTED
Tricyclic, Ur Screen: NOT DETECTED

## 2023-04-03 LAB — CBG MONITORING, ED: Glucose-Capillary: 132 mg/dL — ABNORMAL HIGH (ref 70–99)

## 2023-04-03 MED ORDER — APIXABAN 5 MG PO TABS: 5.0000 mg | ORAL_TABLET | Freq: Two times a day (BID) | ORAL | Status: DC

## 2023-04-03 MED ORDER — ALPRAZOLAM 0.25 MG PO TABS: 0.2500 mg | ORAL_TABLET | Freq: Every evening | ORAL | Status: DC | PRN

## 2023-04-03 MED ORDER — CARVEDILOL 6.25 MG PO TABS: 12.5000 mg | ORAL_TABLET | Freq: Two times a day (BID) | ORAL | Status: DC

## 2023-04-03 MED ORDER — ROSUVASTATIN CALCIUM 20 MG PO TABS: 20.0000 mg | ORAL_TABLET | Freq: Every day | ORAL | Status: DC

## 2023-04-03 MED ORDER — ASPIRIN 81 MG PO CHEW: 81.0000 mg | CHEWABLE_TABLET | Freq: Every day | ORAL | Status: DC

## 2023-04-03 MED ORDER — CLOPIDOGREL BISULFATE 75 MG PO TABS: 75.0000 mg | ORAL_TABLET | Freq: Every day | ORAL | 0 refills | Status: AC

## 2023-04-03 MED ORDER — CHLORTHALIDONE 25 MG PO TABS
12.5000 mg | ORAL_TABLET | Freq: Every day | ORAL | Status: DC
  Filled 2023-04-03: qty 0.5

## 2023-04-03 MED ORDER — ASPIRIN 81 MG PO CHEW
81.0000 mg | CHEWABLE_TABLET | Freq: Once | ORAL | Status: AC
  Administered 2023-04-03: 81 mg via ORAL
  Filled 2023-04-03: qty 1

## 2023-04-03 MED ORDER — ESCITALOPRAM OXALATE 10 MG PO TABS: 10.0000 mg | ORAL_TABLET | Freq: Every day | ORAL | Status: DC

## 2023-04-03 MED ORDER — PANTOPRAZOLE SODIUM 40 MG PO TBEC: 40.0000 mg | DELAYED_RELEASE_TABLET | Freq: Every day | ORAL | Status: DC

## 2023-04-03 NOTE — ED Notes (Signed)
AVS with prescriptions provided to and discussed with patient. Pt verbalizes understanding of discharge instructions and denies any questions or concerns at this time. Pt has ride home. Pt ambulated out of department independently with steady gait.  

## 2023-04-03 NOTE — Discharge Summary (Signed)
Physician Discharge Summary  NIERRA SCHOLLE TRZ:[id number] DOB: 1941-11-20 DOA: 04/02/2023  PCP: Filbert Berthold, MD  Admit date: 04/02/2023 Discharge date: 04/03/2023  Admitted From: Home Disposition:  Home  Recommendations for Outpatient Follow-up:  Follow up with PCP in 1-2 weeks Ambulatory referral to [facility] rehab services Follow-up if desired in 4 to 6 weeks with [facility] neurology  Home Health: No Equipment/Devices: None  Discharge Condition: Stable CODE STATUS: Full Diet recommendation: Heart healthy  Brief/Interim Summary: 82 y.o. female seen in ed for aphasia and code stroke activated.  Per nurses note pt reports two episode of aphasia and inability to read or identify objects on phone with family member at 1600 and 1900. Similar symptoms in December when pt had a CVA, patient was discharged on Eliquis and Plavix.  Patient also has a history of paroxysmal atrial fibrillation for which patient was continued on Eliquis and Coreg.. Pt noted to be on plavix and eliquis.  Telestroke cart was started at 2026 last known normal at 1400 with her expressive aphasia and apraxia.  Patient criteria for emergent intervention with TPA or tnkase.  Patient seen by teleneurology.  MRI imaging survey unrevealing.  No evidence of acute infarct or large vessel occlusion.  Patient evaluated the following morning.  Is neurologically intact.  Aphasia/word finding difficulties has resolved.  Patient is already on antiplatelet and anticoagulation.  No changes made at home medication regimen.  Stable for discharge home at this time.  Follow-up outpatient PCP.  Consider follow-up with neurology, office phone number for follow-up instructions provided for [facility] neurology.  Ambulatory referral to outpatient physical therapy services.    Discharge Diagnoses:  Principal Problem:   Aphasia Active Problems:   PAF (paroxysmal atrial fibrillation)   AKI (acute kidney injury)   Primary hypertension   Diabetes  mellitus without complication   Generalized anxiety disorder   GERD (gastroesophageal reflux disease)   OSA on CPAP   CAD (coronary atherosclerotic disease)  Aphasia/word finding difficulties Unknown etiology.  Unable to exclude TIA.  No imaging evidence of CVA.  Patient back to baseline and neurologically intact at time of discharge.  Can resume antiplatelet and anticoagulation as per prior to arrival.  Discharge Instructions  Discharge Instructions     Ambulatory referral to Physical Therapy   Complete by: As directed    Diet - low sodium heart healthy   Complete by: As directed    Increase activity slowly   Complete by: As directed       Allergies as of 04/03/2023       Reactions   Keflex [cephalexin] Hives   Hydrochlorothiazide W-triamterene Nausea Only   Lisinopril Other (See Comments)   Unknown    Penicillins Hives   Statins Other (See Comments)   Unknown         Medication List     STOP taking these medications    guanFACINE 1 MG tablet Commonly known as: TENEX       TAKE these medications    Accu-Chek Guide test strip Generic drug: glucose blood   ALPRAZolam 0.25 MG tablet Commonly known as: XANAX Take 0.25 mg by mouth at bedtime as needed for sleep.   apixaban 5 MG Tabs tablet Commonly known as: ELIQUIS Take 1 tablet (5 mg total) by mouth 2 (two) times daily.   carvedilol 12.5 MG tablet Commonly known as: COREG Take 1 tablet (12.5 mg total) by mouth 2 (two) times daily.   chlorthalidone 25 MG tablet Commonly known as: HYGROTON Take  0.5 tablets (12.5 mg total) by mouth daily.   clopidogrel 75 MG tablet Commonly known as: PLAVIX Take 1 tablet (75 mg total) by mouth daily.   escitalopram 10 MG tablet Commonly known as: LEXAPRO Take 10 mg by mouth daily.   losartan 100 MG tablet Commonly known as: COZAAR Take 1 tablet (100 mg total) by mouth daily.   magnesium oxide 400 (240 Mg) MG tablet Commonly known as: MAG-OX Take 1 tablet by  mouth daily.   metFORMIN 500 MG (MOD) 24 hr tablet Commonly known as: GLUMETZA Take 500 mg by mouth 2 (two) times daily with a meal.   pantoprazole 40 MG tablet Commonly known as: Protonix Take 1 tablet (40 mg total) by mouth daily.   rosuvastatin 20 MG tablet Commonly known as: CRESTOR Take 1 tablet (20 mg total) by mouth daily.        Follow-up Information     Filbert Berthold, MD. Schedule an appointment as soon as possible for a visit in 1 week(s).   Specialty: Family Medicine Contact information: [address] [place] [address] [phone]         Lonell Face, MD Follow up.   Specialty: Neurology Why: Follow up in 4-6 weeks if desired or recommended by your primary care physician Contact information: [address] [facility] [place] [address] [phone]                Allergies  Allergen Reactions   Keflex [Cephalexin] Hives   Hydrochlorothiazide W-Triamterene Nausea Only   Lisinopril Other (See Comments)    Unknown     Penicillins Hives   Statins Other (See Comments)    Unknown     Consultations: None   Procedures/Studies: MR BRAIN WO CONTRAST  Result Date: 04/02/2023 CLINICAL DATA:  Aphasia EXAM: MRI HEAD WITHOUT CONTRAST MRA HEAD WITHOUT CONTRAST MRA NECK WITHOUT AND WITH CONTRAST TECHNIQUE: Multiplanar, multi-echo pulse sequences of the brain and surrounding structures were acquired without intravenous contrast. Angiographic images of the Circle of Willis were acquired using MRA technique without intravenous contrast. Angiographic images of the neck were acquired using MRA technique without and with intravenous contrast. Carotid stenosis measurements (when applicable) are obtained utilizing NASCET criteria, using the distal internal carotid diameter as the denominator. CONTRAST:  6mL GADAVIST GADOBUTROL 1 MMOL/ML IV SOLN COMPARISON:  11/23/2022 FINDINGS: MRI HEAD FINDINGS Brain: No  acute infarct, mass effect or extra-axial collection. No acute or chronic hemorrhage. There is confluent hyperintense T2-weighted signal within the white matter. Generalized volume loss. The midline structures are normal. Vascular: Major flow voids are preserved. Skull and upper cervical spine: Normal calvarium and skull base. Visualized upper cervical spine and soft tissues are normal. Sinuses/Orbits:No paranasal sinus fluid levels or advanced mucosal thickening. No mastoid or middle ear effusion. Normal orbits. MRA HEAD FINDINGS POSTERIOR CIRCULATION: --Vertebral arteries: Normal --Inferior cerebellar arteries: Normal. --Basilar artery: Normal. --Superior cerebellar arteries: Normal. --Posterior cerebral arteries: Normal. ANTERIOR CIRCULATION: --Intracranial internal carotid arteries: Normal. --Anterior cerebral arteries (ACA): Normal. Hypoplastic right A1 segment, normal variant. --Middle cerebral arteries (MCA): Mild narrowing of the right MCA M1 segment. Normal left MCA. MRA NECK FINDINGS Aortic arch: Normal 3 vessel branching pattern Right carotid system: No stenosis or other abnormality Left carotid system: No stenosis or other abnormality Vertebral arteries: Left dominant system.  Normal. Other: None IMPRESSION: 1. No acute intracranial abnormality. 2. No emergent large vessel occlusion or high-grade stenosis. 3. Mild narrowing of the right MCA M1 segment.  4. 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 Electronically Signed   By: Deatra Robinson M.D.   On: 04/02/2023 23:38   MR ANGIO HEAD WO CONTRAST  Result Date: 04/02/2023 CLINICAL DATA:  Aphasia EXAM: MRI HEAD WITHOUT CONTRAST MRA HEAD WITHOUT CONTRAST MRA NECK WITHOUT AND WITH CONTRAST TECHNIQUE: Multiplanar, multi-echo pulse sequences of the brain and surrounding structures were acquired without intravenous contrast. Angiographic images of the Circle of Willis were acquired using MRA technique without intravenous contrast. Angiographic  images of the neck were acquired using MRA technique without and with intravenous contrast. Carotid stenosis measurements (when applicable) are obtained utilizing NASCET criteria, using the distal internal carotid diameter as the denominator. CONTRAST:  31mL GADAVIST GADOBUTROL 1 MMOL/ML IV SOLN COMPARISON:  11/23/2022 FINDINGS: MRI HEAD FINDINGS Brain: No acute infarct, mass effect or extra-axial collection. No acute or chronic hemorrhage. There is confluent hyperintense T2-weighted signal within the white matter. Generalized volume loss. The midline structures are normal. Vascular: Major flow voids are preserved. Skull and upper cervical spine: Normal calvarium and skull base. Visualized upper cervical spine and soft tissues are normal. Sinuses/Orbits:No paranasal sinus fluid levels or advanced mucosal thickening. No mastoid or middle ear effusion. Normal orbits. MRA HEAD FINDINGS POSTERIOR CIRCULATION: --Vertebral arteries: Normal --Inferior cerebellar arteries: Normal. --Basilar artery: Normal. --Superior cerebellar arteries: Normal. --Posterior cerebral arteries: Normal. ANTERIOR CIRCULATION: --Intracranial internal carotid arteries: Normal. --Anterior cerebral arteries (ACA): Normal. Hypoplastic right A1 segment, normal variant. --Middle cerebral arteries (MCA): Mild narrowing of the right MCA M1 segment. Normal left MCA. MRA NECK FINDINGS Aortic arch: Normal 3 vessel branching pattern Right carotid system: No stenosis or other abnormality Left carotid system: No stenosis or other abnormality Vertebral arteries: Left dominant system.  Normal. Other: None IMPRESSION: 1. No acute intracranial abnormality. 2. No emergent large vessel occlusion or high-grade stenosis. 3. Mild narrowing of the right MCA M1 segment. 4. 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 Electronically Signed   By: Deatra Robinson M.D.   On: 04/02/2023 23:38   MR ANGIO NECK W WO CONTRAST  Result Date: 04/02/2023 CLINICAL  DATA:  Aphasia EXAM: MRI HEAD WITHOUT CONTRAST MRA HEAD WITHOUT CONTRAST MRA NECK WITHOUT AND WITH CONTRAST TECHNIQUE: Multiplanar, multi-echo pulse sequences of the brain and surrounding structures were acquired without intravenous contrast. Angiographic images of the Circle of Willis were acquired using MRA technique without intravenous contrast. Angiographic images of the neck were acquired using MRA technique without and with intravenous contrast. Carotid stenosis measurements (when applicable) are obtained utilizing NASCET criteria, using the distal internal carotid diameter as the denominator. CONTRAST:  56mL GADAVIST GADOBUTROL 1 MMOL/ML IV SOLN COMPARISON:  11/23/2022 FINDINGS: MRI HEAD FINDINGS Brain: No acute infarct, mass effect or extra-axial collection. No acute or chronic hemorrhage. There is confluent hyperintense T2-weighted signal within the white matter. Generalized volume loss. The midline structures are normal. Vascular: Major flow voids are preserved. Skull and upper cervical spine: Normal calvarium and skull base. Visualized upper cervical spine and soft tissues are normal. Sinuses/Orbits:No paranasal sinus fluid levels or advanced mucosal thickening. No mastoid or middle ear effusion. Normal orbits. MRA HEAD FINDINGS POSTERIOR CIRCULATION: --Vertebral arteries: Normal --Inferior cerebellar arteries: Normal. --Basilar artery: Normal. --Superior cerebellar arteries: Normal. --Posterior cerebral arteries: Normal. ANTERIOR CIRCULATION: --Intracranial internal carotid arteries: Normal. --Anterior cerebral arteries (ACA): Normal. Hypoplastic right A1 segment, normal variant. --Middle cerebral arteries (MCA): Mild narrowing of the right MCA M1 segment. Normal left MCA. MRA NECK FINDINGS Aortic arch: Normal 3 vessel  branching pattern Right carotid system: No stenosis or other abnormality Left carotid system: No stenosis or other abnormality Vertebral arteries: Left dominant system.  Normal. Other: None  IMPRESSION: 1. No acute intracranial abnormality. 2. No emergent large vessel occlusion or high-grade stenosis. 3. Mild narrowing of the right MCA M1 segment. 4. 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 Electronically Signed   By: Deatra Robinson M.D.   On: 04/02/2023 23:38   CT HEAD CODE STROKE WO CONTRAST  Result Date: 04/02/2023 CLINICAL DATA:  Code stroke.  Acute neurologic deficit EXAM: CT HEAD WITHOUT CONTRAST TECHNIQUE: Contiguous axial images were obtained from the base of the skull through the vertex without intravenous contrast. RADIATION DOSE REDUCTION: This exam was performed according to the departmental dose-optimization program which includes automated exposure control, adjustment of the mA and/or kV according to patient size and/or use of iterative reconstruction technique. COMPARISON:  None Available. FINDINGS: Brain: There is no mass, hemorrhage or extra-axial collection. There is generalized atrophy without lobar predilection. There is hypoattenuation of the periventricular white matter, most commonly indicating chronic ischemic microangiopathy. Vascular: No abnormal hyperdensity of the major intracranial arteries or dural venous sinuses. No intracranial atherosclerosis. Skull: The visualized skull base, calvarium and extracranial soft tissues are normal. Sinuses/Orbits: No fluid levels or advanced mucosal thickening of the visualized paranasal sinuses. No mastoid or middle ear effusion. The orbits are normal. ASPECTS [facility] Stroke Program Early CT Score) - Ganglionic level infarction (caudate, lentiform nuclei, internal capsule, insula, M1-M3 cortex): 7 - Supraganglionic infarction (M4-M6 cortex): 3 Total score (0-10 with 10 being normal): 10 IMPRESSION: 1. No acute intracranial abnormality. 2. ASPECTS is 10. 3. Chronic ischemic microangiopathy and volume loss. These results were called by telephone at the time of interpretation on 04/02/2023 at 8:36 pm to provider CARI  TRIPLETT , who verbally acknowledged these results. Electronically Signed   By: Deatra Robinson M.D.   On: 04/02/2023 20:37      Subjective: Seen and examined on the day of discharge.  Stable no distress.  Appropriate for discharge home.  Discharge Exam: Vitals:   04/03/23 0333 04/03/23 0700  BP:  (!) 152/76  Pulse:  62  Resp:  20  Temp: 98.4 F (36.9 C)   SpO2:  96%   Vitals:   04/03/23 0100 04/03/23 0330 04/03/23 0333 04/03/23 0700  BP: (!) 165/79 (!) 159/90  (!) 152/76  Pulse: 73 72  62  Resp: 20 (!) 21  20  Temp:   98.4 F (36.9 C)   TempSrc:   Oral   SpO2: 93% 93%  96%  Weight:      Height:        General: Pt is alert, awake, not in acute distress Cardiovascular: RRR, S1/S2 +, no rubs, no gallops Respiratory: CTA bilaterally, no wheezing, no rhonchi Abdominal: Soft, NT, ND, bowel sounds + Extremities: no edema, no cyanosis    The results of significant diagnostics from this hospitalization (including imaging, microbiology, ancillary and laboratory) are listed below for reference.     Microbiology: No results found for this or any previous visit (from the past 240 hour(s)).   Labs: BNP (last 3 results) No results for input(s): "BNP" in the last 8760 hours. Basic Metabolic Panel: Recent Labs  Lab 04/02/23 2021  NA 139  K 3.5  CL 104  CO2 26  GLUCOSE 151*  BUN 26*  CREATININE 1.02*  CALCIUM 8.9   Liver Function Tests: Recent Labs  Lab 04/02/23 2021  AST  21  ALT 11  ALKPHOS 63  BILITOT 0.6  PROT 6.9  ALBUMIN 3.8   No results for input(s): "LIPASE", "AMYLASE" in the last 168 hours. No results for input(s): "AMMONIA" in the last 168 hours. CBC: Recent Labs  Lab 04/02/23 2021  WBC 7.6  NEUTROABS 5.8  HGB 12.3  HCT 39.2  MCV 86.0  PLT 225   Cardiac Enzymes: No results for input(s): "CKTOTAL", "CKMB", "CKMBINDEX", "TROPONINI" in the last 168 hours. BNP: Invalid input(s): "POCBNP" CBG: Recent Labs  Lab 04/02/23 2023 04/03/23 0513   GLUCAP 142* 132*   D-Dimer No results for input(s): "DDIMER" in the last 72 hours. Hgb A1c No results for input(s): "HGBA1C" in the last 72 hours. Lipid Profile Recent Labs    04/03/23 0513  CHOL 111  HDL 34*  LDLCALC 32  TRIG 161*  CHOLHDL 3.3   Thyroid function studies No results for input(s): "TSH", "T4TOTAL", "T3FREE", "THYROIDAB" in the last 72 hours.  Invalid input(s): "FREET3" Anemia work up No results for input(s): "VITAMINB12", "FOLATE", "FERRITIN", "TIBC", "IRON", "RETICCTPCT" in the last 72 hours. Urinalysis    Component Value Date/Time   COLORURINE YELLOW 08/23/2021 0957   APPEARANCEUR CLOUDY (A) 08/23/2021 0957   APPEARANCEUR CLOUDY 12/07/2013 1015   LABSPEC 1.015 08/23/2021 0957   LABSPEC 1.010 12/07/2013 1015   PHURINE 7.5 08/23/2021 0957   GLUCOSEU NEGATIVE 08/23/2021 0957   GLUCOSEU NEGATIVE 12/07/2013 1015   HGBUR SMALL (A) 08/23/2021 0957   BILIRUBINUR NEGATIVE 08/23/2021 0957   BILIRUBINUR NEGATIVE 12/07/2013 1015   KETONESUR NEGATIVE 08/23/2021 0957   PROTEINUR 30 (A) 08/23/2021 0957   UROBILINOGEN 0.2 06/03/2021 1524   NITRITE POSITIVE (A) 08/23/2021 0957   LEUKOCYTESUR MODERATE (A) 08/23/2021 0957   LEUKOCYTESUR 2+ 12/07/2013 1015   Sepsis Labs Recent Labs  Lab 04/02/23 2021  WBC 7.6   Microbiology No results found for this or any previous visit (from the past 240 hour(s)).   Time coordinating discharge: Over 30 minutes  SIGNED:   Tresa Moore, MD  [facility] 04/03/2023, 8:13 AM Pager   If 7PM-7AM, please contact night-coverage

## 2023-04-03 NOTE — Progress Notes (Signed)
PT Cancellation Note  Patient Details Name: Vanessa Young MRN: [id number] DOB: [birth year]   Cancelled Treatment:    Reason Eval/Treat Not Completed: PT screened, no needs identified, will sign off. Orders received and chart reviewed. Upon entry pt independent with all transfers and gait ambulating ~100'. Pt with symmetrical LE strength and normal RAMPS and coordination. Pt reports independently completing toileting tasks while in the ED. Pt without acute needs. PT to sign off.    Delphia Grates. Fairly IV, PT, DPT Physical Therapist- [place]  [facility]  04/03/2023, 8:54 AM

## 2023-04-04 NOTE — Progress Notes (Signed)
Cardiology Office Note    Date:  04/12/2023   ID:  Vanessa Young, DOB Aug 01, 1941, MRN [id number]  PCP:  Filbert Berthold, MD  Cardiologist:  Lorine Bears, MD  Electrophysiologist:  None   Chief Complaint: Follow-up  History of Present Illness:   Vanessa Young is a 82 y.o. female with history of CAD with inferior STEMI in 11/2022 s/p PCI/DES to the RCA, PAF on apixaban, CVA/TIA, DM2, HTN, and HLD who presents for follow up of CAD and Afib.   She was previously followed by Drs. Prudence Davidson, with [facility], for A-fib with transition of her care to our office during admission in 11/2022.   Prior to her admission in 11/2022, she had no known history of ischemic heart disease.  She was seen in the ED on 11/18/2022 with generalized weakness and headache and felt to be volume depleted with symptomatic improvement following IV fluids.  Following discharge, she began to have substernal chest pain described as a burning sensation across the whole chest radiating into the bilateral arms.  She slept and felt better, though was woken up with substernal chest tightness and heartburn.  EMS was called with EKG in the field showing evidence of an inferior ST elevation MI leading to activation of code STEMI.  Upon cardiology evaluation, she was still having angina.  She reported that she did not take Xarelto daily, instead took this 3 times weekly.  Emergent LHC showed severe three-vessel CAD with the culprit lesion being a subtotal occlusion of the mid RCA.  In addition, there was an occluded OM1 with collaterals and diffuse subocclusive disease throughout the mid and distal LAD.  Moderately reduced LV systolic function estimated at 35 to 45% with severely elevated LVEDP at 35 mmHg.  She underwent successful PCI/DES to the mid RCA.  It was felt the LAD was not optimal for PCI given long diffuse disease with medical therapy favored.  Post intervention echo during the admission showed an EF of 60 to 65%, mild  hypokinesis of the basal mid inferior wall and inferolateral wall, mild LVH, grade 1 diastolic dysfunction, normal RV systolic function and ventricular cavity size, and trivial aortic insufficiency.  High-sensitivity troponin peaked at 3612.  From a cardiac perspective, she was discharged on apixaban, clopidogrel, carvedilol, amlodipine, losartan, and rosuvastatin.   Following discharge, she was readmitted to the hospital with sudden onset of left-sided weakness with expressive aphasia and dysarthria which resolved prior to presentation.  MRI of the brain notable for multiple small embolic infarcts.  She was evaluated by neurology with recommendation to continue apixaban and clopidogrel as well as recently titrated dose of rosuvastatin.  Prilosec was changed to Protonix.    She was last seen in the office in 01/2023 and remained without symptoms of angina or cardiac decompensation.  She was seen in the ED on 04/02/2023 with aphasia and headache that started several hours prior.  BP in the 190s to low 200s systolic in the ER.  CT head showed no acute intracranial abnormality.  Neurology was tele-consulted.  MRI of the brain showed no acute intracranial abnormality.  MRA of the head/neck showed no emergent large vessel occlusive disease or high-grade stenosis with mild narrowing of the right MCA M1 segment and findings consistent with chronic small vessel ischemia and volume loss.  Patient returned to baseline without further intervention.  No changes were made in outpatient medications.     She comes in today and is doing well from a  cardiac perspective.  She does report some exertional chest heaviness that radiates superiorly into her neck if she is exerting herself with activity such as moving a planter.  She also indicates that these episodes either preceded more common after episodes of belching.  She reports this belching has been going on for quite a while, predating her MI.  She reports adherence to  apixaban and clopidogrel, not missing any doses, taking apixaban twice daily.  No falls, hematochezia, or melena.  No dizziness, presyncope, or syncope.  In total, her episode of headache with aphasia earlier this month lasted for 15 minutes followed by spontaneous resolution.  No residual deficits.  No further episodes.  Since discontinuing amlodipine her lower extremity swelling has resolved.  She would like to be more active at baseline.   Labs independently reviewed: 03/2023 -TC 111, TG 226, HDL 34, LDL 32, potassium 3.5, BUN 26, serum creatinine 1.02, albumin 3.8, AST/ALT normal, Hgb 12.3, PLT 225 11/2022 - magnesium 2.0, LP(a) 17.6, A1c 7.2   Past Medical History:  Diagnosis Date   A-fib (HCC)    Depression    Diabetes mellitus without complication (HCC)    GERD (gastroesophageal reflux disease)    Hypercholesteremia    Hypertension     Past Surgical History:  Procedure Laterality Date   CORONARY/GRAFT ACUTE MI REVASCULARIZATION N/A 11/19/2022   Procedure: Coronary/Graft Acute MI Revascularization;  Surgeon: Iran Ouch, MD;  Location: [facility] INVASIVE CV LAB;  Service: Cardiovascular;  Laterality: N/A;   CYSTOSCOPY     LEFT HEART CATH AND CORONARY ANGIOGRAPHY N/A 11/19/2022   Procedure: LEFT HEART CATH AND CORONARY ANGIOGRAPHY;  Surgeon: Iran Ouch, MD;  Location: [facility] INVASIVE CV LAB;  Service: Cardiovascular;  Laterality: N/A;    Current Medications: Current Meds  Medication Sig   ALPRAZolam (XANAX) 0.25 MG tablet Take 0.25 mg by mouth at bedtime as needed for sleep.   apixaban (ELIQUIS) 5 MG TABS tablet Take 1 tablet (5 mg total) by mouth 2 (two) times daily.   carvedilol (COREG) 12.5 MG tablet Take 1 tablet (12.5 mg total) by mouth 2 (two) times daily.   chlorthalidone (HYGROTON) 25 MG tablet Take 0.5 tablets (12.5 mg total) by mouth daily.   clopidogrel (PLAVIX) 75 MG tablet Take 1 tablet (75 mg total) by mouth daily.   escitalopram (LEXAPRO) 10 MG tablet Take 10  mg by mouth daily.   isosorbide mononitrate (IMDUR) 30 MG 24 hr tablet Take 1 tablet (30 mg total) by mouth daily.   losartan (COZAAR) 100 MG tablet Take 1 tablet (100 mg total) by mouth daily.   magnesium oxide (MAG-OX) 400 (240 Mg) MG tablet Take 1 tablet by mouth daily.   metFORMIN (GLUMETZA) 500 MG (MOD) 24 hr tablet Take 500 mg by mouth 2 (two) times daily with a meal.   pantoprazole (PROTONIX) 40 MG tablet Take 1 tablet (40 mg total) by mouth daily.   rosuvastatin (CRESTOR) 20 MG tablet Take 1 tablet (20 mg total) by mouth daily.    Allergies:   Keflex [cephalexin], Hydrochlorothiazide w-triamterene, Lisinopril, Penicillins, and Statins   Social History   Socioeconomic History   Marital status: Widowed    Spouse name: Not on file   Number of children: Not on file   Years of education: Not on file   Highest education level: Not on file  Occupational History   Not on file  Tobacco Use   Smoking status: Former    Packs/day: 0.25    Years:  20.00    Additional pack years: 0.00    Total pack years: 5.00    Types: Cigarettes    Quit date: 2002    Years since quitting: 22.3   Smokeless tobacco: Never  Vaping Use   Vaping Use: Never used  Substance and Sexual Activity   Alcohol use: No   Drug use: No   Sexual activity: Not on file  Other Topics Concern   Not on file  Social History Narrative   Not on file   Social Determinants of Health   Financial Resource Strain: Not on file  Food Insecurity: No Food Insecurity (11/19/2022)   Hunger Vital Sign    Worried About Running Out of Food in the Last Year: Never true    Ran Out of Food in the Last Year: Never true  Transportation Needs: No Transportation Needs (11/19/2022)   PRAPARE - Administrator, Civil Service (Medical): No    Lack of Transportation (Non-Medical): No  Physical Activity: Not on file  Stress: Not on file  Social Connections: Not on file     Family History:  The patient's family history  includes CVA in her father and mother; Diabetes in her mother.  ROS:   12-point review of systems is negative unless otherwise noted in the HPI.   EKGs/Labs/Other Studies Reviewed:    Studies reviewed were summarized above. The additional studies were reviewed today:  2D echo 11/10/2018: - Left ventricle: Wall thickness was increased in a pattern of mild    LVH. Systolic function was normal. The estimated ejection    fraction was in the range of 55% to 60%. Doppler parameters are    consistent with abnormal left ventricular relaxation (grade 1    diastolic dysfunction).  - Right ventricle: The cavity size was mildly dilated.  - Pulmonary arteries: PA peak pressure: 37 mm Hg (S).  __________   LHC 11/19/2022:   1st Mrg lesion is 100% stenosed.   Prox LAD to Mid LAD lesion is 40% stenosed.   1st Diag lesion is 60% stenosed.   Mid LAD to Dist LAD lesion is 99% stenosed.   RPDA lesion is 85% stenosed.   Prox RCA to Mid RCA lesion is 40% stenosed.   Mid RCA to Dist RCA lesion is 99% stenosed.   A drug-eluting stent was successfully placed using a STENT ONYX FRONTIER 3.5X30.   Post intervention, there is a 0% residual stenosis.   There is moderate left ventricular systolic dysfunction.   LV end diastolic pressure is severely elevated.   The left ventricular ejection fraction is 35-45% by visual estimate.   1.  Severe three-vessel coronary artery disease.  The culprit is a subtotal occlusion of the mid right coronary artery.  In addition, the patient has occluded OM1 with collaterals and diffuse subocclusive disease throughout the mid and distal LAD. 2.  Moderately reduced LV systolic function with severely elevated left ventricular end-diastolic pressure 35 mmHg. 3.  Successful angioplasty and drug-eluting stent placement to the mid right coronary artery. 4.  Door to device was delayed by difficult access via the right radial artery given calcified, tortuous and stenosed proximal radial  artery.  This was navigated successfully with a run-through wire using balloon assisted tracking.   Recommendations: Will treat with aspirin and ticagrelor for now. The patient is chronically anticoagulated for atrial fibrillation.  Anticoagulation can likely be resumed tomorrow if no bleeding issues but should consider switching Xarelto to Eliquis and stopping aspirin to  minimize the risk of bleeding considering her age. I do not think the LAD is optimal for PCI given long diffuse disease.  Favor medical therapy. __________   2D echo 11/20/2022: 1. Left ventricular ejection fraction, by estimation, is 60 to 65%. The  left ventricle has normal function. The left ventricle demonstrates  regional wall motion abnormalities (see scoring diagram/findings for  description). There is mild left ventricular   hypertrophy. Left ventricular diastolic parameters are consistent with  Grade I diastolic dysfunction (impaired relaxation). Elevated left atrial  pressure. There is mild hypokinesis of the left ventricular, basal-mid  inferior wall and inferolateral wall.   2. Right ventricular systolic function is normal. The right ventricular  size is normal. Tricuspid regurgitation signal is inadequate for assessing  PA pressure.   3. The mitral valve is normal in structure. No evidence of mitral valve  regurgitation. No evidence of mitral stenosis.   4. The aortic valve is tricuspid. Aortic valve regurgitation is trivial.  No aortic stenosis is present.    EKG:  EKG is ordered today.  The EKG ordered today demonstrates NSR, 61 bpm, nonspecific ST-T changes  Recent Labs: 11/22/2022: Magnesium 2.0 04/02/2023: ALT 11; BUN 26; Creatinine, Ser 1.02; Hemoglobin 12.3; Platelets 225; Potassium 3.5; Sodium 139  Recent Lipid Panel    Component Value Date/Time   CHOL 111 04/03/2023 0513   TRIG 226 (H) 04/03/2023 0513   HDL 34 (L) 04/03/2023 0513   CHOLHDL 3.3 04/03/2023 0513   VLDL 45 (H) 04/03/2023 0513    LDLCALC 32 04/03/2023 0513   LDLDIRECT 154 (H) 11/19/2022 1446    PHYSICAL EXAM:    VS:  BP 138/80   Pulse 61   Ht 4\' 9"  (1.448 m)   Wt 134 lb (60.8 kg)   BMI 29.00 kg/m   BMI: Body mass index is 29 kg/m.  Physical Exam Vitals reviewed.  Constitutional:      Appearance: She is well-developed.  HENT:     Head: Normocephalic and atraumatic.  Eyes:     General:        Right eye: No discharge.        Left eye: No discharge.  Neck:     Vascular: No JVD.  Cardiovascular:     Rate and Rhythm: Normal rate and regular rhythm.     Heart sounds: Normal heart sounds, S1 normal and S2 normal. Heart sounds not distant. No midsystolic click and no opening snap. No murmur heard.    No friction rub.  Pulmonary:     Effort: Pulmonary effort is normal. No respiratory distress.     Breath sounds: Normal breath sounds. No decreased breath sounds, wheezing or rales.  Chest:     Chest wall: No tenderness.  Abdominal:     General: There is no distension.  Musculoskeletal:     Cervical back: Normal range of motion.     Right lower leg: No edema.     Left lower leg: No edema.  Skin:    General: Skin is warm and dry.     Nails: There is no clubbing.  Neurological:     Mental Status: She is alert and oriented to person, place, and time.  Psychiatric:        Speech: Speech normal.        Behavior: Behavior normal.        Thought Content: Thought content normal.        Judgment: Judgment normal.     Wt Readings from  Last 3 Encounters:  04/12/23 134 lb (60.8 kg)  04/02/23 136 lb 11 oz (62 kg)  02/18/23 136 lb 11.2 oz (62 kg)     ASSESSMENT & PLAN:   CAD involving the native coronary arteries with recent inferior STEMI with stable angina: She is without symptoms of angina or cardiac decompensation at this time.  She does report some exertional chest heaviness.  She does have residual stenosis along the LAD that is not optimal for PCI given the long diffuse disease.  Add Imdur 30 mg  daily.  Continue apixaban (given A-fib) and clopidogrel without interruption for a minimum of 12 months dating back to date of PCI (11/19/2022) along with continuation of carvedilol, losartan, and rosuvastatin.  She has graduated from cardiac rehab.  PAF: Maintaining sinus rhythm with carvedilol 12.5 mg twice daily.  Heart rate precludes titration of beta-blocker.  CHA2DS2-VASc at least 8.  She remains on apixaban 5 mg twice daily and does not meet reduced dosing criteria.  No symptoms concerning for bleeding.  Recent labs showed stable renal function, electrolytes, and Hgb.  HTN: Blood pressure is mildly elevated in the office today.  Add Imdur 30 mg as outlined above with continuation of carvedilol 12.5 mg twice daily, chlorthalidone 12.5 mg daily and losartan 100 mg.  HLD: LDL 44 with a triglyceride of 226 with normal AST/ALT in 01/2023.  She remains on Crestor 20 mg.  Did not tolerate 40 mg rosuvastatin.  In follow-up, if triglycerides remain elevated, consider addition of Vascepa.  DM2: A1c 7.2.  Ongoing management per PCP.  CVA/TIA: No residual deficits.  She remains on apixaban, clopidogrel, and rosuvastatin.  Schedule echo with agitated saline contrast.  Follow-up with neurology.   Disposition: F/u with Dr. Kirke Corin or an APP in after echo with agitated saline.   Medication Adjustments/Labs and Tests Ordered: Current medicines are reviewed at length with the patient today.  Concerns regarding medicines are outlined above. Medication changes, Labs and Tests ordered today are summarized above and listed in the Patient Instructions accessible in Encounters.   Signed, Eula Listen, PA-C 04/12/2023 9:36 AM      [facility] [place] [address] [place], [place] [address] [phone]

## 2023-04-12 ENCOUNTER — Ambulatory Visit: Payer: Medicare PPO | Attending: Physician Assistant | Admitting: Physician Assistant

## 2023-04-12 ENCOUNTER — Encounter: Payer: Self-pay | Admitting: Physician Assistant

## 2023-04-12 VITALS — BP 138/80 | HR 61 | Ht <= 58 in | Wt 134.0 lb

## 2023-04-12 DIAGNOSIS — I639 Cerebral infarction, unspecified: Secondary | ICD-10-CM | POA: Diagnosis not present

## 2023-04-12 DIAGNOSIS — I25118 Atherosclerotic heart disease of native coronary artery with other forms of angina pectoris: Secondary | ICD-10-CM | POA: Diagnosis not present

## 2023-04-12 DIAGNOSIS — I2111 ST elevation (STEMI) myocardial infarction involving right coronary artery: Secondary | ICD-10-CM

## 2023-04-12 DIAGNOSIS — G459 Transient cerebral ischemic attack, unspecified: Secondary | ICD-10-CM | POA: Diagnosis not present

## 2023-04-12 DIAGNOSIS — E785 Hyperlipidemia, unspecified: Secondary | ICD-10-CM

## 2023-04-12 DIAGNOSIS — I480 Paroxysmal atrial fibrillation: Secondary | ICD-10-CM

## 2023-04-12 MED ORDER — ISOSORBIDE MONONITRATE ER 30 MG PO TB24: 30.0000 mg | ORAL_TABLET | Freq: Every day | ORAL | 3 refills | Status: DC

## 2023-04-12 NOTE — Patient Instructions (Addendum)
Medication Instructions:  Please start taking Imdur 30 mg daily.  *If you need a refill on your cardiac medications before your next appointment, please call your pharmacy*   Lab Work: No lab work ordered today.    Testing/Procedures: Your physician has requested that you have an echocardiogram. Echocardiography is a painless test that uses sound waves to create images of your heart. It provides your doctor with information about the size and shape of your heart and how well your heart's chambers and valves are working.   You may receive an ultrasound enhancing agent through an IV if needed to better visualize your heart during the echo. This procedure takes approximately one hour.  There are no restrictions for this procedure.  This will take place at [address] [facility] [address] [address], [place] [address]    Follow-Up: At [facility], you and your health needs are our priority.  As part of our continuing mission to provide you with exceptional heart care, we have created designated Provider Care Teams.  These Care Teams include your primary Cardiologist (physician) and Advanced Practice Providers (APPs -  Physician Assistants and Nurse Practitioners) who all work together to provide you with the care you need, when you need it.   Your next appointment:    Follow up after Echo  Provider:   Lorine Bears, MD or Eula Listen, PA-C

## 2023-05-09 ENCOUNTER — Other Ambulatory Visit: Payer: Medicare PPO

## 2023-05-10 ENCOUNTER — Ambulatory Visit: Payer: Medicare PPO | Admitting: Physician Assistant

## 2023-05-23 ENCOUNTER — Ambulatory Visit: Payer: Medicare PPO | Attending: Physician Assistant

## 2023-05-23 DIAGNOSIS — I639 Cerebral infarction, unspecified: Secondary | ICD-10-CM | POA: Diagnosis not present

## 2023-05-23 DIAGNOSIS — G459 Transient cerebral ischemic attack, unspecified: Secondary | ICD-10-CM | POA: Diagnosis not present

## 2023-05-23 LAB — ECHOCARDIOGRAM COMPLETE BUBBLE STUDY
AR max vel: 2.03 cm2
AV Area VTI: 2.17 cm2
AV Area mean vel: 2.05 cm2
AV Mean grad: 3 mmHg
AV Peak grad: 6.1 mmHg
Ao pk vel: 1.23 m/s
Area-P 1/2: 2.42 cm2
S' Lateral: 2.6 cm

## 2023-05-23 MED ORDER — PERFLUTREN LIPID MICROSPHERE
1.0000 mL | INTRAVENOUS | Status: AC | PRN
Start: 2023-05-23 — End: 2023-05-23
  Administered 2023-05-23: 3 mL via INTRAVENOUS

## 2023-05-31 ENCOUNTER — Telehealth: Payer: Self-pay | Admitting: Cardiovascular Disease

## 2023-05-31 ENCOUNTER — Other Ambulatory Visit: Payer: Self-pay | Admitting: Physician Assistant

## 2023-05-31 DIAGNOSIS — I480 Paroxysmal atrial fibrillation: Secondary | ICD-10-CM

## 2023-05-31 MED ORDER — APIXABAN 5 MG PO TABS
5.0000 mg | ORAL_TABLET | Freq: Two times a day (BID) | ORAL | 5 refills | Status: DC
Start: 2023-05-31 — End: 2023-12-03

## 2023-05-31 NOTE — Telephone Encounter (Signed)
Refill sent.

## 2023-05-31 NOTE — Telephone Encounter (Signed)
Prescription refill request for Eliquis received. Indication: Afib  Last office visit: 04/12/23 ([place])  Scr: 1.02 (04/02/23)  Age: 82 Weight: 60.8kg  Appropriate dose. Refill sent.

## 2023-05-31 NOTE — Telephone Encounter (Signed)
*  STAT* If patient is at the pharmacy, call can be transferred to refill team.   1. Which medications need to be refilled? (please list name of each medication and dose if known)   apixaban (ELIQUIS) 5 MG TABS tablet (Expired)   2. Which pharmacy/location (including street and city if local pharmacy) is medication to be sent to?  [facility] - [place], [place] - [address]   3. Do they need a 30 day or 90 day supply?  30 day  Patient stated she is completely out of this medication.

## 2023-06-07 NOTE — Progress Notes (Unsigned)
Cardiology Office Note    Date:  06/11/2023   ID:  Magdala, Brahmbhatt 1941-01-29, MRN [id number]  PCP:  Filbert Berthold, MD  Cardiologist:  Lorine Bears, MD  Electrophysiologist:  None   Chief Complaint: Follow-up  History of Present Illness:   Vanessa Young is a 82 y.o. female with history of CAD with inferior STEMI in 11/2022 s/p PCI/DES to the RCA, PAF on apixaban, CVA/TIA, DM2, HTN, and HLD who presents for follow up of CAD and Afib.   She was previously followed by Drs. Prudence Davidson, with [facility], for A-fib with transition of her care to our office during admission in 11/2022.   Prior to her admission in 11/2022, she had no known history of ischemic heart disease.  She was seen in the ED on 11/18/2022 with generalized weakness and headache and felt to be volume depleted with symptomatic improvement following IV fluids.  Following discharge, she began to have substernal chest pain described as a burning sensation across the whole chest radiating into the bilateral arms.  She slept and felt better, though was woken up with substernal chest tightness and heartburn.  EMS was called with EKG in the field showing evidence of an inferior ST elevation MI leading to activation of code STEMI.  Upon cardiology evaluation, she was still having angina.  She reported that she did not take Xarelto daily, instead took this 3 times weekly.  Emergent LHC showed severe three-vessel CAD with the culprit lesion being a subtotal occlusion of the mid RCA.  In addition, there was an occluded OM1 with collaterals and diffuse subocclusive disease throughout the mid and distal LAD.  Moderately reduced LV systolic function estimated at 35 to 45% with severely elevated LVEDP at 35 mmHg.  She underwent successful PCI/DES to the mid RCA.  It was felt the LAD was not optimal for PCI given long diffuse disease with medical therapy favored.  Post intervention echo during the admission showed an EF of 60 to 65%, mild  hypokinesis of the basal mid inferior wall and inferolateral wall, mild LVH, grade 1 diastolic dysfunction, normal RV systolic function and ventricular cavity size, and trivial aortic insufficiency.  High-sensitivity troponin peaked at 3612.  From a cardiac perspective, she was discharged on apixaban, clopidogrel, carvedilol, amlodipine, losartan, and rosuvastatin.   Following discharge, she was readmitted to the hospital with sudden onset of left-sided weakness with expressive aphasia and dysarthria which resolved prior to presentation.  MRI of the brain notable for multiple small embolic infarcts.  She was evaluated by neurology with recommendation to continue apixaban and clopidogrel as well as recently titrated dose of rosuvastatin.  Prilosec was changed to Protonix.     She was seen in the ED on 04/02/2023 with aphasia and headache that started several hours prior.  BP in the 190s to low 200s systolic in the ER.  CT head showed no acute intracranial abnormality.  Neurology was tele-consulted.  MRI of the brain showed no acute intracranial abnormality.  MRA of the head/neck showed no emergent large vessel occlusive disease or high-grade stenosis with mild narrowing of the right MCA M1 segment and findings consistent with chronic small vessel ischemia and volume loss.  Patient returned to baseline without further intervention.  No changes were made in outpatient medications.   She was last seen in the office in 03/2023 and reported some exertional chest heaviness that improved after episodes of belching, and predated her MI.  She was started on  Imdur 30 mg, however this had to be discontinued due to headache.  Given her TIA, she underwent an echo on 05/23/2023 demonstrated an EF of 55 to 60%, no regional wall motion abnormalities, mild LVH, grade 1 diastolic dysfunction, normal RV systolic function and ventricular cavity size, RVSP 33.5 mmHg, mild mitral regurgitation, mild to moderate tricuspid regurgitation,  mild aortic insufficiency, normal CVP, and negative bubble study.  She comes in doing well from a cardiac perspective and is without symptoms of angina or cardiac decompensation.  No dyspnea, palpitations, dizziness, presyncope, or syncope.  No falls, hematochezia, or melena.  Adherent and tolerating apixaban twice daily along with clopidogrel once daily, rosuvastatin, carvedilol, chlorthalidone, and losartan.  Remains very active at baseline, joining [employer] sneakers.  Blood pressure at home typically well-controlled.  She does not have any acute cardiac concerns at this time.   Labs independently reviewed: 03/2023 - TC 111, TG 226, HDL 34, LDL 32, potassium 3.5, BUN 26, serum creatinine 1.02, albumin 3.8, AST/ALT normal, Hgb 12.3, PLT 225 11/2022 - magnesium 2.0, LP(a) 17.6, A1c 7.2   Past Medical History:  Diagnosis Date   A-fib (HCC)    Depression    Diabetes mellitus without complication (HCC)    GERD (gastroesophageal reflux disease)    Hypercholesteremia    Hypertension     Past Surgical History:  Procedure Laterality Date   CORONARY/GRAFT ACUTE MI REVASCULARIZATION N/A 11/19/2022   Procedure: Coronary/Graft Acute MI Revascularization;  Surgeon: Iran Ouch, MD;  Location: [facility] INVASIVE CV LAB;  Service: Cardiovascular;  Laterality: N/A;   CYSTOSCOPY     LEFT HEART CATH AND CORONARY ANGIOGRAPHY N/A 11/19/2022   Procedure: LEFT HEART CATH AND CORONARY ANGIOGRAPHY;  Surgeon: Iran Ouch, MD;  Location: [facility] INVASIVE CV LAB;  Service: Cardiovascular;  Laterality: N/A;    Current Medications: Current Meds  Medication Sig   ACCU-CHEK GUIDE test strip    ALPRAZolam (XANAX) 0.25 MG tablet Take 0.25 mg by mouth at bedtime as needed for sleep.   apixaban (ELIQUIS) 5 MG TABS tablet Take 1 tablet (5 mg total) by mouth 2 (two) times daily.   carvedilol (COREG) 12.5 MG tablet Take 1 tablet (12.5 mg total) by mouth 2 (two) times daily.   chlorthalidone (HYGROTON) 25 MG tablet Take  0.5 tablets (12.5 mg total) by mouth daily.   clopidogrel (PLAVIX) 75 MG tablet Take 75 mg by mouth daily.   escitalopram (LEXAPRO) 10 MG tablet Take 10 mg by mouth daily.   losartan (COZAAR) 100 MG tablet Take 1 tablet (100 mg total) by mouth daily.   magnesium oxide (MAG-OX) 400 (240 Mg) MG tablet Take 1 tablet by mouth daily.   metFORMIN (GLUMETZA) 500 MG (MOD) 24 hr tablet Take 500 mg by mouth 2 (two) times daily with a meal.   pantoprazole (PROTONIX) 40 MG tablet Take 1 tablet (40 mg total) by mouth daily.   rosuvastatin (CRESTOR) 20 MG tablet Take 1 tablet (20 mg total) by mouth daily.    Allergies:   Keflex [cephalexin], Hydrochlorothiazide w-triamterene, Lisinopril, Penicillins, and Statins   Social History   Socioeconomic History   Marital status: Widowed    Spouse name: Not on file   Number of children: Not on file   Years of education: Not on file   Highest education level: Not on file  Occupational History   Not on file  Tobacco Use   Smoking status: Former    Packs/day: 0.25    Years: 20.00  Additional pack years: 0.00    Total pack years: 5.00    Types: Cigarettes    Quit date: 2002    Years since quitting: 22.4   Smokeless tobacco: Never  Vaping Use   Vaping Use: Never used  Substance and Sexual Activity   Alcohol use: No   Drug use: No   Sexual activity: Not on file  Other Topics Concern   Not on file  Social History Narrative   Not on file   Social Determinants of Health   Financial Resource Strain: Not on file  Food Insecurity: No Food Insecurity (11/19/2022)   Hunger Vital Sign    Worried About Running Out of Food in the Last Year: Never true    Ran Out of Food in the Last Year: Never true  Transportation Needs: No Transportation Needs (11/19/2022)   PRAPARE - Administrator, Civil Service (Medical): No    Lack of Transportation (Non-Medical): No  Physical Activity: Not on file  Stress: Not on file  Social Connections: Not on file      Family History:  The patient's family history includes CVA in her father and mother; Diabetes in her mother.  ROS:   12-point review of systems is negative unless otherwise noted in the HPI.   EKGs/Labs/Other Studies Reviewed:    Studies reviewed were summarized above. The additional studies were reviewed today:  2D echo 11/10/2018: - Left ventricle: Wall thickness was increased in a pattern of mild    LVH. Systolic function was normal. The estimated ejection    fraction was in the range of 55% to 60%. Doppler parameters are    consistent with abnormal left ventricular relaxation (grade 1    diastolic dysfunction).  - Right ventricle: The cavity size was mildly dilated.  - Pulmonary arteries: PA peak pressure: 37 mm Hg (S).  __________   LHC 11/19/2022:   1st Mrg lesion is 100% stenosed.   Prox LAD to Mid LAD lesion is 40% stenosed.   1st Diag lesion is 60% stenosed.   Mid LAD to Dist LAD lesion is 99% stenosed.   RPDA lesion is 85% stenosed.   Prox RCA to Mid RCA lesion is 40% stenosed.   Mid RCA to Dist RCA lesion is 99% stenosed.   A drug-eluting stent was successfully placed using a STENT ONYX FRONTIER 3.5X30.   Post intervention, there is a 0% residual stenosis.   There is moderate left ventricular systolic dysfunction.   LV end diastolic pressure is severely elevated.   The left ventricular ejection fraction is 35-45% by visual estimate.   1.  Severe three-vessel coronary artery disease.  The culprit is a subtotal occlusion of the mid right coronary artery.  In addition, the patient has occluded OM1 with collaterals and diffuse subocclusive disease throughout the mid and distal LAD. 2.  Moderately reduced LV systolic function with severely elevated left ventricular end-diastolic pressure 35 mmHg. 3.  Successful angioplasty and drug-eluting stent placement to the mid right coronary artery. 4.  Door to device was delayed by difficult access via the right radial artery  given calcified, tortuous and stenosed proximal radial artery.  This was navigated successfully with a run-through wire using balloon assisted tracking.   Recommendations: Will treat with aspirin and ticagrelor for now. The patient is chronically anticoagulated for atrial fibrillation.  Anticoagulation can likely be resumed tomorrow if no bleeding issues but should consider switching Xarelto to Eliquis and stopping aspirin to minimize the risk of  bleeding considering her age. I do not think the LAD is optimal for PCI given long diffuse disease.  Favor medical therapy. __________   2D echo 11/20/2022: 1. Left ventricular ejection fraction, by estimation, is 60 to 65%. The  left ventricle has normal function. The left ventricle demonstrates  regional wall motion abnormalities (see scoring diagram/findings for  description). There is mild left ventricular   hypertrophy. Left ventricular diastolic parameters are consistent with  Grade I diastolic dysfunction (impaired relaxation). Elevated left atrial  pressure. There is mild hypokinesis of the left ventricular, basal-mid  inferior wall and inferolateral wall.   2. Right ventricular systolic function is normal. The right ventricular  size is normal. Tricuspid regurgitation signal is inadequate for assessing  PA pressure.   3. The mitral valve is normal in structure. No evidence of mitral valve  regurgitation. No evidence of mitral stenosis.   4. The aortic valve is tricuspid. Aortic valve regurgitation is trivial.  No aortic stenosis is present. __________  2D echo 05/23/2023: 1. Left ventricular ejection fraction, by estimation, is 55 to 60%. Left  ventricular ejection fraction by PLAX is 63 %. The left ventricle has  normal function. The left ventricle has no regional wall motion  abnormalities. There is mild left ventricular  hypertrophy. Left ventricular diastolic parameters are consistent with  Grade I diastolic dysfunction (impaired  relaxation). The average left  ventricular global longitudinal strain is -16.4 %.   2. Right ventricular systolic function is normal. The right ventricular  size is normal. There is normal pulmonary artery systolic pressure. The  estimated right ventricular systolic pressure is 33.5 mmHg.   3. The mitral valve is normal in structure. Mild mitral valve  regurgitation. No evidence of mitral stenosis.   4. Tricuspid valve regurgitation is mild to moderate.   5. The aortic valve is tricuspid. Aortic valve regurgitation is mild. No  aortic stenosis is present.   6. The inferior vena cava is normal in size with greater than 50%  respiratory variability, suggesting right atrial pressure of 3 mmHg.   7. Agitated saline contrast bubble study was negative, with no evidence  of any interatrial shunt.   Comparison(s): EF 60%, mild LVH, mild hypokinesis basal-mid inferior wall,  and inferolateral wall.    EKG:  EKG is ordered today.  The EKG ordered today demonstrates NSR, 60 bpm, poor R wave progression in the precordial leads, significant CT changes  Recent Labs: 11/22/2022: Magnesium 2.0 04/02/2023: ALT 11; BUN 26; Creatinine, Ser 1.02; Hemoglobin 12.3; Platelets 225; Potassium 3.5; Sodium 139  Recent Lipid Panel    Component Value Date/Time   CHOL 111 04/03/2023 0513   TRIG 226 (H) 04/03/2023 0513   HDL 34 (L) 04/03/2023 0513   CHOLHDL 3.3 04/03/2023 0513   VLDL 45 (H) 04/03/2023 0513   LDLCALC 32 04/03/2023 0513   LDLDIRECT 154 (H) 11/19/2022 1446    PHYSICAL EXAM:    VS:  BP (!) 146/90 (BP Location: Left Arm, Patient Position: Sitting, Cuff Size: Normal)   Pulse 60   Ht 4\' 9"  (1.448 m)   Wt 132 lb 3.2 oz (60 kg)   SpO2 98%   BMI 28.61 kg/m   BMI: Body mass index is 28.61 kg/m.  Physical Exam Vitals reviewed.  Constitutional:      Appearance: She is well-developed.  HENT:     Head: Normocephalic and atraumatic.  Eyes:     General:        Right eye: No discharge.  Left eye: No discharge.  Neck:     Vascular: No JVD.  Cardiovascular:     Rate and Rhythm: Normal rate and regular rhythm.     Heart sounds: Normal heart sounds, S1 normal and S2 normal. Heart sounds not distant. No midsystolic click and no opening snap. No murmur heard.    No friction rub.  Pulmonary:     Effort: Pulmonary effort is normal. No respiratory distress.     Breath sounds: Normal breath sounds. No decreased breath sounds, wheezing or rales.  Chest:     Chest wall: No tenderness.  Abdominal:     General: There is no distension.     Palpations: Abdomen is soft.     Tenderness: There is no abdominal tenderness.  Musculoskeletal:     Cervical back: Normal range of motion.     Right lower leg: No edema.     Left lower leg: No edema.  Skin:    General: Skin is warm and dry.     Nails: There is no clubbing.  Neurological:     Mental Status: She is alert and oriented to person, place, and time.  Psychiatric:        Speech: Speech normal.        Behavior: Behavior normal.        Thought Content: Thought content normal.        Judgment: Judgment normal.     Wt Readings from Last 3 Encounters:  06/11/23 132 lb 3.2 oz (60 kg)  04/12/23 134 lb (60.8 kg)  04/02/23 136 lb 11 oz (62 kg)     ASSESSMENT & PLAN:   CAD involving the native coronary arteries with recent inferior STEMI without angina: She is doing very well and without symptoms of angina or cardiac decompensation.  Continue aggressive risk factor modification and secondary prevention including apixaban, given A-fib, and clopidogrel without interruption for a minimum of 12 months dating back to date of PCI (11/19/2022) along with continuation of carvedilol, losartan, and rosuvastatin.  She has graduated from cardiac rehab and will be enrolling in [employer] sne[employer].  No indication for further ischemic testing at this time as residual LAD stenosis is not optimal for PCI with recommendation for medical therapy.  PAF:  Maintaining sinus rhythm with carvedilol 12.5 mg twice daily.  CHA2DS2-VASc at least 8.  She remains on apixaban 5 mg twice daily and does not meet reduced dosing criteria.  No symptoms concerning for bleeding.  Recent labs showed stable hemoglobin, renal function, and electrolytes.  HTN: Blood pressure is mildly elevated in the office today though well-controlled at home.  She remains on carvedilol, chlorthalidone, and losartan.  HLD: LDL 44 with a triglyceride of 226 with normal AST/ALT in 01/2023.  He is on rosuvastatin 20 mg, did not tolerate 40 mg.  If follow-up labs demonstrate continued elevated triglycerides, consider addition of Vascepa.  Heart healthy diet recommended.  CVA/TIA: No residual deficits.  She remains on apixaban, clopidogrel, and rosuvastatin.  Echo with negative bubble study.  Continue to follow-up with neurology.  DM2: A1c 7.2.  Management per PCP.    Disposition: F/u with Dr. Kirke Corin or an APP in 6 months.   Medication Adjustments/Labs and Tests Ordered: Current medicines are reviewed at length with the patient today.  Concerns regarding medicines are outlined above. Medication changes, Labs and Tests ordered today are summarized above and listed in the Patient Instructions accessible in Encounters.   SignedEula Listen, PA-C 06/11/2023 12:02 PM  [facility] [address] [place],  [address] [phone]

## 2023-06-11 ENCOUNTER — Ambulatory Visit: Payer: Medicare PPO | Attending: Physician Assistant | Admitting: Physician Assistant

## 2023-06-11 ENCOUNTER — Encounter: Payer: Self-pay | Admitting: Physician Assistant

## 2023-06-11 VITALS — BP 146/90 | HR 60 | Ht <= 58 in | Wt 132.2 lb

## 2023-06-11 DIAGNOSIS — I480 Paroxysmal atrial fibrillation: Secondary | ICD-10-CM

## 2023-06-11 DIAGNOSIS — E785 Hyperlipidemia, unspecified: Secondary | ICD-10-CM

## 2023-06-11 DIAGNOSIS — I10 Essential (primary) hypertension: Secondary | ICD-10-CM | POA: Diagnosis not present

## 2023-06-11 DIAGNOSIS — I25118 Atherosclerotic heart disease of native coronary artery with other forms of angina pectoris: Secondary | ICD-10-CM | POA: Diagnosis not present

## 2023-06-11 DIAGNOSIS — Z8673 Personal history of transient ischemic attack (TIA), and cerebral infarction without residual deficits: Secondary | ICD-10-CM

## 2023-06-11 DIAGNOSIS — I2111 ST elevation (STEMI) myocardial infarction involving right coronary artery: Secondary | ICD-10-CM

## 2023-06-11 DIAGNOSIS — G459 Transient cerebral ischemic attack, unspecified: Secondary | ICD-10-CM

## 2023-06-11 NOTE — Patient Instructions (Signed)
Medication Instructions:  No changes at this time.   *If you need a refill on your cardiac medications before your next appointment, please call your pharmacy*   Lab Work: None If you have labs (blood work) drawn today and your tests are completely normal, you will receive your results only by: MyChart Message (if you have MyChart) OR A paper copy in the mail If you have any lab test that is abnormal or we need to change your treatment, we will call you to review the results.   Testing/Procedures: None   Follow-Up: At [facility], you and your health needs are our priority.  As part of our continuing mission to provide you with exceptional heart care, we have created designated Provider Care Teams.  These Care Teams include your primary Cardiologist (physician) and Advanced Practice Providers (APPs -  Physician Assistants and Nurse Practitioners) who all work together to provide you with the care you need, when you need it.   Your next appointment:   6 month(s)  Provider:   Muhammad Arida, MD or Ryan Dunn, PA-C     

## 2023-06-24 ENCOUNTER — Other Ambulatory Visit: Payer: Self-pay | Admitting: Cardiovascular Disease

## 2023-08-08 ENCOUNTER — Other Ambulatory Visit: Payer: Self-pay | Admitting: Physician Assistant

## 2023-09-09 NOTE — Progress Notes (Unsigned)
Cardiology Office Note    Date:  09/10/2023   ID:  Vanessa Young, DOB Apr 16, 1941, MRN [id number]  PCP:  Filbert Berthold, MD  Cardiologist:  Lorine Bears, MD  Electrophysiologist:  None   Chief Complaint: Elevated BP readings  History of Present Illness:   Vanessa Young is a 82 y.o. female with history of CAD with inferior STEMI in 11/2022 s/p PCI/DES to the RCA, PAF on apixaban, CVA/TIA, DM2, HTN, and HLD who presents for evaluation of hypertension.   She was previously followed by Drs. Prudence Davidson, with [facility], for A-fib with transition of her care to our office during admission in 11/2022.   Prior to her admission in 11/2022, she had no known history of ischemic heart disease.  She was seen in the ED on 11/18/2022 with generalized weakness and headache and felt to be volume depleted with symptomatic improvement following IV fluids.  Following discharge, she began to have substernal chest pain described as a burning sensation across the whole chest radiating into the bilateral arms.  She slept and felt better, though was woken up with substernal chest tightness and heartburn.  EMS was called with EKG in the field showing evidence of an inferior ST elevation MI leading to activation of code STEMI.  Upon cardiology evaluation, she was still having angina.  She reported that she did not take Xarelto daily, instead took this 3 times weekly.  Emergent LHC showed severe three-vessel CAD with the culprit lesion being a subtotal occlusion of the mid RCA.  In addition, there was an occluded OM1 with collaterals and diffuse subocclusive disease throughout the mid and distal LAD.  Moderately reduced LV systolic function estimated at 35 to 45% with severely elevated LVEDP at 35 mmHg.  She underwent successful PCI/DES to the mid RCA.  It was felt the LAD was not optimal for PCI given long diffuse disease with medical therapy favored.  Post intervention echo during the admission showed an EF of 60 to  65%, mild hypokinesis of the basal mid inferior wall and inferolateral wall, mild LVH, grade 1 diastolic dysfunction, normal RV systolic function and ventricular cavity size, and trivial aortic insufficiency.  High-sensitivity troponin peaked at 3612.  From a cardiac perspective, she was discharged on apixaban, clopidogrel, carvedilol, amlodipine, losartan, and rosuvastatin.   Following discharge, she was readmitted to the hospital with sudden onset of left-sided weakness with expressive aphasia and dysarthria which resolved prior to presentation.  MRI of the brain notable for multiple small embolic infarcts.  She was evaluated by neurology with recommendation to continue apixaban and clopidogrel as well as recently titrated dose of rosuvastatin.  Prilosec was changed to Protonix.     She was seen in the ED on 04/02/2023 with aphasia and headache that started several hours prior.  BP in the 190s to low 200s systolic in the ER.  CT head showed no acute intracranial abnormality.  Neurology was tele-consulted.  MRI of the brain showed no acute intracranial abnormality.  MRA of the head/neck showed no emergent large vessel occlusive disease or high-grade stenosis with mild narrowing of the right MCA M1 segment and findings consistent with chronic small vessel ischemia and volume loss.  Patient returned to baseline without further intervention.  No changes were made in outpatient medications.    She was seen in the office in 03/2023 and reported some exertional chest heaviness that improved after episodes of belching, and predated her MI.  She was started on Imdur  30 mg, however this had to be discontinued due to headache.  Given her TIA, she underwent an echo on 05/23/2023 demonstrated an EF of 55 to 60%, no regional wall motion abnormalities, mild LVH, grade 1 diastolic dysfunction, normal RV systolic function and ventricular cavity size, RVSP 33.5 mmHg, mild mitral regurgitation, mild to moderate tricuspid  regurgitation, mild aortic insufficiency, normal CVP, and negative bubble study.  She was last seen in the office in 05/2023 and was without symptoms of angina or cardiac decompensation.  She remained active at baseline.  Since she was last seen, she has reported elevations in BP leading amlodipine to be initiated, though with this she noted an increase in fatigue.  She comes in today for discussion of elevated BP readings.  Off amlodipine, BP log shows readings ranging predominantly in the 130s systolic with an occasional 150s systolic and a rare 119 systolic.  Fatigue improved off amlodipine.  No symptoms of angina or cardiac decompensation.  No palpitations, dizziness, presyncope, or syncope.  Falls or symptoms concerning for bleeding.   Labs independently reviewed: 08/2023 - potassium 3.9, BUN 20, serum creatinine 1.5, A1c 8.0 03/2023 - TC 111, TG 226, HDL 34, LDL 32, albumin 3.8, AST/ALT normal, Hgb 12.3, PLT 225 11/2022 - magnesium 2.0, LP(a) 17.6  Past Medical History:  Diagnosis Date   A-fib (HCC)    Depression    Diabetes mellitus without complication (HCC)    GERD (gastroesophageal reflux disease)    Hypercholesteremia    Hypertension     Past Surgical History:  Procedure Laterality Date   CORONARY/GRAFT ACUTE MI REVASCULARIZATION N/A 11/19/2022   Procedure: Coronary/Graft Acute MI Revascularization;  Surgeon: Iran Ouch, MD;  Location: [facility] INVASIVE CV LAB;  Service: Cardiovascular;  Laterality: N/A;   CYSTOSCOPY     LEFT HEART CATH AND CORONARY ANGIOGRAPHY N/A 11/19/2022   Procedure: LEFT HEART CATH AND CORONARY ANGIOGRAPHY;  Surgeon: Iran Ouch, MD;  Location: [facility] INVASIVE CV LAB;  Service: Cardiovascular;  Laterality: N/A;    Current Medications: Current Meds  Medication Sig   ACCU-CHEK GUIDE test strip    ALPRAZolam (XANAX) 0.25 MG tablet Take 0.25 mg by mouth at bedtime as needed for sleep.   apixaban (ELIQUIS) 5 MG TABS tablet Take 1 tablet (5 mg total)  by mouth 2 (two) times daily.   carvedilol (COREG) 12.5 MG tablet Take 1 tablet (12.5 mg total) by mouth 2 (two) times daily.   chlorthalidone (HYGROTON) 25 MG tablet Take 0.5 tablets (12.5 mg total) by mouth daily.   clopidogrel (PLAVIX) 75 MG tablet Take 1 tablet (75 mg total) by mouth daily.   escitalopram (LEXAPRO) 5 MG tablet Take 5 mg by mouth daily.   losartan (COZAAR) 100 MG tablet Take 1 tablet (100 mg total) by mouth daily.   magnesium oxide (MAG-OX) 400 (240 Mg) MG tablet Take 1 tablet by mouth daily.   pantoprazole (PROTONIX) 40 MG tablet Take 1 tablet (40 mg total) by mouth daily.   rosuvastatin (CRESTOR) 20 MG tablet Take 1 tablet (20 mg total) by mouth daily.   valsartan (DIOVAN) 160 MG tablet Take 1 tablet (160 mg total) by mouth daily.    Allergies:   Keflex [cephalexin], Hydrochlorothiazide w-triamterene, Lisinopril, Penicillins, and Statins   Social History   Socioeconomic History   Marital status: Widowed    Spouse name: Not on file   Number of children: Not on file   Years of education: Not on file   Highest education level:  Not on file  Occupational History   Not on file  Tobacco Use   Smoking status: Former    Current packs/day: 0.00    Average packs/day: 0.3 packs/day for 20.0 years (5.0 ttl pk-yrs)    Types: Cigarettes    Start date: 10    Quit date: 2002    Years since quitting: 22.7   Smokeless tobacco: Never  Vaping Use   Vaping status: Never Used  Substance and Sexual Activity   Alcohol use: No   Drug use: No   Sexual activity: Not on file  Other Topics Concern   Not on file  Social History Narrative   Not on file   Social Determinants of Health   Financial Resource Strain: Patient Declined (03/05/2023)   Received from [facility], [facility]   Overall Financial Resource Strain (CARDIA)    Difficulty of Paying Living Expenses: Patient declined  Food Insecurity: Patient Declined (03/05/2023)   Received  from [facility], [facility]   Hunger Vital Sign    Worried About Running Out of Food in the Last Year: Patient declined    Ran Out of Food in the Last Year: Patient declined  Transportation Needs: Patient Declined (03/05/2023)   Received from [facility], [facility]   PRAPARE - Transportation    In the past 12 months, has lack of transportation kept you from medical appointments or from getting medications?: Patient declined    Lack of Transportation (Non-Medical): Patient declined  Physical Activity: Not on file  Stress: Not on file  Social Connections: Not on file     Family History:  The patient's family history includes CVA in her father and mother; Diabetes in her mother.  ROS:   12-point review of systems is negative unless otherwise noted in the HPI.   EKGs/Labs/Other Studies Reviewed:    Studies reviewed were summarized above. The additional studies were reviewed today:  2D echo 11/10/2018: - Left ventricle: Wall thickness was increased in a pattern of mild    LVH. Systolic function was normal. The estimated ejection    fraction was in the range of 55% to 60%. Doppler parameters are    consistent with abnormal left ventricular relaxation (grade 1    diastolic dysfunction).  - Right ventricle: The cavity size was mildly dilated.  - Pulmonary arteries: PA peak pressure: 37 mm Hg (S).  __________   LHC 11/19/2022:   1st Mrg lesion is 100% stenosed.   Prox LAD to Mid LAD lesion is 40% stenosed.   1st Diag lesion is 60% stenosed.   Mid LAD to Dist LAD lesion is 99% stenosed.   RPDA lesion is 85% stenosed.   Prox RCA to Mid RCA lesion is 40% stenosed.   Mid RCA to Dist RCA lesion is 99% stenosed.   A drug-eluting stent was successfully placed using a STENT ONYX FRONTIER 3.5X30.   Post intervention, there is a 0% residual stenosis.   There is moderate left ventricular systolic dysfunction.   LV end  diastolic pressure is severely elevated.   The left ventricular ejection fraction is 35-45% by visual estimate.   1.  Severe three-vessel coronary artery disease.  The culprit is a subtotal occlusion of the mid right coronary artery.  In addition, the patient has occluded OM1 with collaterals and diffuse subocclusive disease throughout the mid and distal LAD. 2.  Moderately reduced LV systolic function with severely elevated left  ventricular end-diastolic pressure 35 mmHg. 3.  Successful angioplasty and drug-eluting stent placement to the mid right coronary artery. 4.  Door to device was delayed by difficult access via the right radial artery given calcified, tortuous and stenosed proximal radial artery.  This was navigated successfully with a run-through wire using balloon assisted tracking.   Recommendations: Will treat with aspirin and ticagrelor for now. The patient is chronically anticoagulated for atrial fibrillation.  Anticoagulation can likely be resumed tomorrow if no bleeding issues but should consider switching Xarelto to Eliquis and stopping aspirin to minimize the risk of bleeding considering her age. I do not think the LAD is optimal for PCI given long diffuse disease.  Favor medical therapy. __________   2D echo 11/20/2022: 1. Left ventricular ejection fraction, by estimation, is 60 to 65%. The  left ventricle has normal function. The left ventricle demonstrates  regional wall motion abnormalities (see scoring diagram/findings for  description). There is mild left ventricular   hypertrophy. Left ventricular diastolic parameters are consistent with  Grade I diastolic dysfunction (impaired relaxation). Elevated left atrial  pressure. There is mild hypokinesis of the left ventricular, basal-mid  inferior wall and inferolateral wall.   2. Right ventricular systolic function is normal. The right ventricular  size is normal. Tricuspid regurgitation signal is inadequate for assessing   PA pressure.   3. The mitral valve is normal in structure. No evidence of mitral valve  regurgitation. No evidence of mitral stenosis.   4. The aortic valve is tricuspid. Aortic valve regurgitation is trivial.  No aortic stenosis is present. __________   2D echo 05/23/2023: 1. Left ventricular ejection fraction, by estimation, is 55 to 60%. Left  ventricular ejection fraction by PLAX is 63 %. The left ventricle has  normal function. The left ventricle has no regional wall motion  abnormalities. There is mild left ventricular  hypertrophy. Left ventricular diastolic parameters are consistent with  Grade I diastolic dysfunction (impaired relaxation). The average left  ventricular global longitudinal strain is -16.4 %.   2. Right ventricular systolic function is normal. The right ventricular  size is normal. There is normal pulmonary artery systolic pressure. The  estimated right ventricular systolic pressure is 33.5 mmHg.   3. The mitral valve is normal in structure. Mild mitral valve  regurgitation. No evidence of mitral stenosis.   4. Tricuspid valve regurgitation is mild to moderate.   5. The aortic valve is tricuspid. Aortic valve regurgitation is mild. No  aortic stenosis is present.   6. The inferior vena cava is normal in size with greater than 50%  respiratory variability, suggesting right atrial pressure of 3 mmHg.   7. Agitated saline contrast bubble study was negative, with no evidence  of any interatrial shunt.   Comparison(s): EF 60%, mild LVH, mild hypokinesis basal-mid inferior wall,  and inferolateral wall.    EKG:  EKG is not ordered today.   Recent Labs: 11/22/2022: Magnesium 2.0 04/02/2023: ALT 11; BUN 26; Creatinine, Ser 1.02; Hemoglobin 12.3; Platelets 225; Potassium 3.5; Sodium 139  Recent Lipid Panel    Component Value Date/Time   CHOL 111 04/03/2023 0513   TRIG 226 (H) 04/03/2023 0513   HDL 34 (L) 04/03/2023 0513   CHOLHDL 3.3 04/03/2023 0513   VLDL 45  (H) 04/03/2023 0513   LDLCALC 32 04/03/2023 0513   LDLDIRECT 154 (H) 11/19/2022 1446    PHYSICAL EXAM:    VS:  BP (!) 142/77 (BP Location: Left Arm, Patient Position: Sitting, Cuff Size:  Normal)   Pulse 70   Ht 4\' 9"  (1.448 m)   Wt 133 lb 3.2 oz (60.4 kg)   SpO2 98%   BMI 28.82 kg/m   BMI: Body mass index is 28.82 kg/m.  Physical Exam Vitals reviewed.  Constitutional:      Appearance: She is well-developed.  HENT:     Head: Normocephalic and atraumatic.  Eyes:     General:        Right eye: No discharge.        Left eye: No discharge.  Cardiovascular:     Rate and Rhythm: Normal rate and regular rhythm.     Heart sounds: Normal heart sounds, S1 normal and S2 normal. Heart sounds not distant. No midsystolic click and no opening snap. No murmur heard.    No friction rub.  Pulmonary:     Effort: Pulmonary effort is normal. No respiratory distress.     Breath sounds: Normal breath sounds. No decreased breath sounds, wheezing or rales.  Chest:     Chest wall: No tenderness.  Abdominal:     General: There is no distension.  Musculoskeletal:     Cervical back: Normal range of motion.     Right lower leg: No edema.     Left lower leg: No edema.  Skin:    General: Skin is warm and dry.     Nails: There is no clubbing.  Neurological:     Mental Status: She is alert and oriented to person, place, and time.  Psychiatric:        Speech: Speech normal.        Behavior: Behavior normal.        Thought Content: Thought content normal.        Judgment: Judgment normal.     Wt Readings from Last 3 Encounters:  09/10/23 133 lb 3.2 oz (60.4 kg)  06/11/23 132 lb 3.2 oz (60 kg)  04/12/23 134 lb (60.8 kg)     ASSESSMENT & PLAN:   CAD involving the native coronary arteries with recent inferior STEMI without angina: She is doing very well and without symptoms of angina or cardiac decompensation. Continue aggressive risk factor modification and secondary prevention including  apixaban, given A-fib, and clopidogrel without interruption for a minimum of 12 months dating back to date of PCI (11/19/2022) along with continuation of carvedilol, losartan, and rosuvastatin. She has graduated from cardiac rehab.  No indication for further ischemic testing at this time as residual LAD stenosis is not optimal for PCI with recommendation for medical therapy.   PAF: Maintaining sinus rhythm by physical exam on carvedilol 12.5 mg twice daily.  CHA2DS2-VASc at least 8.  She remains on apixaban 5 mg twice daily and does not meet reduced dosing criteria. No symptoms concerning for bleeding. Recent labs showed stable hemoglobin, renal function, and electrolytes.   HTN: Blood pressure mildly elevated at the office and at home with most readings in the 130s systolic.  Transition from losartan to higher intensity ARB, valsartan 160 mg daily with continuation of carvedilol 12.5 mg twice daily.  This will also allow for continued renal protection.  Low-sodium diet encouraged.  HLD: LDL 32 in 03/2023 with normal AST/ALT at that time.  She is on rosuvastatin 20 mg, did not tolerate 40 mg. If follow-up labs demonstrate continued elevated triglycerides, consider addition of Vascepa. Heart healthy diet recommended.   CVA/TIA:  No residual deficits. She remains on apixaban, clopidogrel, and rosuvastatin. Echo with negative bubble study.  Disposition: F/u with Dr. Kirke Corin or an APP as previously scheduled in 11/2023.   Medication Adjustments/Labs and Tests Ordered: Current medicines are reviewed at length with the patient today.  Concerns regarding medicines are outlined above. Medication changes, Labs and Tests ordered today are summarized above and listed in the Patient Instructions accessible in Encounters.   Signed, Eula Listen, PA-C 09/10/2023 12:45 PM     [facility] [address] [place], [place] [address] [phone]

## 2023-09-10 ENCOUNTER — Encounter: Payer: Self-pay | Admitting: Physician Assistant

## 2023-09-10 ENCOUNTER — Ambulatory Visit: Payer: Medicare PPO | Attending: Physician Assistant | Admitting: Physician Assistant

## 2023-09-10 VITALS — BP 142/77 | HR 70 | Ht <= 58 in | Wt 133.2 lb

## 2023-09-10 DIAGNOSIS — I25118 Atherosclerotic heart disease of native coronary artery with other forms of angina pectoris: Secondary | ICD-10-CM | POA: Diagnosis not present

## 2023-09-10 DIAGNOSIS — I480 Paroxysmal atrial fibrillation: Secondary | ICD-10-CM

## 2023-09-10 DIAGNOSIS — I2111 ST elevation (STEMI) myocardial infarction involving right coronary artery: Secondary | ICD-10-CM | POA: Diagnosis not present

## 2023-09-10 DIAGNOSIS — Z8673 Personal history of transient ischemic attack (TIA), and cerebral infarction without residual deficits: Secondary | ICD-10-CM

## 2023-09-10 DIAGNOSIS — I10 Essential (primary) hypertension: Secondary | ICD-10-CM | POA: Diagnosis not present

## 2023-09-10 DIAGNOSIS — G459 Transient cerebral ischemic attack, unspecified: Secondary | ICD-10-CM

## 2023-09-10 DIAGNOSIS — E785 Hyperlipidemia, unspecified: Secondary | ICD-10-CM

## 2023-09-10 MED ORDER — VALSARTAN 160 MG PO TABS: 160.0000 mg | ORAL_TABLET | Freq: Every day | ORAL | 3 refills | Status: DC

## 2023-09-10 NOTE — Patient Instructions (Signed)
Medication Instructions:  Your physician recommends the following medication changes.  STOP TAKING: Losartan  START TAKING: Valsartan 160 mg daily  *If you need a refill on your cardiac medications before your next appointment, please call your pharmacy*   Lab Work: None If you have labs (blood work) drawn today and your tests are completely normal, you will receive your results only by: MyChart Message (if you have MyChart) OR A paper copy in the mail If you have any lab test that is abnormal or we need to change your treatment, we will call you to review the results.   Follow-Up: At [facility], you and your health needs are our priority.  As part of our continuing mission to provide you with exceptional heart care, we have created designated Provider Care Teams.  These Care Teams include your primary Cardiologist (physician) and Advanced Practice Providers (APPs -  Physician Assistants and Nurse Practitioners) who all work together to provide you with the care you need, when you need it.  We recommend signing up for the patient portal called "MyChart".  Sign up information is provided on this After Visit Summary.  MyChart is used to connect with patients for Virtual Visits (Telemedicine).  Patients are able to view lab/test results, encounter notes, upcoming appointments, etc.  Non-urgent messages can be sent to your provider as well.   To learn more about what you can do with MyChart, go to [email or web address].    Your next appointment:   Previously scheduled in December  Provider:   You may see Lorine Bears, MD or one of the following Advanced Practice Providers on your designated Care Team:   Eula Listen, [place]

## 2023-11-04 ENCOUNTER — Other Ambulatory Visit: Payer: Self-pay | Admitting: Physician Assistant

## 2023-12-02 ENCOUNTER — Other Ambulatory Visit: Payer: Self-pay | Admitting: Cardiovascular Disease

## 2023-12-02 DIAGNOSIS — I480 Paroxysmal atrial fibrillation: Secondary | ICD-10-CM

## 2023-12-03 NOTE — Telephone Encounter (Signed)
Prescription refill request for Eliquis received. Indication:afib Last office visit:9/24 Scr:1.5  9/24 Age: 82 Weight:60.4  kg  Prescription refilled

## 2023-12-12 ENCOUNTER — Ambulatory Visit: Payer: Medicare PPO | Attending: Physician Assistant | Admitting: Physician Assistant

## 2023-12-12 ENCOUNTER — Encounter: Payer: Self-pay | Admitting: Physician Assistant

## 2023-12-12 VITALS — BP 130/72 | HR 72 | Ht <= 58 in | Wt 130.4 lb

## 2023-12-12 DIAGNOSIS — E785 Hyperlipidemia, unspecified: Secondary | ICD-10-CM

## 2023-12-12 DIAGNOSIS — I10 Essential (primary) hypertension: Secondary | ICD-10-CM

## 2023-12-12 DIAGNOSIS — I2510 Atherosclerotic heart disease of native coronary artery without angina pectoris: Secondary | ICD-10-CM

## 2023-12-12 DIAGNOSIS — Z79899 Other long term (current) drug therapy: Secondary | ICD-10-CM | POA: Diagnosis not present

## 2023-12-12 DIAGNOSIS — Z8673 Personal history of transient ischemic attack (TIA), and cerebral infarction without residual deficits: Secondary | ICD-10-CM

## 2023-12-12 DIAGNOSIS — I480 Paroxysmal atrial fibrillation: Secondary | ICD-10-CM

## 2023-12-12 NOTE — Progress Notes (Signed)
Cardiology Office Note    Date:  12/12/2023   ID:  PERSAEUS MAHADEVAN, DOB [birth year], MRN [id number]  PCP:  Filbert Berthold, MD  Cardiologist:  Lorine Bears, MD  Electrophysiologist:  None   Chief Complaint: Follow up   History of Present Illness:   Vanessa Young is a 82 y.o. female with history of CAD with inferior STEMI in 11/2022 s/p PCI/DES to the RCA, PAF on apixaban, CVA/TIA, DM2, HTN, and HLD who presents for follow-up of CAD and A-fib.   She was previously followed by Drs. Prudence Davidson, with [facility], for A-fib with transition of her care to our office during admission in 11/2022.   Prior to her admission in 11/2022, she had no known history of ischemic heart disease.  She was seen in the ED on 11/18/2022 with generalized weakness and headache and felt to be volume depleted with symptomatic improvement following IV fluids.  Following discharge, she began to have substernal chest pain described as a burning sensation across the whole chest radiating into the bilateral arms.  She slept and felt better, though was woken up with substernal chest tightness and heartburn.  EMS was called with EKG in the field showing evidence of an inferior ST elevation MI leading to activation of code STEMI.  Upon cardiology evaluation, she was still having angina.  She reported that she did not take Xarelto daily, instead took this 3 times weekly.  Emergent LHC showed severe three-vessel CAD with the culprit lesion being a subtotal occlusion of the mid RCA.  In addition, there was an occluded OM1 with collaterals and diffuse subocclusive disease throughout the mid and distal LAD.  Moderately reduced LV systolic function estimated at 35 to 45% with severely elevated LVEDP at 35 mmHg.  She underwent successful PCI/DES to the mid RCA.  It was felt the LAD was not optimal for PCI given long diffuse disease with medical therapy favored.  Post intervention echo during the admission showed an EF of 60 to 65%, mild  hypokinesis of the basal mid inferior wall and inferolateral wall, mild LVH, grade 1 diastolic dysfunction, normal RV systolic function and ventricular cavity size, and trivial aortic insufficiency.  High-sensitivity troponin peaked at 3612.  From a cardiac perspective, she was discharged on apixaban, clopidogrel, carvedilol, amlodipine, losartan, and rosuvastatin.   Following discharge, she was readmitted to the hospital with sudden onset of left-sided weakness with expressive aphasia and dysarthria which resolved prior to presentation.  MRI of the brain notable for multiple small embolic infarcts.  She was evaluated by neurology with recommendation to continue apixaban and clopidogrel as well as recently titrated dose of rosuvastatin.  Prilosec was changed to Protonix.     She was seen in the ED on 04/02/2023 with aphasia and headache that started several hours prior.  BP in the 190s to low 200s systolic in the ER.  CT head showed no acute intracranial abnormality.  Neurology was tele-consulted.  MRI of the brain showed no acute intracranial abnormality.  MRA of the head/neck showed no emergent large vessel occlusive disease or high-grade stenosis with mild narrowing of the right MCA M1 segment and findings consistent with chronic small vessel ischemia and volume loss.  Patient returned to baseline without further intervention.  No changes were made in outpatient medications.    She was seen in the office in 03/2023 and reported some exertional chest heaviness that improved after episodes of belching, and predated her MI.  She was started  on Imdur 30 mg, however this had to be discontinued due to headache.  Given her TIA, she underwent an echo on 05/23/2023 demonstrated an EF of 55 to 60%, no regional wall motion abnormalities, mild LVH, grade 1 diastolic dysfunction, normal RV systolic function and ventricular cavity size, RVSP 33.5 mmHg, mild mitral regurgitation, mild to moderate tricuspid regurgitation, mild  aortic insufficiency, normal CVP, and negative bubble study.   She was last seen in the office in 08/2023 for discussion of elevated BP readings.  She reported BPs predominantly in the 130s millimeter mercury systolic with an occasional 150s.  Previously noted fatigue was improved off amlodipine.  She was transitioned from losartan to valsartan 160 mg with continuation of carvedilol 12.5 mg twice daily.  She comes in doing very well from a cardiac perspective and is without symptoms of angina or cardiac decompensation.  No dizziness, presyncope, or syncope.  No falls, hematochezia, or melena.  No new neurological deficits.  Adherent and tolerating apixaban twice daily.  Blood pressure remains well-controlled.  She does not have any acute cardiac concerns at this time.   Labs independently reviewed: 08/2023 - potassium 3.9, BUN 20, serum creatinine 1.5, A1c 8.0 03/2023 - TC 111, TG 226, HDL 34, LDL 32, albumin 3.8, AST/ALT normal, Hgb 12.3, PLT 225 11/2022 - magnesium 2.0, LP(a) 17.6  Past Medical History:  Diagnosis Date   A-fib (HCC)    Depression    Diabetes mellitus without complication (HCC)    GERD (gastroesophageal reflux disease)    Hypercholesteremia    Hypertension     Past Surgical History:  Procedure Laterality Date   CORONARY/GRAFT ACUTE MI REVASCULARIZATION N/A 11/19/2022   Procedure: Coronary/Graft Acute MI Revascularization;  Surgeon: Iran Ouch, MD;  Location: [facility] INVASIVE CV LAB;  Service: Cardiovascular;  Laterality: N/A;   CYSTOSCOPY     LEFT HEART CATH AND CORONARY ANGIOGRAPHY N/A 11/19/2022   Procedure: LEFT HEART CATH AND CORONARY ANGIOGRAPHY;  Surgeon: Iran Ouch, MD;  Location: [facility] INVASIVE CV LAB;  Service: Cardiovascular;  Laterality: N/A;    Current Medications: Current Meds  Medication Sig   ALPRAZolam (XANAX) 0.25 MG tablet Take 0.25 mg by mouth at bedtime as needed for sleep.   apixaban (ELIQUIS) 5 MG TABS tablet Take 1 tablet (5 mg total)  by mouth 2 (two) times daily.   carvedilol (COREG) 12.5 MG tablet Take 1 tablet (12.5 mg total) by mouth 2 (two) times daily.   chlorthalidone (HYGROTON) 25 MG tablet Take 0.5 tablets (12.5 mg total) by mouth daily.   escitalopram (LEXAPRO) 5 MG tablet Take 5 mg by mouth daily.   JARDIANCE 10 MG TABS tablet Take 10 mg by mouth daily.   magnesium oxide (MAG-OX) 400 (240 Mg) MG tablet Take 1 tablet by mouth daily.   pantoprazole (PROTONIX) 40 MG tablet Take 1 tablet (40 mg total) by mouth daily.   rosuvastatin (CRESTOR) 20 MG tablet Take 1 tablet (20 mg total) by mouth daily.   Semaglutide,0.25 or 0.5MG /DOS, 2 MG/3ML SOPN Inject 0.5 mg into the skin once a week.   UNABLE TO FIND Take 1 tablet by mouth every Monday, Wednesday, and Friday. Med Name: Vitamin D3 and Vitamin B12   valsartan (DIOVAN) 160 MG tablet Take 1 tablet (160 mg total) by mouth daily.   [DISCONTINUED] clopidogrel (PLAVIX) 75 MG tablet Take 1 tablet (75 mg total) by mouth daily.    Allergies:   Keflex [cephalexin], Hydrochlorothiazide w-triamterene, Lisinopril, Penicillins, and Statins   Social  History   Socioeconomic History   Marital status: Widowed    Spouse name: Not on file   Number of children: Not on file   Years of education: Not on file   Highest education level: Not on file  Occupational History   Not on file  Tobacco Use   Smoking status: Former    Current packs/day: 0.00    Average packs/day: 0.3 packs/day for 20.0 years (5.0 ttl pk-yrs)    Types: Cigarettes    Start date: 64    Quit date: 2002    Years since quitting: 23.0   Smokeless tobacco: Never  Vaping Use   Vaping status: Never Used  Substance and Sexual Activity   Alcohol use: No   Drug use: No   Sexual activity: Not on file  Other Topics Concern   Not on file  Social History Narrative   Not on file   Social Drivers of Health   Financial Resource Strain: Patient Declined (03/05/2023)   Received from [facility],  [facility]   Overall Financial Resource Strain (CARDIA)    Difficulty of Paying Living Expenses: Patient declined  Food Insecurity: Patient Declined (03/05/2023)   Received from [facility], [facility]   Hunger Vital Sign    Worried About Running Out of Food in the Last Year: Patient declined    Ran Out of Food in the Last Year: Patient declined  Transportation Needs: Patient Declined (03/05/2023)   Received from [facility], [facility]   PRAPARE - Transportation    In the past 12 months, has lack of transportation kept you from medical appointments or from getting medications?: Patient declined    Lack of Transportation (Non-Medical): Patient declined  Physical Activity: Not on file  Stress: Not on file  Social Connections: Not on file     Family History:  The patient's family history includes CVA in her father and mother; Diabetes in her mother.  ROS:   12-point review of systems is negative unless otherwise noted in the HPI.   EKGs/Labs/Other Studies Reviewed:    Studies reviewed were summarized above. The additional studies were reviewed today:  2D echo 11/10/2018: - Left ventricle: Wall thickness was increased in a pattern of mild    LVH. Systolic function was normal. The estimated ejection    fraction was in the range of 55% to 60%. Doppler parameters are    consistent with abnormal left ventricular relaxation (grade 1    diastolic dysfunction).  - Right ventricle: The cavity size was mildly dilated.  - Pulmonary arteries: PA peak pressure: 37 mm Hg (S).  __________   LHC 11/19/2022:   1st Mrg lesion is 100% stenosed.   Prox LAD to Mid LAD lesion is 40% stenosed.   1st Diag lesion is 60% stenosed.   Mid LAD to Dist LAD lesion is 99% stenosed.   RPDA lesion is 85% stenosed.   Prox RCA to Mid RCA lesion is 40% stenosed.   Mid RCA to Dist RCA lesion is 99% stenosed.   A  drug-eluting stent was successfully placed using a STENT ONYX FRONTIER 3.5X30.   Post intervention, there is a 0% residual stenosis.   There is moderate left ventricular systolic dysfunction.   LV end diastolic pressure is severely elevated.   The left ventricular ejection fraction is 35-45% by visual estimate.   1.  Severe three-vessel coronary artery disease.  The culprit is a  subtotal occlusion of the mid right coronary artery.  In addition, the patient has occluded OM1 with collaterals and diffuse subocclusive disease throughout the mid and distal LAD. 2.  Moderately reduced LV systolic function with severely elevated left ventricular end-diastolic pressure 35 mmHg. 3.  Successful angioplasty and drug-eluting stent placement to the mid right coronary artery. 4.  Door to device was delayed by difficult access via the right radial artery given calcified, tortuous and stenosed proximal radial artery.  This was navigated successfully with a run-through wire using balloon assisted tracking.   Recommendations: Will treat with aspirin and ticagrelor for now. The patient is chronically anticoagulated for atrial fibrillation.  Anticoagulation can likely be resumed tomorrow if no bleeding issues but should consider switching Xarelto to Eliquis and stopping aspirin to minimize the risk of bleeding considering her age. I do not think the LAD is optimal for PCI given long diffuse disease.  Favor medical therapy. __________   2D echo 11/20/2022: 1. Left ventricular ejection fraction, by estimation, is 60 to 65%. The  left ventricle has normal function. The left ventricle demonstrates  regional wall motion abnormalities (see scoring diagram/findings for  description). There is mild left ventricular   hypertrophy. Left ventricular diastolic parameters are consistent with  Grade I diastolic dysfunction (impaired relaxation). Elevated left atrial  pressure. There is mild hypokinesis of the left ventricular,  basal-mid  inferior wall and inferolateral wall.   2. Right ventricular systolic function is normal. The right ventricular  size is normal. Tricuspid regurgitation signal is inadequate for assessing  PA pressure.   3. The mitral valve is normal in structure. No evidence of mitral valve  regurgitation. No evidence of mitral stenosis.   4. The aortic valve is tricuspid. Aortic valve regurgitation is trivial.  No aortic stenosis is present. __________   2D echo 05/23/2023: 1. Left ventricular ejection fraction, by estimation, is 55 to 60%. Left  ventricular ejection fraction by PLAX is 63 %. The left ventricle has  normal function. The left ventricle has no regional wall motion  abnormalities. There is mild left ventricular  hypertrophy. Left ventricular diastolic parameters are consistent with  Grade I diastolic dysfunction (impaired relaxation). The average left  ventricular global longitudinal strain is -16.4 %.   2. Right ventricular systolic function is normal. The right ventricular  size is normal. There is normal pulmonary artery systolic pressure. The  estimated right ventricular systolic pressure is 33.5 mmHg.   3. The mitral valve is normal in structure. Mild mitral valve  regurgitation. No evidence of mitral stenosis.   4. Tricuspid valve regurgitation is mild to moderate.   5. The aortic valve is tricuspid. Aortic valve regurgitation is mild. No  aortic stenosis is present.   6. The inferior vena cava is normal in size with greater than 50%  respiratory variability, suggesting right atrial pressure of 3 mmHg.   7. Agitated saline contrast bubble study was negative, with no evidence  of any interatrial shunt.   Comparison(s): EF 60%, mild LVH, mild hypokinesis basal-mid inferior wall,  and inferolateral wall.    EKG:  EKG is ordered today.  The EKG ordered today demonstrates NSR, 72 bpm, occasional PACs, poor R wave progression along the precordial leads, prior inferior MI,  nonspecific st/t changes  Recent Labs: 04/02/2023: ALT 11; BUN 26; Creatinine, Ser 1.02; Hemoglobin 12.3; Platelets 225; Potassium 3.5; Sodium 139  Recent Lipid Panel    Component Value Date/Time   CHOL 111 04/03/2023 0513   TRIG  226 (H) 04/03/2023 0513   HDL 34 (L) 04/03/2023 0513   CHOLHDL 3.3 04/03/2023 0513   VLDL 45 (H) 04/03/2023 0513   LDLCALC 32 04/03/2023 0513   LDLDIRECT 154 (H) 11/19/2022 1446    PHYSICAL EXAM:    VS:  BP 130/72   Pulse 72   Ht 4\' 9"  (1.448 m)   Wt 130 lb 6.4 oz (59.1 kg)   SpO2 97%   BMI 28.22 kg/m   BMI: Body mass index is 28.22 kg/m.  Physical Exam Vitals reviewed.  Constitutional:      Appearance: She is well-developed.  HENT:     Head: Normocephalic and atraumatic.  Eyes:     General:        Right eye: No discharge.        Left eye: No discharge.  Neck:     Vascular: No JVD.  Cardiovascular:     Rate and Rhythm: Normal rate and regular rhythm.     Pulses:          Posterior tibial pulses are 2+ on the right side and 2+ on the left side.     Heart sounds: Normal heart sounds, S1 normal and S2 normal. Heart sounds not distant. No midsystolic click and no opening snap. No murmur heard.    No friction rub.  Pulmonary:     Effort: Pulmonary effort is normal. No respiratory distress.     Breath sounds: Normal breath sounds. No decreased breath sounds, wheezing, rhonchi or rales.  Chest:     Chest wall: No tenderness.  Abdominal:     General: There is no distension.  Musculoskeletal:     Cervical back: Normal range of motion.     Right lower leg: No edema.     Left lower leg: No edema.  Skin:    General: Skin is warm and dry.     Nails: There is no clubbing.  Neurological:     Mental Status: She is alert and oriented to person, place, and time.  Psychiatric:        Speech: Speech normal.        Behavior: Behavior normal.        Thought Content: Thought content normal.        Judgment: Judgment normal.     Wt Readings from  Last 3 Encounters:  12/12/23 130 lb 6.4 oz (59.1 kg)  09/10/23 133 lb 3.2 oz (60.4 kg)  06/11/23 132 lb 3.2 oz (60 kg)     ASSESSMENT & PLAN:   CAD involving the native coronary arteries without angina: She continues to do well and is without symptoms of angina or cardiac decompensation.  Given she is now greater than 12 months from PCI, stop clopidogrel to minimize risk of bleeding.  Continue Eliquis in place of ASA given Afib.  She will otherwise continue Coreg and Crestor.  No indication for further ischemic testing at this time as residual LAD stenosis is not optimal for PCI with recommendation for medical therapy.   PAF: Maintaining sinus rhythm on Coreg.  CHA2DS2-VASc at least 8.  She remains on apixaban 5 mg twice daily and does not meet reduced dosing criteria based on baseline SCr. No symptoms concerning for bleeding.  Check BMP and CBC.   HTN: Blood pressure is well controlled in the office today.  Continue Coreg 12.5 mg bid, chlorthalidone 12.5 mg, and valsartan 160 mg.   HLD: LDL 32 in 03/2023 with normal AST/ALT at that time.  Continue  rosuvastatin 20 mg.  CVA/TIA: No residual deficits. She remains on apixaban and rosuvastatin. Echo with negative bubble study.    Disposition: F/u with Dr. Kirke Corin or an APP in 6 months.   Medication Adjustments/Labs and Tests Ordered: Current medicines are reviewed at length with the patient today.  Concerns regarding medicines are outlined above. Medication changes, Labs and Tests ordered today are summarized above and listed in the Patient Instructions accessible in Encounters.   Signed, Eula Listen, PA-C 12/12/2023 3:40 PM     [facility] [place] [address] [place], [place] [address] [phone]

## 2023-12-12 NOTE — Patient Instructions (Signed)
Medication Instructions:  Your physician recommends the following medication changes.  STOP TAKING: Plavix  *If you need a refill on your cardiac medications before your next appointment, please call your pharmacy*   Lab Work: Your provider would like for you to have following labs drawn today BMT and CBC.   If you have labs (blood work) drawn today and your tests are completely normal, you will receive your results only by: MyChart Message (if you have MyChart) OR A paper copy in the mail If you have any lab test that is abnormal or we need to change your treatment, we will call you to review the results.   Follow-Up: At [facility], you and your health needs are our priority.  As part of our continuing mission to provide you with exceptional heart care, we have created designated Provider Care Teams.  These Care Teams include your primary Cardiologist (physician) and Advanced Practice Providers (APPs -  Physician Assistants and Nurse Practitioners) who all work together to provide you with the care you need, when you need it.  We recommend signing up for the patient portal called "MyChart".  Sign up information is provided on this After Visit Summary.  MyChart is used to connect with patients for Virtual Visits (Telemedicine).  Patients are able to view lab/test results, encounter notes, upcoming appointments, etc.  Non-urgent messages can be sent to your provider as well.   To learn more about what you can do with MyChart, go to [email or web address].    Your next appointment:   6 month(s)  Provider:   You may see Lorine Bears, MD or one of the following Advanced Practice Providers on your designated Care Team:   Eula Listen, [place]

## 2023-12-13 LAB — BASIC METABOLIC PANEL
BUN/Creatinine Ratio: 20 (ref 12–28)
BUN: 27 mg/dL (ref 8–27)
CO2: 25 mmol/L (ref 20–29)
Calcium: 9.8 mg/dL (ref 8.7–10.3)
Chloride: 101 mmol/L (ref 96–106)
Creatinine, Ser: 1.37 mg/dL — ABNORMAL HIGH (ref 0.57–1.00)
Glucose: 155 mg/dL — ABNORMAL HIGH (ref 70–99)
Potassium: 4.4 mmol/L (ref 3.5–5.2)
Sodium: 143 mmol/L (ref 134–144)
eGFR: 38 mL/min/{1.73_m2} — ABNORMAL LOW (ref >59)

## 2023-12-13 LAB — CBC
Hematocrit: 43 % (ref 34.0–46.6)
Hemoglobin: 13.6 g/dL (ref 11.1–15.9)
MCH: 27.2 pg (ref 26.6–33.0)
MCHC: 31.6 g/dL (ref 31.5–35.7)
MCV: 86 fL (ref 79–97)
Platelets: 245 10*3/uL (ref 150–450)
RBC: 5 x10E6/uL (ref 3.77–5.28)
RDW: 13.1 % (ref 11.7–15.4)
WBC: 6.8 10*3/uL (ref 3.4–10.8)

## 2023-12-14 ENCOUNTER — Ambulatory Visit
Admission: EM | Admit: 2023-12-14 | Discharge: 2023-12-14 | Discharge status: Against Medical Advice | Payer: Medicare PPO | Attending: Family Medicine | Admitting: Family Medicine

## 2023-12-14 ENCOUNTER — Encounter: Payer: Self-pay | Admitting: Emergency Medicine

## 2023-12-14 NOTE — ED Triage Notes (Signed)
Patient states that she woke up this morning with nose bleed from her left nostril.  Patient states that she was able to stop the bleeding in 5 min.  Patient has no bleeding at this time.  Patient is on a blood thinner.

## 2023-12-31 IMAGING — CR DG CHEST 2V
2 series · 2 of 2 positions shown · non-contrast
Comparison: CT 12/04/2018

CLINICAL DATA: point tender on C5-T2, pain L thoracic area worse
when laying down

EXAM:
CHEST - 2 VIEW

[chest pa]
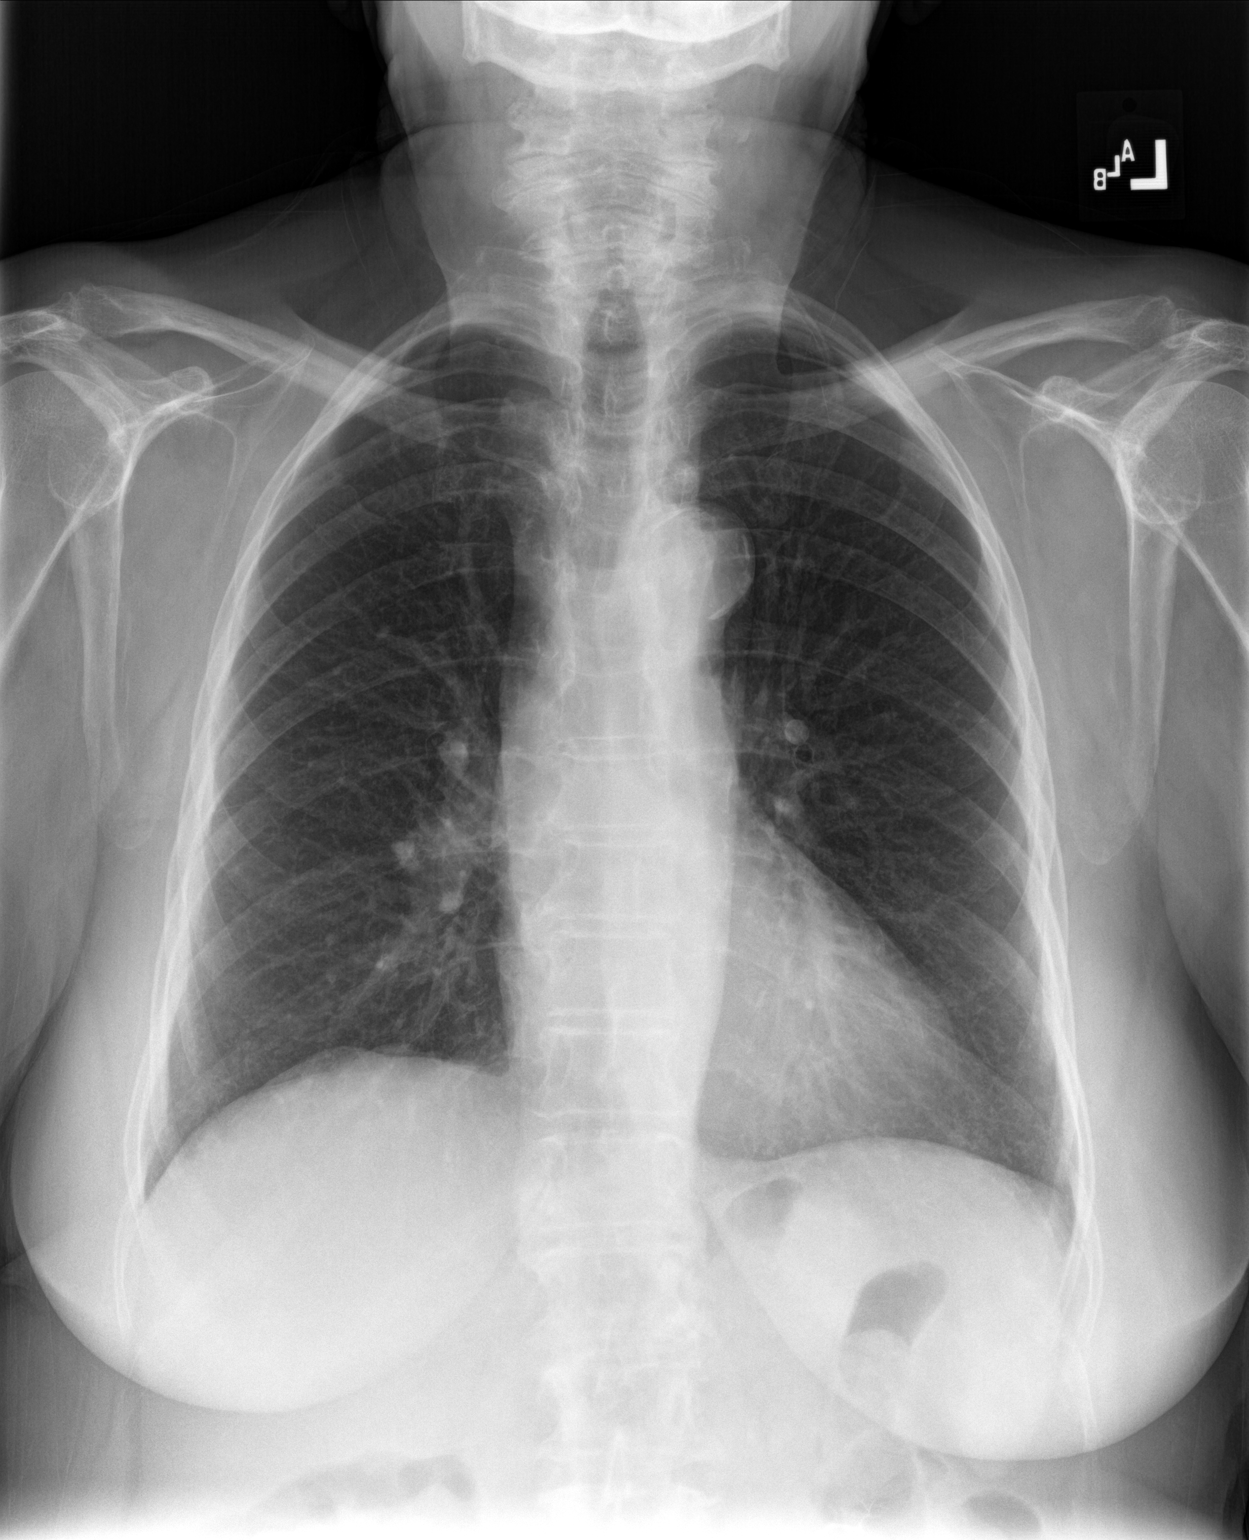

[chest lat]
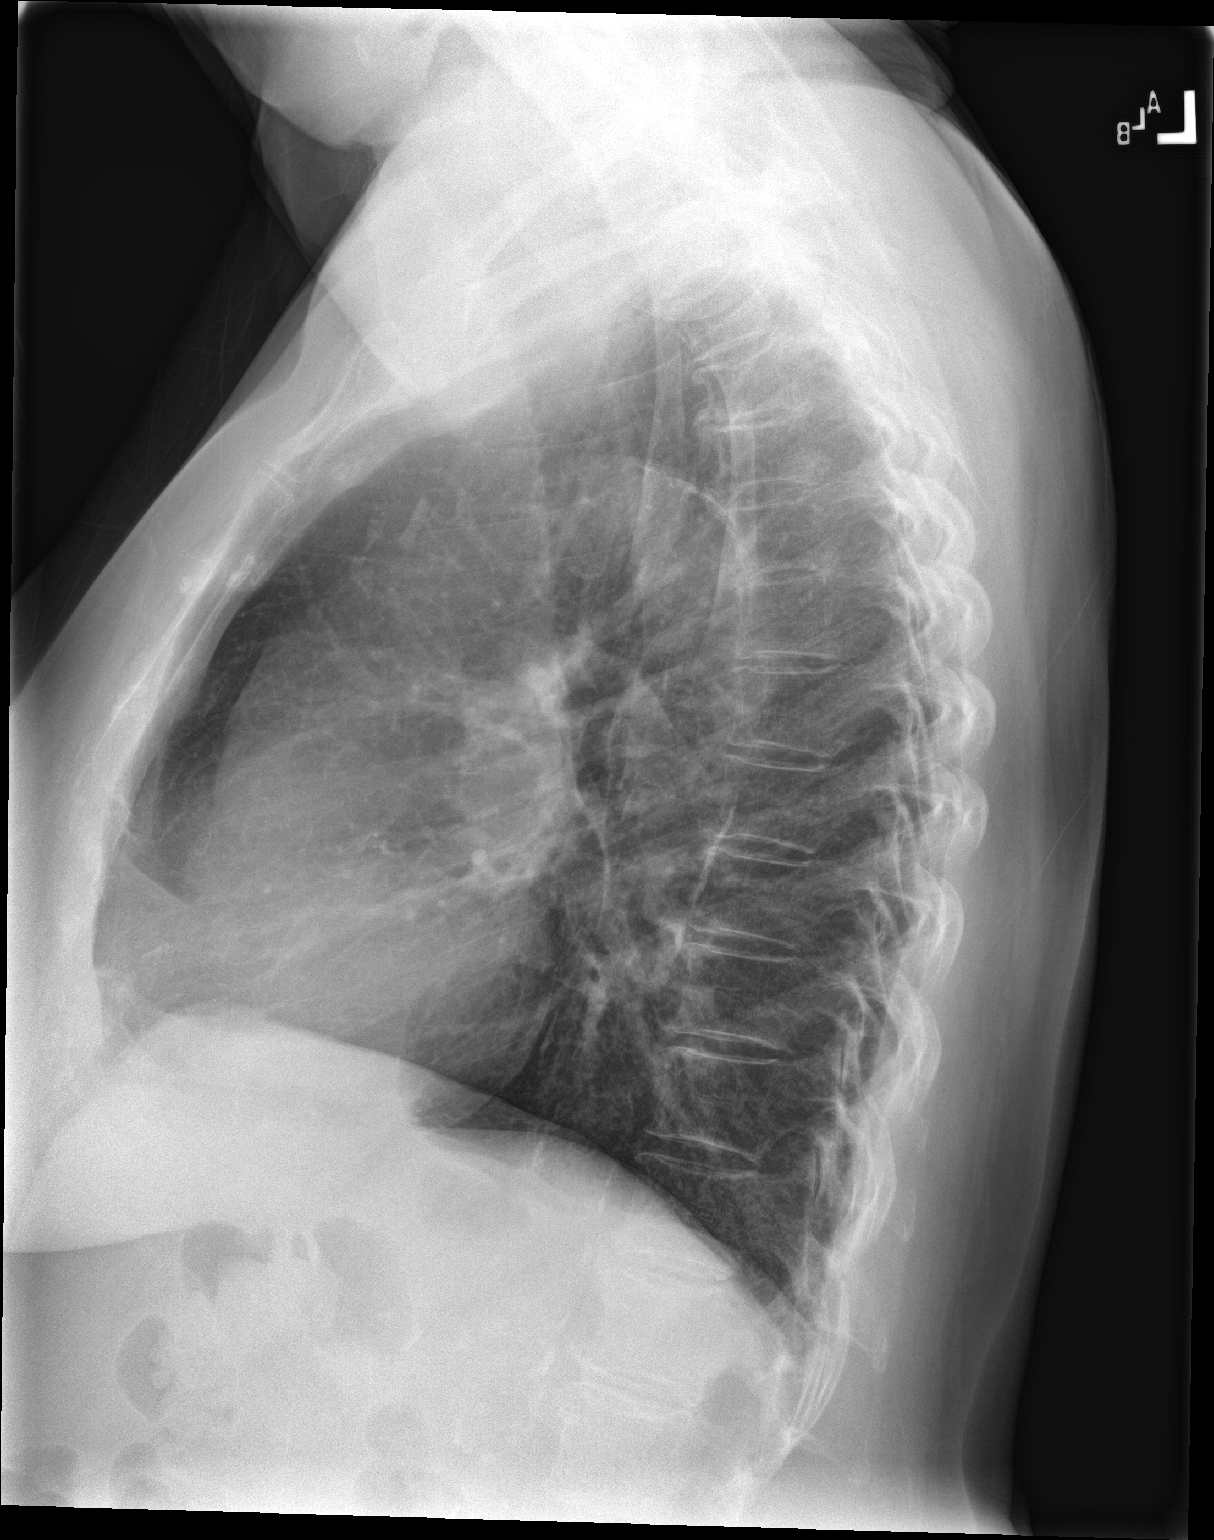

[2 of 2 positions shown; findings below may reference images not displayed]

FINDINGS: Unchanged cardiomediastinal silhouette. There is no focal airspace
consolidation. There is no pleural effusion. No pneumothorax. There
is no acute osseous abnormality. Unchanged chronic compression
deformities of T2 and T3. There is no evidence of acute fracture.
IMPRESSION: No evidence of acute cardiopulmonary disease.

Unchanged chronic compression deformities of T2 and T3.

## 2023-12-31 IMAGING — CR DG THORACIC SPINE 2V
3 series · 3 of 3 positions shown · non-contrast
Comparison: CT 12/04/2018.

CLINICAL DATA: point tender on C5-T2, pain L thoracic area worse
when laying down

EXAM:
THORACIC SPINE 2 VIEWS

[t-spine ap]
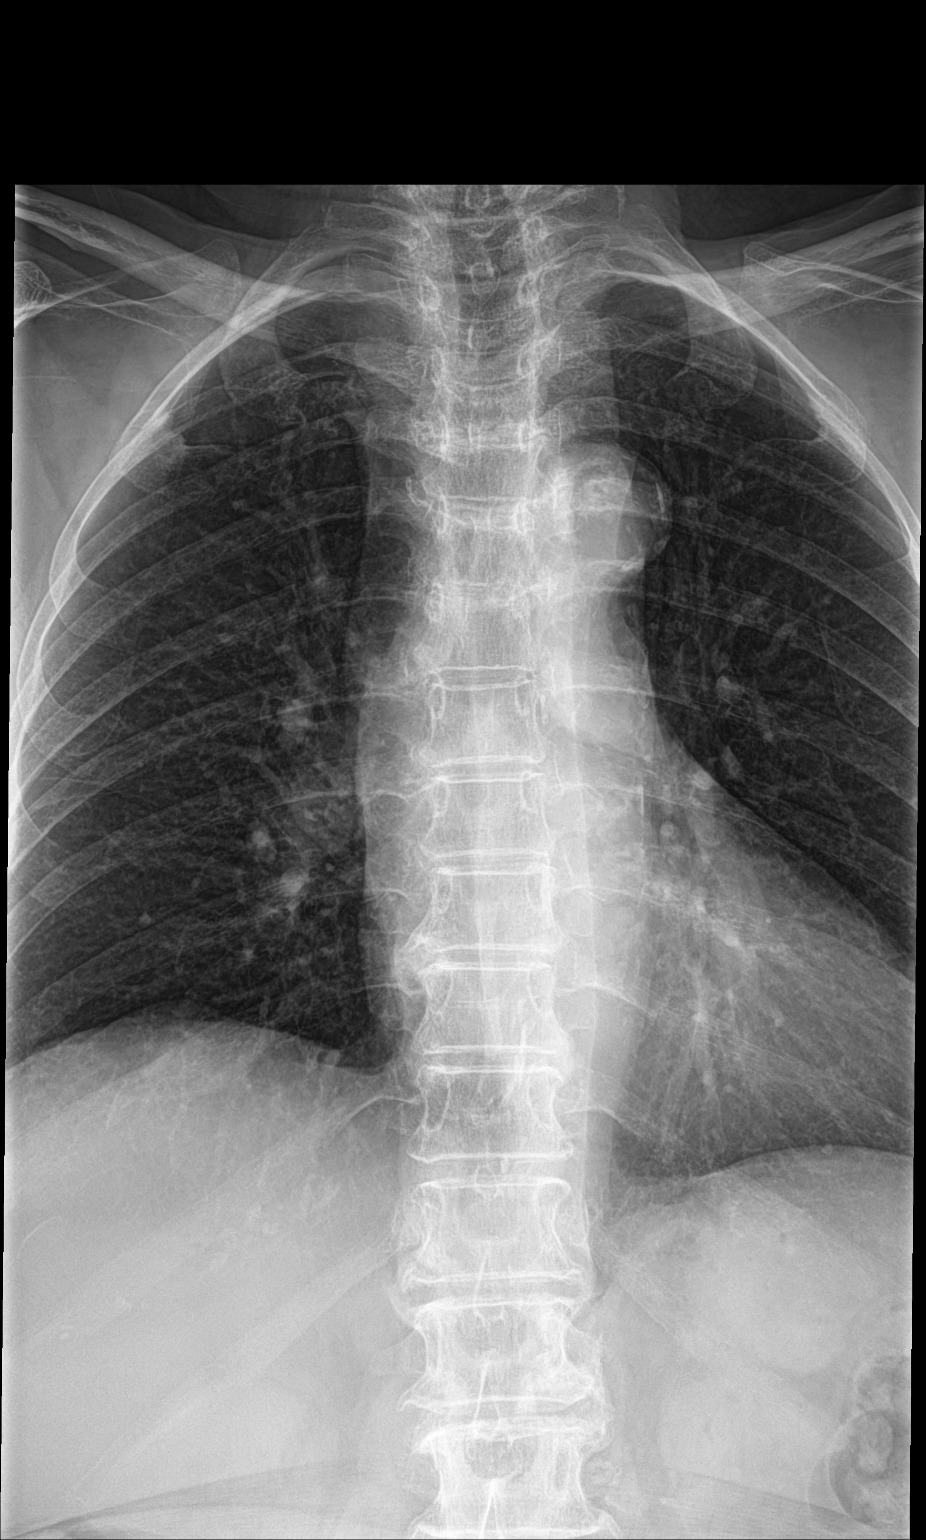

[t-spine lat]
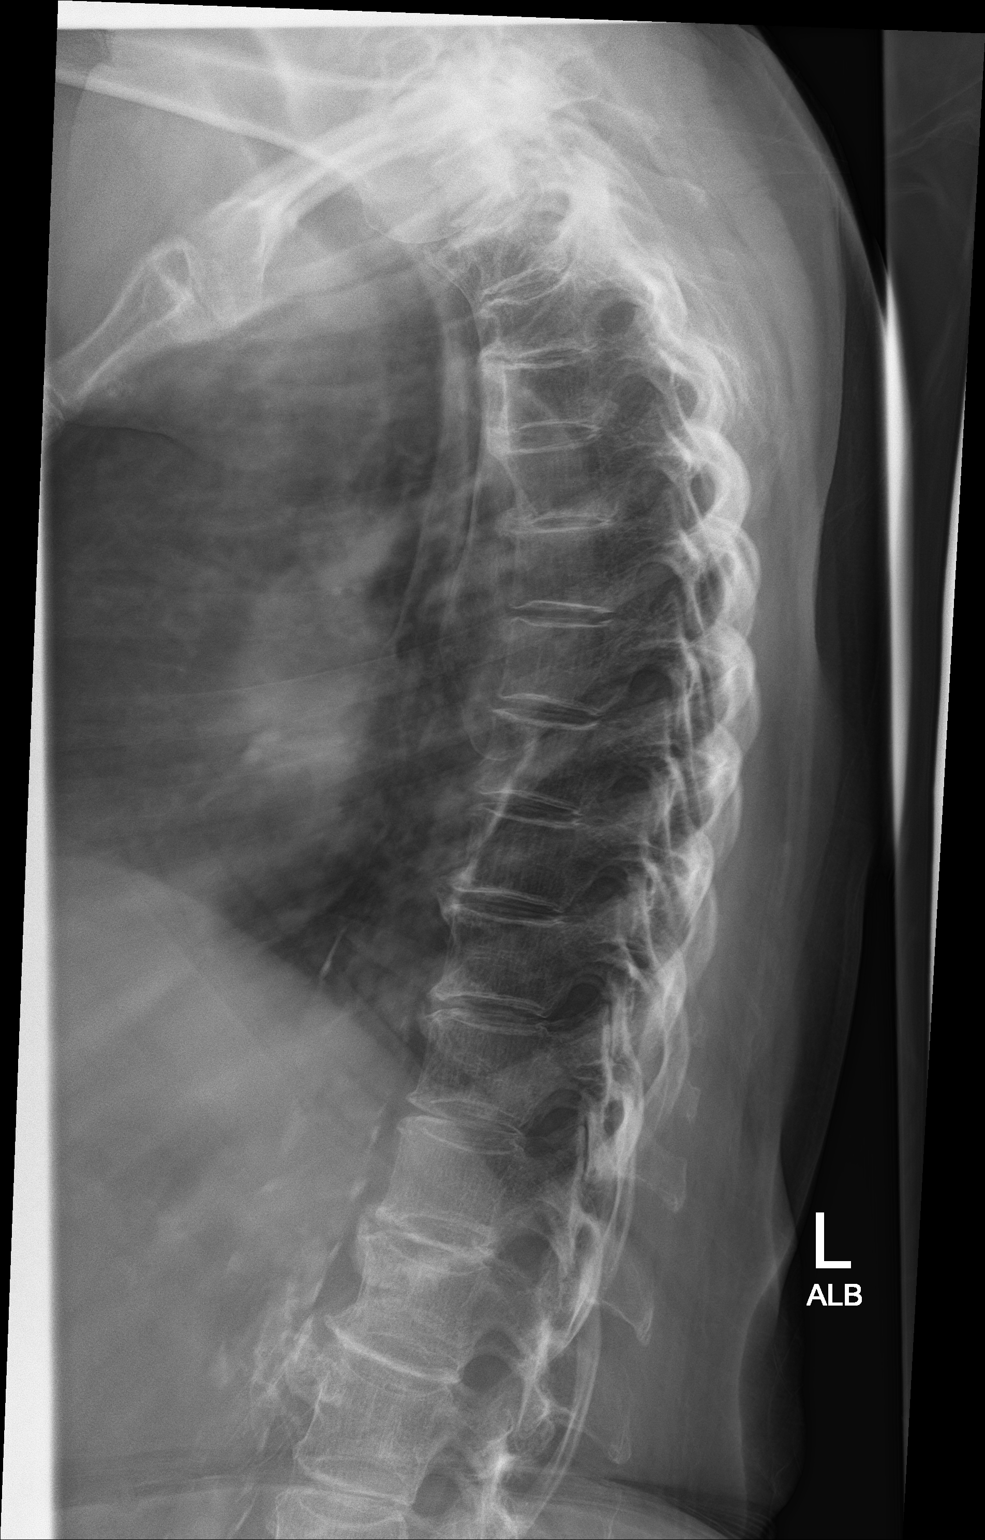

[t-spine swimmers]
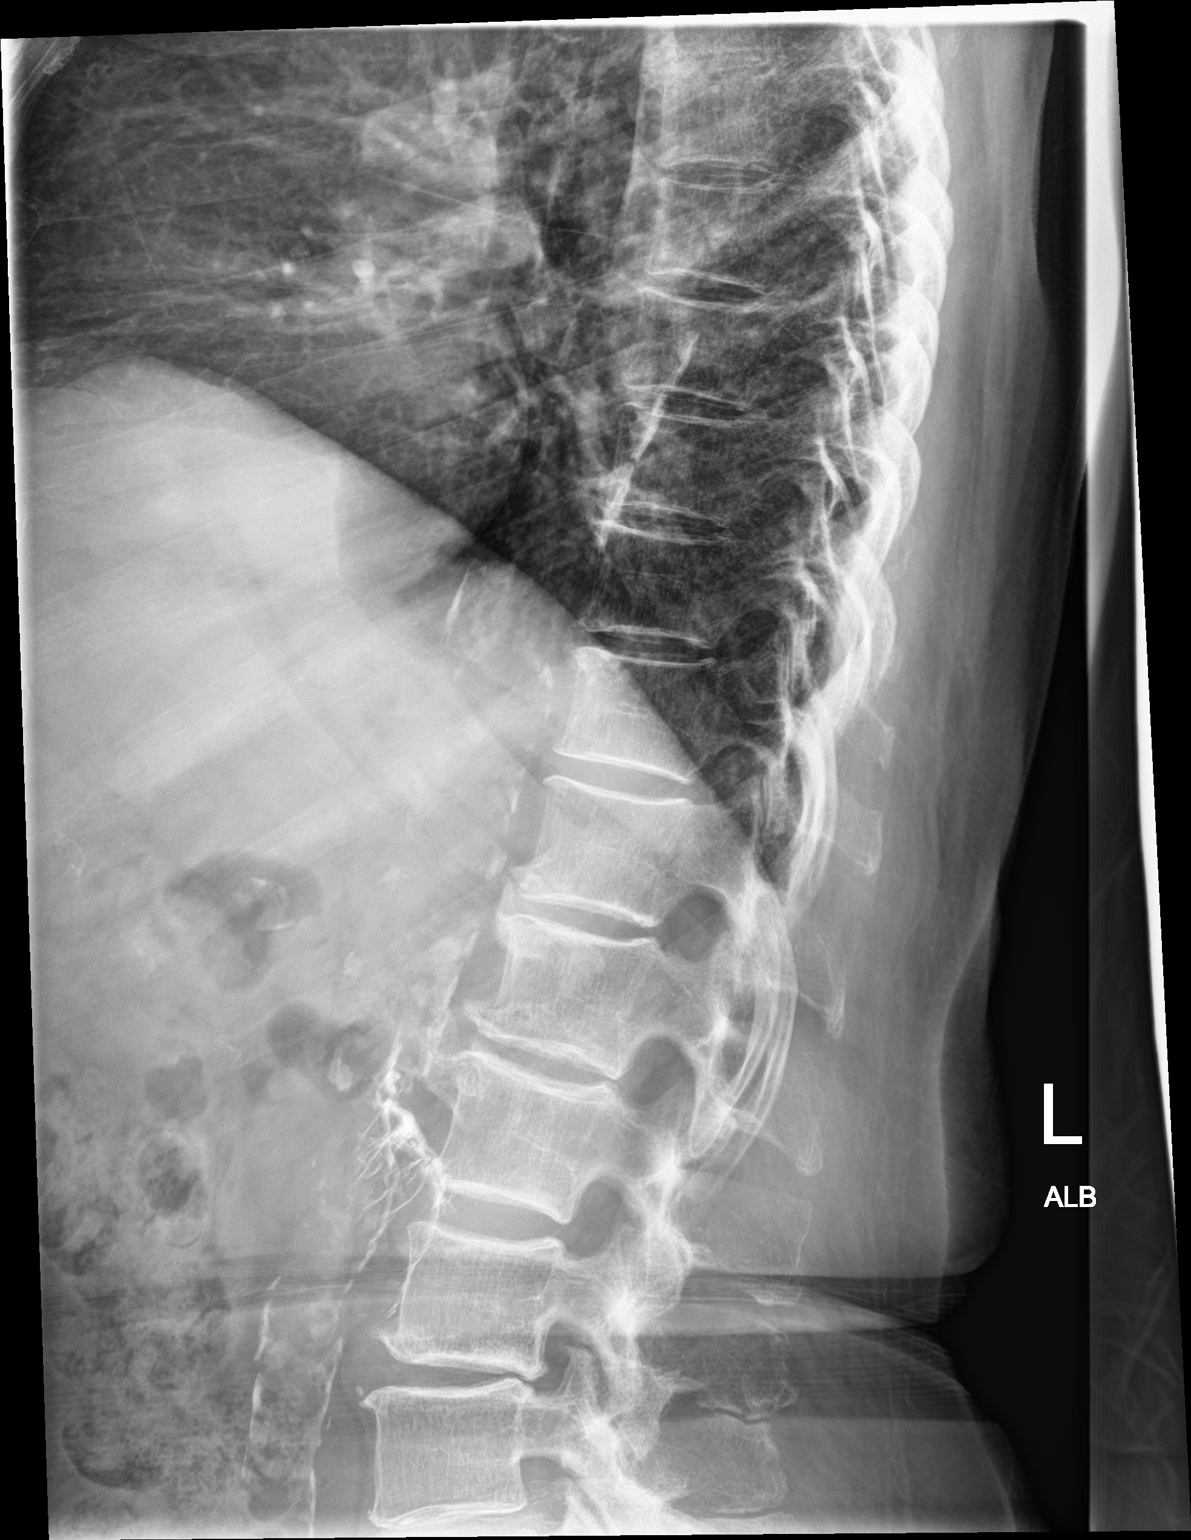

[3 of 3 positions shown; findings below may reference images not displayed]

FINDINGS: There are unchanged chronic compression deformities of T2 and T3.
There is no evidence of acute fracture. There are mild to moderate
degenerative changes of the thoracic spine.
IMPRESSION: Unchanged chronic compression deformities of T2 and T3. No evidence
of acute thoracic spine fracture.

Mild to moderate multilevel degenerative changes of the thoracic
spine.

## 2024-02-24 ENCOUNTER — Other Ambulatory Visit: Payer: Self-pay | Admitting: Physician Assistant

## 2024-03-09 ENCOUNTER — Other Ambulatory Visit: Payer: Self-pay | Admitting: Physician Assistant

## 2024-04-08 ENCOUNTER — Other Ambulatory Visit: Payer: Self-pay | Admitting: Physician Assistant

## 2024-05-28 ENCOUNTER — Other Ambulatory Visit: Payer: Self-pay | Admitting: Physician Assistant

## 2024-05-29 ENCOUNTER — Other Ambulatory Visit: Payer: Self-pay | Admitting: *Deleted

## 2024-05-29 MED ORDER — PANTOPRAZOLE SODIUM 40 MG PO TBEC: 40.0000 mg | DELAYED_RELEASE_TABLET | Freq: Every day | ORAL | 0 refills | Status: AC

## 2024-05-29 NOTE — Progress Notes (Signed)
 Refill sent to [facility] for Pantoprazole  (Protonix ) 40 mg. Verbal order from Varney Gentleman, [place]

## 2024-06-08 NOTE — Progress Notes (Unsigned)
 Cardiology Office Note    Date:  06/09/2024   ID:  Vanessa, Young 1941/07/05, MRN [id number]  PCP:  Calista Bradley, MD  Cardiologist:  Deatrice Cage, MD  Electrophysiologist:  None   Chief Complaint: Follow-up  History of Present Illness:   Vanessa Young is a 83 y.o. female with history of CAD with inferior STEMI in 11/2022 s/p PCI/DES to the RCA, PAF on apixaban , CVA/TIA, DM2, HTN, and HLD who presents for follow-up of CAD and A-fib.   She was previously followed by Drs. Bosie armin Cleveland, with [facility], for A-fib with transition of her care to our office during admission in 11/2022.   Prior to her admission in 11/2022, she had no known history of ischemic heart disease.  She was seen in the ED on 11/18/2022 with generalized weakness and headache and felt to be volume depleted with symptomatic improvement following IV fluids.  Following discharge, she began to have substernal chest pain described as a burning sensation across the whole chest radiating into the bilateral arms.  She slept and felt better, though was woken up with substernal chest tightness and heartburn.  EMS was called with EKG in the field showing evidence of an inferior ST elevation MI leading to activation of code STEMI.  Upon cardiology evaluation, she was still having angina.  She reported that she did not take Xarelto  daily, instead took this 3 times weekly.  Emergent LHC showed severe three-vessel CAD with the culprit lesion being a subtotal occlusion of the mid RCA.  In addition, there was an occluded OM1 with collaterals and diffuse subocclusive disease throughout the mid and distal LAD.  Moderately reduced LV systolic function estimated at 35 to 45% with severely elevated LVEDP at 35 mmHg.  She underwent successful PCI/DES to the mid RCA.  It was felt the LAD was not optimal for PCI given long diffuse disease with medical therapy favored.  Post intervention echo during the admission showed an EF of 60 to 65%, mild  hypokinesis of the basal mid inferior wall and inferolateral wall, mild LVH, grade 1 diastolic dysfunction, normal RV systolic function and ventricular cavity size, and trivial aortic insufficiency.  High-sensitivity troponin peaked at 3612.  From a cardiac perspective, she was discharged on apixaban , clopidogrel , carvedilol , amlodipine , losartan , and rosuvastatin .   Following discharge, she was readmitted to the hospital with sudden onset of left-sided weakness with expressive aphasia and dysarthria which resolved prior to presentation.  MRI of the brain notable for multiple small embolic infarcts.  She was evaluated by neurology with recommendation to continue apixaban  and clopidogrel  as well as recently titrated dose of rosuvastatin .  Prilosec was changed to Protonix .     She was seen in the ED on 04/02/2023 with aphasia and headache that started several hours prior.  BP in the 190s to low 200s systolic in the ER.  CT head showed no acute intracranial abnormality.  Neurology was tele-consulted.  MRI of the brain showed no acute intracranial abnormality.  MRA of the head/neck showed no emergent large vessel occlusive disease or high-grade stenosis with mild narrowing of the right MCA M1 segment and findings consistent with chronic small vessel ischemia and volume loss.  Patient returned to baseline without further intervention.  No changes were made in outpatient medications.    She was seen in the office in 03/2023 and reported some exertional chest heaviness that improved after episodes of belching, and predated her MI.  She was started on Imdur   30 mg, however this had to be discontinued due to headache.  Given her TIA, she underwent an echo on 05/23/2023 demonstrated an EF of 55 to 60%, no regional wall motion abnormalities, mild LVH, grade 1 diastolic dysfunction, normal RV systolic function and ventricular cavity size, RVSP 33.5 mmHg, mild mitral regurgitation, mild to moderate tricuspid regurgitation, mild  aortic insufficiency, normal CVP, and negative bubble study.   She was seen in the office in 08/2023 for discussion of elevated BP readings.  She reported BPs predominantly in the 130s mmHg systolic with an occasional 150s.  Previously noted fatigue was improved off amlodipine .  She was transitioned from losartan  to valsartan  160 mg with continuation of carvedilol  12.5 mg twice daily.  She was last seen in the office in 11/2023 and was doing well from a cardiac perspective.  No changes in pharmacotherapy were indicated at that time.  She comes in today continuing to do well from a cardiac perspective and is without symptoms of angina or cardiac decompensation.  No dizziness, presyncope, or syncope.  No falls, hematochezia, or melena.  She does note some longstanding belching and flatus.  Has started a probiotic.  Blood pressure well-controlled.  No lower extremity swelling or progressive orthopnea.  Active at baseline.  No acute cardiac concerns at this time.   Labs independently reviewed: 02/2024 - TC 134, TG 222, HDL 36, LDL 54, potassium 4, BUN 31, serum creatinine 1.2, A1c 7.5 11/2023 - Hgb 13.6, PLT 245 03/2023 - albumin 3.8, AST/ALT normal 11/2022 - LP(a) 17.6  Past Medical History:  Diagnosis Date   A-fib (HCC)    Depression    Diabetes mellitus without complication (HCC)    GERD (gastroesophageal reflux disease)    Hypercholesteremia    Hypertension     Past Surgical History:  Procedure Laterality Date   CORONARY/GRAFT ACUTE MI REVASCULARIZATION N/A 11/19/2022   Procedure: Coronary/Graft Acute MI Revascularization;  Surgeon: Darron Deatrice LABOR, MD;  Location: [facility] INVASIVE CV LAB;  Service: Cardiovascular;  Laterality: N/A;   CYSTOSCOPY     LEFT HEART CATH AND CORONARY ANGIOGRAPHY N/A 11/19/2022   Procedure: LEFT HEART CATH AND CORONARY ANGIOGRAPHY;  Surgeon: Darron Deatrice LABOR, MD;  Location: [facility] INVASIVE CV LAB;  Service: Cardiovascular;  Laterality: N/A;    Current  Medications: Current Meds  Medication Sig   ACCU-CHEK GUIDE test strip    ALPRAZolam  (XANAX ) 0.25 MG tablet Take 0.25 mg by mouth at bedtime as needed for sleep.   escitalopram  (LEXAPRO ) 5 MG tablet Take 5 mg by mouth daily.   magnesium  oxide (MAG-OX) 400 (240 Mg) MG tablet Take 1 tablet by mouth daily.   pantoprazole  (PROTONIX ) 40 MG tablet Take 1 tablet (40 mg total) by mouth daily.   Semaglutide,0.25 or 0.5MG /DOS, 2 MG/3ML SOPN Inject 0.5 mg into the skin once a week.   [DISCONTINUED] apixaban  (ELIQUIS ) 5 MG TABS tablet Take 1 tablet (5 mg total) by mouth 2 (two) times daily.   [DISCONTINUED] carvedilol  (COREG ) 12.5 MG tablet Take 1 tablet (12.5 mg total) by mouth 2 (two) times daily.   [DISCONTINUED] JARDIANCE 10 MG TABS tablet Take 10 mg by mouth daily.   [DISCONTINUED] rosuvastatin  (CRESTOR ) 20 MG tablet Take 1 tablet (20 mg total) by mouth daily.   [DISCONTINUED] valsartan  (DIOVAN ) 160 MG tablet Take 1 tablet (160 mg total) by mouth daily.    Allergies:   Keflex [cephalexin], Hydrochlorothiazide-triamterene, Lisinopril, Penicillins, and Statins   Social History   Socioeconomic History   Marital status:  Widowed    Spouse name: Not on file   Number of children: Not on file   Years of education: Not on file   Highest education level: Not on file  Occupational History   Not on file  Tobacco Use   Smoking status: Former    Current packs/day: 0.00    Average packs/day: 0.3 packs/day for 20.0 years (5.0 ttl pk-yrs)    Types: Cigarettes    Start date: 17    Quit date: 2002    Years since quitting: 23.4   Smokeless tobacco: Never  Vaping Use   Vaping status: Never Used  Substance and Sexual Activity   Alcohol use: No   Drug use: No   Sexual activity: Not on file  Other Topics Concern   Not on file  Social History Narrative   Not on file   Social Drivers of Health   Financial Resource Strain: Low Risk  (03/06/2024)   Received from [facility]    Overall Financial Resource Strain (CARDIA)    Difficulty of Paying Living Expenses: Not hard at all  Food Insecurity: No Food Insecurity (03/06/2024)   Received from [facility]   Hunger Vital Sign    Within the past 12 months, you worried that your food would run out before you got the money to buy more.: Never true    Within the past 12 months, the food you bought just didn't last and you didn't have money to get more.: Never true  Transportation Needs: No Transportation Needs (03/06/2024)   Received from [facility] - Transportation    In the past 12 months, has lack of transportation kept you from medical appointments or from getting medications?: No    Lack of Transportation (Non-Medical): No  Physical Activity: Not on file  Stress: Not on file  Social Connections: Not on file     Family History:  The patient's family history includes CVA in her father and mother; Diabetes in her mother.  ROS:   12-point review of systems is negative unless otherwise noted in the HPI.   EKGs/Labs/Other Studies Reviewed:    Studies reviewed were summarized above. The additional studies were reviewed today:  2D echo 11/10/2018: - Left ventricle: Wall thickness was increased in a pattern of mild    LVH. Systolic function was normal. The estimated ejection    fraction was in the range of 55% to 60%. Doppler parameters are    consistent with abnormal left ventricular relaxation (grade 1    diastolic dysfunction).  - Right ventricle: The cavity size was mildly dilated.  - Pulmonary arteries: PA peak pressure: 37 mm Hg (S).  __________   LHC 11/19/2022:   1st Mrg lesion is 100% stenosed.   Prox LAD to Mid LAD lesion is 40% stenosed.   1st Diag lesion is 60% stenosed.   Mid LAD to Dist LAD lesion is 99% stenosed.   RPDA lesion is 85% stenosed.   Prox RCA to Mid RCA lesion is 40% stenosed.   Mid RCA to Dist RCA lesion is 99% stenosed.   A  drug-eluting stent was successfully placed using a STENT ONYX FRONTIER 3.5X30.   Post intervention, there is a 0% residual stenosis.   There is moderate left ventricular systolic dysfunction.   LV end diastolic pressure is severely elevated.   The left ventricular ejection fraction is 35-45% by visual estimate.   1.  Severe three-vessel coronary artery disease.  The culprit is a subtotal occlusion of the mid right coronary artery.  In addition, the patient has occluded OM1 with collaterals and diffuse subocclusive disease throughout the mid and distal LAD. 2.  Moderately reduced LV systolic function with severely elevated left ventricular end-diastolic pressure 35 mmHg. 3.  Successful angioplasty and drug-eluting stent placement to the mid right coronary artery. 4.  Door to device was delayed by difficult access via the right radial artery given calcified, tortuous and stenosed proximal radial artery.  This was navigated successfully with a run-through wire using balloon assisted tracking.   Recommendations: Will treat with aspirin  and ticagrelor  for now. The patient is chronically anticoagulated for atrial fibrillation.  Anticoagulation can likely be resumed tomorrow if no bleeding issues but should consider switching Xarelto  to Eliquis  and stopping aspirin  to minimize the risk of bleeding considering her age. I do not think the LAD is optimal for PCI given long diffuse disease.  Favor medical therapy. __________   2D echo 11/20/2022: 1. Left ventricular ejection fraction, by estimation, is 60 to 65%. The  left ventricle has normal function. The left ventricle demonstrates  regional wall motion abnormalities (see scoring diagram/findings for  description). There is mild left ventricular   hypertrophy. Left ventricular diastolic parameters are consistent with  Grade I diastolic dysfunction (impaired relaxation). Elevated left atrial  pressure. There is mild hypokinesis of the left ventricular,  basal-mid  inferior wall and inferolateral wall.   2. Right ventricular systolic function is normal. The right ventricular  size is normal. Tricuspid regurgitation signal is inadequate for assessing  PA pressure.   3. The mitral valve is normal in structure. No evidence of mitral valve  regurgitation. No evidence of mitral stenosis.   4. The aortic valve is tricuspid. Aortic valve regurgitation is trivial.  No aortic stenosis is present. __________   2D echo 05/23/2023: 1. Left ventricular ejection fraction, by estimation, is 55 to 60%. Left  ventricular ejection fraction by PLAX is 63 %. The left ventricle has  normal function. The left ventricle has no regional wall motion  abnormalities. There is mild left ventricular  hypertrophy. Left ventricular diastolic parameters are consistent with  Grade I diastolic dysfunction (impaired relaxation). The average left  ventricular global longitudinal strain is -16.4 %.   2. Right ventricular systolic function is normal. The right ventricular  size is normal. There is normal pulmonary artery systolic pressure. The  estimated right ventricular systolic pressure is 33.5 mmHg.   3. The mitral valve is normal in structure. Mild mitral valve  regurgitation. No evidence of mitral stenosis.   4. Tricuspid valve regurgitation is mild to moderate.   5. The aortic valve is tricuspid. Aortic valve regurgitation is mild. No  aortic stenosis is present.   6. The inferior vena cava is normal in size with greater than 50%  respiratory variability, suggesting right atrial pressure of 3 mmHg.   7. Agitated saline contrast bubble study was negative, with no evidence  of any interatrial shunt.   Comparison(s): EF 60%, mild LVH, mild hypokinesis basal-mid inferior wall,  and inferolateral wall.    EKG:  EKG is ordered today.  The EKG ordered today demonstrates NSR, 68 bpm, first-degree AV block, left axis deviation, poor R wave progression along the precordial  leads, prior inferior MI, nonspecific anterolateral ST-T changes consistent with prior tracing  Recent Labs: 12/12/2023: BUN 27; Creatinine, Ser 1.37; Hemoglobin 13.6; Platelets 245; Potassium 4.4; Sodium 143  Recent Lipid Panel    Component  Value Date/Time   CHOL 111 04/03/2023 0513   TRIG 226 (H) 04/03/2023 0513   HDL 34 (L) 04/03/2023 0513   CHOLHDL 3.3 04/03/2023 0513   VLDL 45 (H) 04/03/2023 0513   LDLCALC 32 04/03/2023 0513   LDLDIRECT 154 (H) 11/19/2022 1446    PHYSICAL EXAM:    VS:  BP 136/80 (BP Location: Left Arm, Patient Position: Sitting)   Pulse 71   Resp 17   Ht 4' 9 (1.448 m)   Wt 128 lb 12.8 oz (58.4 kg)   SpO2 98%   BMI 27.87 kg/m   BMI: Body mass index is 27.87 kg/m.  Physical Exam Vitals reviewed.  Constitutional:      Appearance: She is well-developed.  HENT:     Head: Normocephalic and atraumatic.   Eyes:     General:        Right eye: No discharge.        Left eye: No discharge.    Cardiovascular:     Rate and Rhythm: Normal rate and regular rhythm.     Pulses:          Posterior tibial pulses are 2+ on the right side and 2+ on the left side.     Heart sounds: Normal heart sounds, S1 normal and S2 normal. Heart sounds not distant. No midsystolic click and no opening snap. No murmur heard.    No friction rub.  Pulmonary:     Effort: Pulmonary effort is normal. No respiratory distress.     Breath sounds: Normal breath sounds. No decreased breath sounds, wheezing, rhonchi or rales.  Chest:     Chest wall: No tenderness.   Musculoskeletal:     Cervical back: Normal range of motion.     Right lower leg: No edema.     Left lower leg: No edema.   Skin:    General: Skin is warm and dry.     Nails: There is no clubbing.   Neurological:     Mental Status: She is alert and oriented to person, place, and time.   Psychiatric:        Speech: Speech normal.        Behavior: Behavior normal.        Thought Content: Thought content normal.         Judgment: Judgment normal.     Wt Readings from Last 3 Encounters:  06/09/24 128 lb 12.8 oz (58.4 kg)  12/14/23 130 lb 4.7 oz (59.1 kg)  12/12/23 130 lb 6.4 oz (59.1 kg)     ASSESSMENT & PLAN:   CAD involving the native coronary arteries without angina: She continues to do well and is without symptoms of angina or cardiac decompensation.  Continue aggressive risk factor modification and secondary prevention including apixaban  in place of aspirin  given underlying A-fib as well as carvedilol  12.5 mg twice daily and rosuvastatin  20 mg daily.  No indication for further ischemic testing at this time as residual LAD stenosis is not optimal for PCI, and in the context of lack of anginal symptoms.  PAF: Maintaining sinus rhythm on carvedilol  12.5 mg twice daily.  CHA2DS2-VASc at least 8.  She remains on apixaban  5 mg twice daily.  Monitor weight trend as if her weight remains less than 60 kg we would need to reduce her apixaban  dose.  No symptoms concerning for bleeding.  Recent labs stable.  HTN: Blood pressure is reasonably controlled in the office today.  She remains on carvedilol  and valsartan .  HLD: LDL 54 in 02/2024.  She remains on rosuvastatin .  May need to consider the addition of Vascepa or fenofibrate in follow-up.  CVA/TIA: No new focal neurological deficits.  Remains on apixaban  as outlined above as well as rosuvastatin .     Disposition: F/u with Dr. Darron or an APP in 6 months.   Medication Adjustments/Labs and Tests Ordered: Current medicines are reviewed at length with the patient today.  Concerns regarding medicines are outlined above. Medication changes, Labs and Tests ordered today are summarized above and listed in the Patient Instructions accessible in Encounters.   SignedBernardino Bring, PA-C 06/09/2024 11:59 AM     [facility] [place] [address] [place], [place] [address] [phone]

## 2024-06-09 ENCOUNTER — Ambulatory Visit: Attending: Physician Assistant | Admitting: Physician Assistant

## 2024-06-09 ENCOUNTER — Encounter: Payer: Self-pay | Admitting: Physician Assistant

## 2024-06-09 VITALS — BP 136/80 | HR 71 | Resp 17 | Ht <= 58 in | Wt 128.8 lb

## 2024-06-09 DIAGNOSIS — I10 Essential (primary) hypertension: Secondary | ICD-10-CM | POA: Diagnosis not present

## 2024-06-09 DIAGNOSIS — I480 Paroxysmal atrial fibrillation: Secondary | ICD-10-CM

## 2024-06-09 DIAGNOSIS — G459 Transient cerebral ischemic attack, unspecified: Secondary | ICD-10-CM

## 2024-06-09 DIAGNOSIS — E785 Hyperlipidemia, unspecified: Secondary | ICD-10-CM

## 2024-06-09 DIAGNOSIS — I2510 Atherosclerotic heart disease of native coronary artery without angina pectoris: Secondary | ICD-10-CM | POA: Diagnosis not present

## 2024-06-09 DIAGNOSIS — Z8673 Personal history of transient ischemic attack (TIA), and cerebral infarction without residual deficits: Secondary | ICD-10-CM

## 2024-06-09 MED ORDER — APIXABAN 5 MG PO TABS
5.0000 mg | ORAL_TABLET | Freq: Two times a day (BID) | ORAL | 3 refills | Status: AC
Start: 2024-06-09 — End: ?

## 2024-06-09 MED ORDER — JARDIANCE 10 MG PO TABS: 10.0000 mg | ORAL_TABLET | Freq: Every day | ORAL | 3 refills | Status: AC

## 2024-06-09 MED ORDER — CARVEDILOL 12.5 MG PO TABS: 12.5000 mg | ORAL_TABLET | Freq: Two times a day (BID) | ORAL | 3 refills | Status: AC

## 2024-06-09 MED ORDER — VALSARTAN 160 MG PO TABS: 160.0000 mg | ORAL_TABLET | Freq: Every day | ORAL | 3 refills | Status: AC

## 2024-06-09 MED ORDER — ROSUVASTATIN CALCIUM 20 MG PO TABS: 20.0000 mg | ORAL_TABLET | Freq: Every day | ORAL | 3 refills | Status: AC

## 2024-06-09 NOTE — Patient Instructions (Signed)
 Medication Instructions:  Your physician recommends that you continue on your current medications as directed. Please refer to the Current Medication list given to you today.   *If you need a refill on your cardiac medications before your next appointment, please call your pharmacy*  Lab Work: None ordered at this time  If you have labs (blood work) drawn today and your tests are completely normal, you will receive your results only by: MyChart Message (if you have MyChart) OR A paper copy in the mail If you have any lab test that is abnormal or we need to change your treatment, we will call you to review the results.  Testing/Procedures: None ordered at this time   Follow-Up: At [facility], you and your health needs are our priority.  As part of our continuing mission to provide you with exceptional heart care, our providers are all part of one team.  This team includes your primary Cardiologist (physician) and Advanced Practice Providers or APPs (Physician Assistants and Nurse Practitioners) who all work together to provide you with the care you need, when you need it.  Your next appointment:   6 month(s)  Provider:   You may see Antionette Kirks, MD or one of the following Advanced Practice Providers on your designated Care Team:   Laneta Pintos, NP Gildardo Labrador, PA-C Varney Gentleman, PA-C Cadence Avalon, PA-C Ronald Cockayne, NP Morey Ar, NP    We recommend signing up for the patient portal called "MyChart".  Sign up information is provided on this After Visit Summary.  MyChart is used to connect with patients for Virtual Visits (Telemedicine).  Patients are able to view lab/test results, encounter notes, upcoming appointments, etc.  Non-urgent messages can be sent to your provider as well.   To learn more about what you can do with MyChart, go to [email or web address].

## 2024-09-30 ENCOUNTER — Encounter: Payer: Self-pay | Admitting: Ophthalmology

## 2024-09-30 NOTE — Discharge Instructions (Signed)

## 2024-09-30 NOTE — Anesthesia Preprocedure Evaluation (Addendum)
 Anesthesia Evaluation  Patient identified by MRN, date of birth, ID band Patient awake    Reviewed: Allergy & Precautions, H&P , NPO status , Patient's Chart, lab work & pertinent test results  Airway Mallampati: IV  TM Distance: <3 FB Neck ROM: Full    Dental no notable dental hx. (+) Caps No caps in front teeth:   Pulmonary neg pulmonary ROS, sleep apnea , former smoker   Pulmonary exam normal breath sounds clear to auscultation       Cardiovascular hypertension, + CAD and + Past MI  negative cardio ROS Normal cardiovascular exam Rhythm:Irregular Rate:Normal  11-20-22 echo 1. Left ventricular ejection fraction, by estimation, is 60 to 65%. The  left ventricle has normal function. The left ventricle demonstrates  regional wall motion abnormalities (see scoring diagram/findings for  description). There is mild left ventricular   hypertrophy. Left ventricular diastolic parameters are consistent with  Grade I diastolic dysfunction (impaired relaxation). Elevated left atrial  pressure. There is mild hypokinesis of the left ventricular, basal-mid  inferior wall and inferolateral wall.   2. Right ventricular systolic function is normal. The right ventricular  size is normal. Tricuspid regurgitation signal is inadequate for assessing  PA pressure.   3. The mitral valve is normal in structure. No evidence of mitral valve  regurgitation. No evidence of mitral stenosis.   4. The aortic valve is tricuspid. Aortic valve regurgitation is trivial.  No aortic stenosis is present.   11-19-22 cath 1st Mrg lesion is 100% stenosed.   Prox LAD to Mid LAD lesion is 40% stenosed.   1st Diag lesion is 60% stenosed.   Mid LAD to Dist LAD lesion is 99% stenosed.   RPDA lesion is 85% stenosed.   Prox RCA to Mid RCA lesion is 40% stenosed.   Mid RCA to Dist RCA lesion is 99% stenosed.   A drug-eluting stent was successfully placed using a  STENT ONYX FRONTIER 3.5X30.   Post intervention, there is a 0% residual stenosis.   There is moderate left ventricular systolic dysfunction.   LV end diastolic pressure is severely elevated.   The left ventricular ejection fraction is 35-45% by visual estimate.   11-19-22 cath  1.  Severe three-vessel coronary artery disease.  The culprit is a subtotal occlusion of the mid right coronary artery.  In addition, the patient has occluded OM1 with collaterals and diffuse subocclusive disease throughout the mid and distal LAD. 2.  Moderately reduced LV systolic function with severely elevated left ventricular end-diastolic pressure 35 mmHg. 3.  Successful angioplasty and drug-eluting stent placement to the mid right coronary artery. 4.  Door to device was delayed by difficult access via the right radial artery given calcified, tortuous and stenosed proximal radial artery.  This was navigated successfully with a run-through wire using balloon assisted tracking.        Neuro/Psych  PSYCHIATRIC DISORDERS Anxiety Depression    TIA Neuromuscular disease CVA negative neurological ROS  negative psych ROS   GI/Hepatic negative GI ROS, Neg liver ROS,GERD  ,,  Endo/Other  negative endocrine ROSdiabetes    Renal/GU Renal diseasenegative Renal ROS  negative genitourinary   Musculoskeletal negative musculoskeletal ROS (+) Arthritis ,    Abdominal   Peds negative pediatric ROS (+)  Hematology negative hematology ROS (+)   Anesthesia Other Findings Last ozempic 09-28-24 Accucheck 136 today  Medical History  A-fib (HCC)  Hypertension GERD (gastroesophageal reflux disease) Hypercholesteremia Depression  Coronary artery disease Myocardial infarction [facility])  Stroke (  HCC) Sleep apnea on CPAP  Chronic kidney disease Type 2 diabetes mellitus with diabetic polyneuropathy (HCC)  TIA (transient ischemic attack) Paroxysmal atrial fibrillation with rapid ventricular response  (HCC)  ST-segment elevation myocardial infarction (STEMI) of inferior wall (HCC) History of heart artery stent  Grade I diastolic dysfunction    Reproductive/Obstetrics negative OB ROS                              Anesthesia Physical Anesthesia Plan  ASA: 3  Anesthesia Plan: MAC   Post-op Pain Management:    Induction: Intravenous  PONV Risk Score and Plan:   Airway Management Planned: Natural Airway and Nasal Cannula  Additional Equipment:   Intra-op Plan:   Post-operative Plan:   Informed Consent: I have reviewed the patients History and Physical, chart, labs and discussed the procedure including the risks, benefits and alternatives for the proposed anesthesia with the patient or authorized representative who has indicated his/her understanding and acceptance.     Dental Advisory Given  Plan Discussed with: Anesthesiologist, CRNA and Surgeon  Anesthesia Plan Comments: (Patient consented for risks of anesthesia including but not limited to:  - adverse reactions to medications - damage to eyes, teeth, lips or other oral mucosa - nerve damage due to positioning  - sore throat or hoarseness - Damage to heart, brain, nerves, lungs, other parts of body or loss of life  Patient voiced understanding and assent.)        Anesthesia Quick Evaluation

## 2024-10-05 ENCOUNTER — Encounter: Payer: Self-pay | Admitting: Ophthalmology

## 2024-10-05 ENCOUNTER — Ambulatory Visit: Payer: Self-pay | Admitting: Anesthesiology

## 2024-10-05 ENCOUNTER — Ambulatory Visit
Admission: RE | Admit: 2024-10-05 | Discharge: 2024-10-05 | Disposition: A | Discharge status: Home / Self Care | Attending: Ophthalmology | Admitting: Ophthalmology

## 2024-10-05 ENCOUNTER — Encounter
Admission: RE | Disposition: A | Discharge status: Home / Self Care | Payer: Self-pay | Source: Home / Self Care | Attending: Ophthalmology

## 2024-10-05 ENCOUNTER — Other Ambulatory Visit: Payer: Self-pay

## 2024-10-05 DIAGNOSIS — Z87891 Personal history of nicotine dependence: Secondary | ICD-10-CM | POA: Insufficient documentation

## 2024-10-05 DIAGNOSIS — E1142 Type 2 diabetes mellitus with diabetic polyneuropathy: Secondary | ICD-10-CM | POA: Insufficient documentation

## 2024-10-05 DIAGNOSIS — H2512 Age-related nuclear cataract, left eye: Secondary | ICD-10-CM | POA: Insufficient documentation

## 2024-10-05 DIAGNOSIS — Z8673 Personal history of transient ischemic attack (TIA), and cerebral infarction without residual deficits: Secondary | ICD-10-CM | POA: Insufficient documentation

## 2024-10-05 DIAGNOSIS — Z7984 Long term (current) use of oral hypoglycemic drugs: Secondary | ICD-10-CM | POA: Insufficient documentation

## 2024-10-05 DIAGNOSIS — I129 Hypertensive chronic kidney disease with stage 1 through stage 4 chronic kidney disease, or unspecified chronic kidney disease: Secondary | ICD-10-CM | POA: Diagnosis not present

## 2024-10-05 DIAGNOSIS — K219 Gastro-esophageal reflux disease without esophagitis: Secondary | ICD-10-CM | POA: Insufficient documentation

## 2024-10-05 DIAGNOSIS — E1136 Type 2 diabetes mellitus with diabetic cataract: Secondary | ICD-10-CM | POA: Insufficient documentation

## 2024-10-05 DIAGNOSIS — E1122 Type 2 diabetes mellitus with diabetic chronic kidney disease: Secondary | ICD-10-CM | POA: Insufficient documentation

## 2024-10-05 DIAGNOSIS — I2510 Atherosclerotic heart disease of native coronary artery without angina pectoris: Secondary | ICD-10-CM | POA: Diagnosis not present

## 2024-10-05 DIAGNOSIS — G4733 Obstructive sleep apnea (adult) (pediatric): Secondary | ICD-10-CM | POA: Diagnosis not present

## 2024-10-05 DIAGNOSIS — F32A Depression, unspecified: Secondary | ICD-10-CM | POA: Diagnosis not present

## 2024-10-05 DIAGNOSIS — N189 Chronic kidney disease, unspecified: Secondary | ICD-10-CM | POA: Diagnosis not present

## 2024-10-05 DIAGNOSIS — F419 Anxiety disorder, unspecified: Secondary | ICD-10-CM | POA: Insufficient documentation

## 2024-10-05 DIAGNOSIS — I252 Old myocardial infarction: Secondary | ICD-10-CM | POA: Insufficient documentation

## 2024-10-05 HISTORY — DX: Presence of coronary angioplasty implant and graft: Z95.5

## 2024-10-05 HISTORY — DX: Paroxysmal atrial fibrillation: I48.0

## 2024-10-05 HISTORY — DX: Obstructive sleep apnea (adult) (pediatric): G47.33

## 2024-10-05 HISTORY — DX: Type 2 diabetes mellitus with diabetic polyneuropathy: E11.42

## 2024-10-05 HISTORY — DX: Chronic kidney disease, unspecified: N18.9

## 2024-10-05 HISTORY — DX: Cerebral infarction, unspecified: I63.9

## 2024-10-05 HISTORY — DX: Acute myocardial infarction, unspecified: I21.9

## 2024-10-05 HISTORY — DX: Other ill-defined heart diseases: I51.89

## 2024-10-05 HISTORY — PX: CATARACT EXTRACTION W/PHACO: SHX586

## 2024-10-05 HISTORY — DX: Atherosclerotic heart disease of native coronary artery without angina pectoris: I25.10

## 2024-10-05 HISTORY — DX: Sleep apnea, unspecified: G47.30

## 2024-10-05 LAB — GLUCOSE, CAPILLARY: Glucose-Capillary: 136 mg/dL — ABNORMAL HIGH (ref 70–99)

## 2024-10-05 SURGERY — PHACOEMULSIFICATION, CATARACT, WITH IOL INSERTION
Anesthesia: Monitor Anesthesia Care | Site: Eye | Laterality: Left

## 2024-10-05 MED ORDER — EPINEPHRINE PF 1 MG/ML IJ SOLN
INTRAMUSCULAR | Status: DC | PRN
  Administered 2024-10-05: 111 mL via OPHTHALMIC

## 2024-10-05 MED ORDER — SIGHTPATH DOSE#1 NA HYALUR & NA CHOND-NA HYALUR IO KIT
PACK | INTRAOCULAR | Status: DC | PRN
Start: 2024-10-05 — End: 2024-10-05
  Administered 2024-10-05: 1 via OPHTHALMIC

## 2024-10-05 MED ORDER — MOXIFLOXACIN HCL 0.5 % OP SOLN
OPHTHALMIC | Status: DC | PRN
  Administered 2024-10-05: .2 mL via OPHTHALMIC

## 2024-10-05 MED ORDER — LIDOCAINE HCL (PF) 2 % IJ SOLN
INTRAOCULAR | Status: DC | PRN
  Administered 2024-10-05: 4 mL via INTRAOCULAR

## 2024-10-05 MED ORDER — MIDAZOLAM HCL 2 MG/2ML IJ SOLN
INTRAMUSCULAR | Status: AC
  Filled 2024-10-05: qty 2

## 2024-10-05 MED ORDER — FENTANYL CITRATE (PF) 100 MCG/2ML IJ SOLN
INTRAMUSCULAR | Status: DC | PRN
  Administered 2024-10-05: 50 ug via INTRAVENOUS

## 2024-10-05 MED ORDER — TETRACAINE HCL 0.5 % OP SOLN
OPHTHALMIC | Status: AC
  Filled 2024-10-05: qty 4

## 2024-10-05 MED ORDER — MIDAZOLAM HCL (PF) 2 MG/2ML IJ SOLN
INTRAMUSCULAR | Status: DC | PRN
  Administered 2024-10-05: .5 mg via INTRAVENOUS

## 2024-10-05 MED ORDER — FENTANYL CITRATE (PF) 100 MCG/2ML IJ SOLN
INTRAMUSCULAR | Status: AC
  Filled 2024-10-05: qty 2

## 2024-10-05 MED ORDER — TETRACAINE HCL 0.5 % OP SOLN
1.0000 [drp] | OPHTHALMIC | Status: DC | PRN
  Administered 2024-10-05 (×3): 1 [drp] via OPHTHALMIC

## 2024-10-05 MED ORDER — ARMC OPHTHALMIC DILATING DROPS
1.0000 | OPHTHALMIC | Status: DC | PRN
  Administered 2024-10-05 (×3): 1 via OPHTHALMIC

## 2024-10-05 MED ORDER — SIGHTPATH DOSE#1 BSS IO SOLN
INTRAOCULAR | Status: DC | PRN
  Administered 2024-10-05: 15 mL via INTRAOCULAR

## 2024-10-05 MED ORDER — LACTATED RINGERS IV SOLN: INTRAVENOUS | Status: DC

## 2024-10-05 MED ORDER — ARMC OPHTHALMIC DILATING DROPS
OPHTHALMIC | Status: AC
  Filled 2024-10-05: qty 0.5

## 2024-10-05 SURGICAL SUPPLY — 9 items
DISSECTOR HYDRO NUCLEUS 50X22 (MISCELLANEOUS) ×1 IMPLANT
FEE CATARACT SUITE SIGHTPATH (MISCELLANEOUS) ×1 IMPLANT
GLOVE PI ULTRA LF STRL 7.5 (GLOVE) ×1 IMPLANT
GLOVE SURG SYN 6.5 PF PI BL (GLOVE) ×1 IMPLANT
GLOVE SURG SYN 8.5 PF PI BL (GLOVE) ×1 IMPLANT
LENS IOL TECNIS EYHANCE 22.0 (Intraocular Lens) IMPLANT
NDL FILTER BLUNT 18X1 1/2 (NEEDLE) ×1 IMPLANT
NEEDLE FILTER BLUNT 18X1 1/2 (NEEDLE) ×1 IMPLANT
SYR 3ML LL SCALE MARK (SYRINGE) ×1 IMPLANT

## 2024-10-05 NOTE — Anesthesia Postprocedure Evaluation (Signed)
 Anesthesia Post Note  Patient: Vanessa Young  Procedure(s) Performed: PHACOEMULSIFICATION, CATARACT, WITH IOL INSERTION 3.52 00:39.4 (Left: Eye)  Patient location during evaluation: PACU Anesthesia Type: MAC Level of consciousness: awake and alert Pain management: pain level controlled Vital Signs Assessment: post-procedure vital signs reviewed and stable Respiratory status: spontaneous breathing, nonlabored ventilation, respiratory function stable and patient connected to nasal cannula oxygen Cardiovascular status: stable and blood pressure returned to baseline Postop Assessment: no apparent nausea or vomiting Anesthetic complications: no   No notable events documented.   Last Vitals:  Vitals:   10/05/24 0815 10/05/24 0821  BP: (!) 174/97 (!) 183/84  Pulse: 75 71  Resp: 18 17  Temp: (!) 36.2 C (!) 36.2 C  SpO2: 97% 96%    Last Pain:  Vitals:   10/05/24 0821  TempSrc:   PainSc: 0-No pain                 Calisa Luckenbaugh C Erin Obando

## 2024-10-05 NOTE — Transfer of Care (Signed)
 Immediate Anesthesia Transfer of Care Note  Patient: Vanessa Young  Procedure(s) Performed: PHACOEMULSIFICATION, CATARACT, WITH IOL INSERTION 3.52 00:39.4 (Left: Eye)  Patient Location: PACU  Anesthesia Type: MAC  Level of Consciousness: awake, alert  and patient cooperative  Airway and Oxygen Therapy: Patient Spontanous Breathing and Patient connected to supplemental oxygen  Post-op Assessment: Post-op Vital signs reviewed, Patient's Cardiovascular Status Stable, Respiratory Function Stable, Patent Airway and No signs of Nausea or vomiting  Post-op Vital Signs: Reviewed and stable  Complications: No notable events documented.

## 2024-10-05 NOTE — H&P (Signed)
 [facility]   Primary Care Physician:  Calista Birch Farino, MD Ophthalmologist: Dr. Adine Novak  Pre-Procedure History & Physical: HPI:  Vanessa Young is a 83 y.o. female here for cataract surgery.   Past Medical History:  Diagnosis Date   A-fib [facility])    Chronic kidney disease    Coronary artery disease    Depression    GERD (gastroesophageal reflux disease)    Grade I diastolic dysfunction    History of heart artery stent 11/2022   Hypercholesteremia    Hypertension    Myocardial infarction [facility])    2023 with stent placement   OSA on CPAP    Paroxysmal atrial fibrillation with rapid ventricular response (HCC)    S/P drug eluting coronary stent placement    Sleep apnea    uses cpap   ST-segment elevation myocardial infarction (STEMI) of inferior wall (HCC) 11/19/2022   Stroke (HCC)    2023   TIA (transient ischemic attack) 11/2022   Type 2 diabetes mellitus with diabetic polyneuropathy (HCC)     Past Surgical History:  Procedure Laterality Date   CORONARY/GRAFT ACUTE MI REVASCULARIZATION N/A 11/19/2022   Procedure: Coronary/Graft Acute MI Revascularization;  Surgeon: Darron Deatrice LABOR, MD;  Location: [facility] INVASIVE CV LAB;  Service: Cardiovascular;  Laterality: N/A;   CYSTOSCOPY     LEFT HEART CATH AND CORONARY ANGIOGRAPHY N/A 11/19/2022   Procedure: LEFT HEART CATH AND CORONARY ANGIOGRAPHY;  Surgeon: Darron Deatrice LABOR, MD;  Location: [facility] INVASIVE CV LAB;  Service: Cardiovascular;  Laterality: N/A;    Prior to Admission medications   Medication Sig Start Date End Date Taking? Authorizing Provider  apixaban  (ELIQUIS ) 5 MG TABS tablet Take 1 tablet (5 mg total) by mouth 2 (two) times daily. 06/09/24  Yes Dunn, Bernardino HERO, PA-C  carvedilol  (COREG ) 12.5 MG tablet Take 1 tablet (12.5 mg total) by mouth 2 (two) times daily. 06/09/24  Yes Dunn, Bernardino HERO, PA-C  chlorthalidone  (HYGROTON ) 25 MG tablet Take 12.5 mg by mouth daily.   Yes [provider]  Cholecalciferol (D3) 25 MCG  (1000 UT) capsule Take 1,000 Units by mouth daily.   Yes [provider]  cyanocobalamin (VITAMIN B12) 1000 MCG tablet Take 1,000 mcg by mouth daily.   Yes [provider]  escitalopram  (LEXAPRO ) 5 MG tablet Take 5 mg by mouth daily. 08/29/22  Yes [provider]  JARDIANCE  10 MG TABS tablet Take 1 tablet (10 mg total) by mouth daily. 06/09/24  Yes Dunn, Bernardino HERO, PA-C  magnesium  oxide (MAG-OX) 400 (240 Mg) MG tablet Take 1 tablet by mouth daily. 05/16/21  Yes [provider]  pantoprazole  (PROTONIX ) 40 MG tablet Take 1 tablet (40 mg total) by mouth daily. 05/29/24 05/29/25 Yes Dunn, Bernardino HERO, PA-C  rosuvastatin  (CRESTOR ) 20 MG tablet Take 1 tablet (20 mg total) by mouth daily. 06/09/24  Yes Dunn, Bernardino HERO, PA-C  valsartan  (DIOVAN ) 160 MG tablet Take 1 tablet (160 mg total) by mouth daily. 06/09/24  Yes Abigail Bernardino HERO, PA-C  ACCU-CHEK GUIDE test strip  01/03/22   [provider]    Allergies as of 09/21/2024 - Review Complete 06/09/2024  Allergen Reaction Noted   Keflex [cephalexin] Hives 05/22/2015   Hydrochlorothiazide-triamterene Nausea Only 09/13/2015   Lisinopril Other (See Comments) 03/10/2016   Penicillins Hives 05/22/2015   Statins Other (See Comments) 03/10/2016    Family History  Problem Relation Age of Onset   CVA Mother    Diabetes Mother    CVA  Father     Social History   Socioeconomic History   Marital status: Widowed    Spouse name: Not on file   Number of children: Not on file   Years of education: Not on file   Highest education level: Not on file  Occupational History   Not on file  Tobacco Use   Smoking status: Former    Current packs/day: 0.00    Average packs/day: 0.3 packs/day for 20.0 years (5.0 ttl pk-yrs)    Types: Cigarettes    Start date: 33    Quit date: 2002    Years since quitting: 23.8   Smokeless tobacco: Never  Vaping Use   Vaping status: Never Used  Substance and Sexual Activity   Alcohol use: No   Drug  use: Never   Sexual activity: Not on file  Other Topics Concern   Not on file  Social History Narrative   Not on file   Social Drivers of Health   Financial Resource Strain: Low Risk  (03/06/2024)   Received from [facility]   Overall Financial Resource Strain (CARDIA)    Difficulty of Paying Living Expenses: Not hard at all  Food Insecurity: No Food Insecurity (03/06/2024)   Received from [facility]   Hunger Vital Sign    Within the past 12 months, you worried that your food would run out before you got the money to buy more.: Never true    Within the past 12 months, the food you bought just didn't last and you didn't have money to get more.: Never true  Transportation Needs: No Transportation Needs (03/06/2024)   Received from [facility] - Transportation    In the past 12 months, has lack of transportation kept you from medical appointments or from getting medications?: No    Lack of Transportation (Non-Medical): No  Physical Activity: Not on file  Stress: Not on file  Social Connections: Not on file  Intimate Partner Violence: Not At Risk (11/19/2022)   Humiliation, Afraid, Rape, and Kick questionnaire    Fear of Current or Ex-Partner: No    Emotionally Abused: No    Physically Abused: No    Sexually Abused: No    Review of Systems: See HPI, otherwise negative ROS  Physical Exam: BP (!) 200/97   Temp (!) 97.5 F (36.4 C) (Temporal)   Resp 15   Ht 4' 9.01 (1.448 m)   Wt 59 kg   SpO2 98%   BMI 28.12 kg/m  General:   Alert, cooperative. Head:  Normocephalic and atraumatic. Respiratory:  Normal work of breathing. Cardiovascular:  NAD  Impression/Plan: Vanessa Young is here for cataract surgery.  Risks, benefits, limitations, and alternatives regarding cataract surgery have been reviewed with the patient.  Questions have been answered.  All parties agreeable.   Adine Novak, MD  10/05/2024, 7:47  AM

## 2024-10-05 NOTE — Op Note (Signed)
 OPERATIVE NOTE  HATLEY HENEGAR [id number] 10/05/2024   PREOPERATIVE DIAGNOSIS:  Nuclear sclerotic cataract left eye.  H25.12   POSTOPERATIVE DIAGNOSIS:    Nuclear sclerotic cataract left eye.     PROCEDURE:  Phacoemusification with posterior chamber intraocular lens placement of the left eye   LENS:   Implant Name Type Inv. Item Serial No. Manufacturer Lot No. LRB No. Used Action  LENS IOL TECNIS EYHANCE 22.0 - [id number] Intraocular Lens LENS IOL TECNIS EYHANCE 22.0 [id number] [employer]  Left 1 Implanted      Procedure(s): PHACOEMULSIFICATION, CATARACT, WITH IOL INSERTION 3.52 00:39.4 (Left)  SURGEON:  Adine Novak, MD, MPH   ANESTHESIA:  Topical with tetracaine drops augmented with 1% preservative-free intracameral lidocaine .  ESTIMATED BLOOD LOSS: <1 mL   COMPLICATIONS:  None.   DESCRIPTION OF PROCEDURE:  The patient was identified in the holding room and transported to the operating room and placed in the supine position under the operating microscope.  The left eye was identified as the operative eye and it was prepped and draped in the usual sterile ophthalmic fashion.   A 1.0 millimeter clear-corneal paracentesis was made at the 5:00 position. 0.5 ml of preservative-free 1% lidocaine  with epinephrine  was injected into the anterior chamber.  The anterior chamber was filled with viscoelastic.  A 2.4 millimeter keratome was used to make a near-clear corneal incision at the 2:00 position.  A curvilinear capsulorrhexis was made with a cystotome and capsulorrhexis forceps.  Balanced salt solution was used to hydrodissect and hydrodelineate the nucleus.   Phacoemulsification was then used in stop and chop fashion to remove the lens nucleus and epinucleus.  The remaining cortex was then removed using the irrigation and aspiration handpiece. Viscoelastic was then placed into the capsular bag to distend it for lens placement.  A lens was then injected into the capsular bag.  The remaining  viscoelastic was aspirated.   Wounds were hydrated with balanced salt solution.  The anterior chamber was inflated to a physiologic pressure with balanced salt solution.  Intracameral vigamox 0.1 mL undiltued was injected into the eye and a drop placed onto the ocular surface.  No wound leaks were noted.  The patient was taken to the recovery room in stable condition without complications of anesthesia or surgery  Adine Novak 10/05/2024, 8:13 AM

## 2024-10-09 NOTE — Anesthesia Preprocedure Evaluation (Addendum)
 Anesthesia Evaluation  Patient identified by MRN, date of birth, ID band Patient awake    Reviewed: Allergy & Precautions, H&P , NPO status , Patient's Chart, lab work & pertinent test results  Airway Mallampati: IV  TM Distance: <3 FB Neck ROM: Full    Dental no notable dental hx. (+) Caps No caps on front teeth:   Pulmonary neg pulmonary ROS, sleep apnea , former smoker   Pulmonary exam normal breath sounds clear to auscultation       Cardiovascular hypertension, + CAD and + Past MI  + dysrhythmias Atrial Fibrillation  Rhythm:Irregular Rate:Normal  Note: hx A fib  11-20-22 echo 1. Left ventricular ejection fraction, by estimation, is 60 to 65%. The  left ventricle has normal function. The left ventricle demonstrates  regional wall motion abnormalities (see scoring diagram/findings for  description). There is mild left ventricular   hypertrophy. Left ventricular diastolic parameters are consistent with  Grade I diastolic dysfunction (impaired relaxation). Elevated left atrial  pressure. There is mild hypokinesis of the left ventricular, basal-mid  inferior wall and inferolateral wall.   2. Right ventricular systolic function is normal. The right ventricular  size is normal. Tricuspid regurgitation signal is inadequate for assessing  PA pressure.   3. The mitral valve is normal in structure. No evidence of mitral valve  regurgitation. No evidence of mitral stenosis.   4. The aortic valve is tricuspid. Aortic valve regurgitation is trivial.  No aortic stenosis is present.    11-19-22 cath 1st Mrg lesion is 100% stenosed.   Prox LAD to Mid LAD lesion is 40% stenosed.   1st Diag lesion is 60% stenosed.   Mid LAD to Dist LAD lesion is 99% stenosed.   RPDA lesion is 85% stenosed.   Prox RCA to Mid RCA lesion is 40% stenosed.   Mid RCA to Dist RCA lesion is 99% stenosed.   A drug-eluting stent was successfully placed  using a STENT ONYX FRONTIER 3.5X30.   Post intervention, there is a 0% residual stenosis.   There is moderate left ventricular systolic dysfunction.   LV end diastolic pressure is severely elevated.   The left ventricular ejection fraction is 35-45% by visual estimate.    11-19-22 cath   1.  Severe three-vessel coronary artery disease.  The culprit is a subtotal occlusion of the mid right coronary artery.  In addition, the patient has occluded OM1 with collaterals and diffuse subocclusive disease throughout the mid and distal LAD. 2.  Moderately reduced LV systolic function with severely elevated left ventricular end-diastolic pressure 35 mmHg. 3.  Successful angioplasty and drug-eluting stent placement to the mid right coronary artery. 4.  Door to device was delayed by difficult access via the right radial artery given calcified, tortuous and stenosed proximal radial artery.  This was navigated successfully with a run-through wire using balloon assisted tracking.          Neuro/Psych  PSYCHIATRIC DISORDERS Anxiety Depression    TIA Neuromuscular disease CVA negative neurological ROS  negative psych ROS   GI/Hepatic negative GI ROS, Neg liver ROS,GERD  ,,  Endo/Other  negative endocrine ROSdiabetes    Renal/GU      Musculoskeletal   Abdominal   Peds  Hematology negative hematology ROS (+)   Anesthesia Other Findings Previous cataract surgery 10-05-24 Dr. Ola  A-fib [facility]) Hypertension GERD (gastroesophageal reflux disease) Hypercholesteremia Depression Coronary artery disease Myocardial infarction [facility]) Stroke (HCC) Sleep apnea Chronic kidney disease Type 2 diabetes mellitus with diabetic  polyneuropathy (HCC) TIA (transient ischemic attack) Paroxysmal atrial fibrillation with rapid ventricular response (HCC) ST-segment elevation myocardial infarction (STEMI) of inferior wall (HCC) History of heart artery stent Grade I diastolic dysfunction S/P drug eluting  coronary stent placement OSA on CPAP    Reproductive/Obstetrics negative OB ROS                              Anesthesia Physical Anesthesia Plan  ASA: 3  Anesthesia Plan: MAC   Post-op Pain Management: Minimal or no pain anticipated   Induction: Intravenous  PONV Risk Score and Plan: 2 and Propofol infusion and TIVA  Airway Management Planned: Natural Airway and Nasal Cannula  Additional Equipment: None  Intra-op Plan:   Post-operative Plan:   Informed Consent: I have reviewed the patients History and Physical, chart, labs and discussed the procedure including the risks, benefits and alternatives for the proposed anesthesia with the patient or authorized representative who has indicated his/her understanding and acceptance.     Dental Advisory Given  Plan Discussed with: Anesthesiologist, CRNA and Surgeon  Anesthesia Plan Comments: (Patient consented for risks of anesthesia including but not limited to:  - adverse reactions to medications - damage to eyes, teeth, lips or other oral mucosa - nerve damage due to positioning  - sore throat or hoarseness - Damage to heart, brain, nerves, lungs, other parts of body or loss of life  Patient voiced understanding and assent.)         Anesthesia Quick Evaluation

## 2024-10-13 ENCOUNTER — Ambulatory Visit

## 2024-10-13 ENCOUNTER — Ambulatory Visit: Admitting: Podiatry

## 2024-10-13 DIAGNOSIS — S9031XA Contusion of right foot, initial encounter: Secondary | ICD-10-CM | POA: Diagnosis not present

## 2024-10-13 DIAGNOSIS — M79671 Pain in right foot: Secondary | ICD-10-CM | POA: Diagnosis not present

## 2024-10-13 NOTE — Progress Notes (Signed)
 Subjective:  Patient ID: Vanessa Young, female    DOB: [birth year],  MRN: [id number]  Chief Complaint  Patient presents with   Foot Injury    Right foot injury pt stated that she dropped a brick on her foot a week ago the top of her foot is bruised and discolored she stated that she does not have any pain she is a diabetic and is on blood thinners     83 y.o. female presents with the above complaint.  Patient presents with complaint of soft tissue contusion to the right lateral dorsal foot.  She wanted to get it evaluated has not seen MRIs prior to seeing me she is a diabetic she is currently on blood thinners as well.  She states that she just wanted get evaluated is not causing her any pain or discomfort.  She wants to make sure she did not break anything.  There is some ecchymosis which seems to be resolving   Review of Systems: Negative except as noted in the HPI. Denies N/V/F/Ch.  Past Medical History:  Diagnosis Date   A-fib [facility])    Chronic kidney disease    Coronary artery disease    Depression    GERD (gastroesophageal reflux disease)    Grade I diastolic dysfunction    History of heart artery stent 11/2022   Hypercholesteremia    Hypertension    Myocardial infarction [facility])    2023 with stent placement   OSA on CPAP    Paroxysmal atrial fibrillation with rapid ventricular response (HCC)    S/P drug eluting coronary stent placement    Sleep apnea    uses cpap   ST-segment elevation myocardial infarction (STEMI) of inferior wall (HCC) 11/19/2022   Stroke (HCC)    2023   TIA (transient ischemic attack) 11/2022   Type 2 diabetes mellitus with diabetic polyneuropathy (HCC)     Current Outpatient Medications:    ACCU-CHEK GUIDE test strip, , Disp: , Rfl:    apixaban  (ELIQUIS ) 5 MG TABS tablet, Take 1 tablet (5 mg total) by mouth 2 (two) times daily., Disp: 180 tablet, Rfl: 3   carvedilol  (COREG ) 12.5 MG tablet, Take 1 tablet (12.5 mg total) by mouth 2 (two) times daily.,  Disp: 180 tablet, Rfl: 3   chlorthalidone  (HYGROTON ) 25 MG tablet, Take 12.5 mg by mouth daily., Disp: , Rfl:    Cholecalciferol (D3) 25 MCG (1000 UT) capsule, Take 1,000 Units by mouth daily., Disp: , Rfl:    cyanocobalamin (VITAMIN B12) 1000 MCG tablet, Take 1,000 mcg by mouth daily., Disp: , Rfl:    escitalopram  (LEXAPRO ) 5 MG tablet, Take 5 mg by mouth daily., Disp: , Rfl:    JARDIANCE  10 MG TABS tablet, Take 1 tablet (10 mg total) by mouth daily., Disp: 90 tablet, Rfl: 3   magnesium  oxide (MAG-OX) 400 (240 Mg) MG tablet, Take 1 tablet by mouth daily., Disp: , Rfl:    pantoprazole  (PROTONIX ) 40 MG tablet, Take 1 tablet (40 mg total) by mouth daily., Disp: 90 tablet, Rfl: 0   rosuvastatin  (CRESTOR ) 20 MG tablet, Take 1 tablet (20 mg total) by mouth daily., Disp: 90 tablet, Rfl: 3   valsartan  (DIOVAN ) 160 MG tablet, Take 1 tablet (160 mg total) by mouth daily., Disp: 90 tablet, Rfl: 3  Social History   Tobacco Use  Smoking Status Former   Current packs/day: 0.00   Average packs/day: 0.3 packs/day for 20.0 years (5.0 ttl pk-yrs)   Types: Cigarettes   Start  date: 65   Quit date: 2002   Years since quitting: 23.8  Smokeless Tobacco Never    Allergies  Allergen Reactions   Keflex [Cephalexin] Hives   Hydrochlorothiazide-Triamterene Nausea Only   Lisinopril Other (See Comments)    Unknown     Penicillins Hives   Statins Other (See Comments)    Unknown    Objective:  There were no vitals filed for this visit. There is no height or weight on file to calculate BMI. Constitutional Well developed. Well nourished.  Vascular Dorsalis pedis pulses palpable bilaterally. Posterior tibial pulses palpable bilaterally. Capillary refill normal to all digits.  No cyanosis or clubbing noted. Pedal hair growth normal.  Neurologic Normal speech. Oriented to person, place, and time. Epicritic sensation to light touch grossly present bilaterally.  Dermatologic Nails well groomed and normal  in appearance. No open wounds. No skin lesions.  Orthopedic: No pain on palpation to the right dorsal forefoot some swelling noted.  No signs of hematoma noted.  No signs of clinical signs of infection or open wounds noted.   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  No other abnormalities noted Lisfranc interval is intact Assessment:   1. Contusion of right foot, initial encounter    Plan:  Patient was evaluated and treated and all questions answered.  Right dorsal lateral forefoot soft tissue contusion without pain - All questions and concerns were discussed with the patient in extensive detail at this time I do not appreciate any clinical signs of infection.  She may develop a hematoma as she is currently on blood thinner I discussed with her that if he gets more painful more red and more swollen she will come back and see me.  For now we will continue to clinically monitor - I encouraged her to wear surgical shoe however patient states that she would like to wear her regular shoes.

## 2024-10-14 ENCOUNTER — Encounter: Payer: Self-pay | Admitting: Ophthalmology

## 2024-10-15 NOTE — Discharge Instructions (Signed)

## 2024-10-19 ENCOUNTER — Other Ambulatory Visit: Payer: Self-pay

## 2024-10-19 ENCOUNTER — Encounter
Admission: RE | Disposition: A | Discharge status: Home / Self Care | Payer: Self-pay | Source: Home / Self Care | Attending: Ophthalmology

## 2024-10-19 ENCOUNTER — Ambulatory Visit: Payer: Self-pay | Admitting: Anesthesiology

## 2024-10-19 ENCOUNTER — Encounter: Payer: Self-pay | Admitting: Ophthalmology

## 2024-10-19 ENCOUNTER — Ambulatory Visit
Admission: RE | Admit: 2024-10-19 | Discharge: 2024-10-19 | Disposition: A | Discharge status: Home / Self Care | Attending: Ophthalmology | Admitting: Ophthalmology

## 2024-10-19 DIAGNOSIS — N189 Chronic kidney disease, unspecified: Secondary | ICD-10-CM | POA: Insufficient documentation

## 2024-10-19 DIAGNOSIS — F32A Depression, unspecified: Secondary | ICD-10-CM | POA: Diagnosis not present

## 2024-10-19 DIAGNOSIS — I480 Paroxysmal atrial fibrillation: Secondary | ICD-10-CM | POA: Diagnosis not present

## 2024-10-19 DIAGNOSIS — E1136 Type 2 diabetes mellitus with diabetic cataract: Secondary | ICD-10-CM | POA: Diagnosis not present

## 2024-10-19 DIAGNOSIS — Z8673 Personal history of transient ischemic attack (TIA), and cerebral infarction without residual deficits: Secondary | ICD-10-CM | POA: Insufficient documentation

## 2024-10-19 DIAGNOSIS — E1122 Type 2 diabetes mellitus with diabetic chronic kidney disease: Secondary | ICD-10-CM | POA: Diagnosis not present

## 2024-10-19 DIAGNOSIS — K219 Gastro-esophageal reflux disease without esophagitis: Secondary | ICD-10-CM | POA: Diagnosis not present

## 2024-10-19 DIAGNOSIS — E1142 Type 2 diabetes mellitus with diabetic polyneuropathy: Secondary | ICD-10-CM | POA: Insufficient documentation

## 2024-10-19 DIAGNOSIS — H2511 Age-related nuclear cataract, right eye: Secondary | ICD-10-CM | POA: Insufficient documentation

## 2024-10-19 DIAGNOSIS — I252 Old myocardial infarction: Secondary | ICD-10-CM | POA: Insufficient documentation

## 2024-10-19 DIAGNOSIS — Z87891 Personal history of nicotine dependence: Secondary | ICD-10-CM | POA: Insufficient documentation

## 2024-10-19 DIAGNOSIS — F419 Anxiety disorder, unspecified: Secondary | ICD-10-CM | POA: Diagnosis not present

## 2024-10-19 DIAGNOSIS — I2510 Atherosclerotic heart disease of native coronary artery without angina pectoris: Secondary | ICD-10-CM | POA: Diagnosis not present

## 2024-10-19 DIAGNOSIS — I129 Hypertensive chronic kidney disease with stage 1 through stage 4 chronic kidney disease, or unspecified chronic kidney disease: Secondary | ICD-10-CM | POA: Diagnosis not present

## 2024-10-19 DIAGNOSIS — G4733 Obstructive sleep apnea (adult) (pediatric): Secondary | ICD-10-CM | POA: Insufficient documentation

## 2024-10-19 HISTORY — PX: CATARACT EXTRACTION W/PHACO: SHX586

## 2024-10-19 LAB — GLUCOSE, CAPILLARY: Glucose-Capillary: 144 mg/dL — ABNORMAL HIGH (ref 70–99)

## 2024-10-19 SURGERY — PHACOEMULSIFICATION, CATARACT, WITH IOL INSERTION
Anesthesia: Monitor Anesthesia Care | Laterality: Right

## 2024-10-19 MED ORDER — LIDOCAINE HCL (PF) 2 % IJ SOLN
INTRAMUSCULAR | Status: DC | PRN
  Administered 2024-10-19: 1 mL via INTRAOCULAR

## 2024-10-19 MED ORDER — CYCLOPENTOLATE HCL 2 % OP SOLN
1.0000 [drp] | OPHTHALMIC | Status: AC
  Administered 2024-10-19 (×3): 1 [drp] via OPHTHALMIC

## 2024-10-19 MED ORDER — SIGHTPATH DOSE#1 NA HYALUR & NA CHOND-NA HYALUR IO KIT
PACK | INTRAOCULAR | Status: DC | PRN
  Administered 2024-10-19: 1 via OPHTHALMIC

## 2024-10-19 MED ORDER — FENTANYL CITRATE (PF) 100 MCG/2ML IJ SOLN
INTRAMUSCULAR | Status: DC | PRN
  Administered 2024-10-19: 25 ug via INTRAVENOUS

## 2024-10-19 MED ORDER — LACTATED RINGERS IV SOLN: INTRAVENOUS | Status: DC

## 2024-10-19 MED ORDER — MIDAZOLAM HCL (PF) 2 MG/2ML IJ SOLN
INTRAMUSCULAR | Status: DC | PRN
  Administered 2024-10-19: 1 mg via INTRAVENOUS

## 2024-10-19 MED ORDER — FENTANYL CITRATE (PF) 100 MCG/2ML IJ SOLN
INTRAMUSCULAR | Status: AC
  Filled 2024-10-19: qty 2

## 2024-10-19 MED ORDER — SIGHTPATH DOSE#1 BSS IO SOLN
INTRAOCULAR | Status: DC | PRN
  Administered 2024-10-19: 97 mL via OPHTHALMIC

## 2024-10-19 MED ORDER — PHENYLEPHRINE HCL 10 % OP SOLN
1.0000 [drp] | OPHTHALMIC | Status: AC
  Administered 2024-10-19 (×3): 1 [drp] via OPHTHALMIC

## 2024-10-19 MED ORDER — TETRACAINE HCL 0.5 % OP SOLN
OPHTHALMIC | Status: AC
  Filled 2024-10-19: qty 4

## 2024-10-19 MED ORDER — MIDAZOLAM HCL 2 MG/2ML IJ SOLN
INTRAMUSCULAR | Status: AC
  Filled 2024-10-19: qty 2

## 2024-10-19 MED ORDER — SIGHTPATH DOSE#1 BSS IO SOLN
INTRAOCULAR | Status: DC | PRN
  Administered 2024-10-19: 15 mL via INTRAOCULAR

## 2024-10-19 MED ORDER — TETRACAINE HCL 0.5 % OP SOLN
1.0000 [drp] | OPHTHALMIC | Status: DC | PRN
  Administered 2024-10-19 (×3): 1 [drp] via OPHTHALMIC

## 2024-10-19 MED ORDER — PHENYLEPHRINE HCL 10 % OP SOLN
OPHTHALMIC | Status: AC
  Filled 2024-10-19: qty 5

## 2024-10-19 MED ORDER — MOXIFLOXACIN HCL 0.5 % OP SOLN
OPHTHALMIC | Status: DC | PRN
  Administered 2024-10-19: .2 mL via OPHTHALMIC

## 2024-10-19 MED ORDER — CYCLOPENTOLATE HCL 2 % OP SOLN
OPHTHALMIC | Status: AC
  Filled 2024-10-19: qty 2

## 2024-10-19 SURGICAL SUPPLY — 9 items
DISSECTOR HYDRO NUCLEUS 50X22 (MISCELLANEOUS) ×1 IMPLANT
FEE CATARACT SUITE SIGHTPATH (MISCELLANEOUS) ×1 IMPLANT
GLOVE PI ULTRA LF STRL 7.5 (GLOVE) ×1 IMPLANT
GLOVE SURG SYN 6.5 PF PI BL (GLOVE) ×1 IMPLANT
GLOVE SURG SYN 8.5 PF PI BL (GLOVE) ×1 IMPLANT
LENS IOL TECNIS EYHANCE 22.5 (Intraocular Lens) IMPLANT
NDL FILTER BLUNT 18X1 1/2 (NEEDLE) ×1 IMPLANT
NEEDLE FILTER BLUNT 18X1 1/2 (NEEDLE) ×1 IMPLANT
SYR 3ML LL SCALE MARK (SYRINGE) ×1 IMPLANT

## 2024-10-19 NOTE — Anesthesia Postprocedure Evaluation (Signed)
 Anesthesia Post Note  Patient: Vanessa Young  Procedure(s) Performed: PHACOEMULSIFICATION, CATARACT, WITH IOL INSERTION 5.70, 00:49.9 (Right)  Patient location during evaluation: PACU Anesthesia Type: MAC Level of consciousness: awake and alert Pain management: pain level controlled Vital Signs Assessment: post-procedure vital signs reviewed and stable Respiratory status: spontaneous breathing, nonlabored ventilation, respiratory function stable and patient connected to nasal cannula oxygen Cardiovascular status: stable and blood pressure returned to baseline Postop Assessment: no apparent nausea or vomiting Anesthetic complications: no   No notable events documented.   Last Vitals:  Vitals:   10/19/24 0751 10/19/24 0758  BP: 138/71 (!) 140/84  Pulse: 67 69  Resp: (!) 22 20  Temp: 36.7 C 36.7 C  SpO2: 98% 98%    Last Pain:  Vitals:   10/19/24 0758  TempSrc:   PainSc: 0-No pain                 Debby Mines

## 2024-10-19 NOTE — H&P (Signed)
 [facility]   Primary Care Physician:  Calista Taquisha Phung, MD Ophthalmologist: Dr. Adine Novak  Pre-Procedure History & Physical: HPI:  KEMIA Young is a 83 y.o. female here for cataract surgery.   Past Medical History:  Diagnosis Date   A-fib [facility])    Chronic kidney disease    Coronary artery disease    Depression    GERD (gastroesophageal reflux disease)    Grade I diastolic dysfunction    History of heart artery stent 11/2022   Hypercholesteremia    Hypertension    Myocardial infarction [facility])    2023 with stent placement   OSA on CPAP    Paroxysmal atrial fibrillation with rapid ventricular response (HCC)    S/P drug eluting coronary stent placement    Sleep apnea    uses cpap   ST-segment elevation myocardial infarction (STEMI) of inferior wall (HCC) 11/19/2022   Stroke (HCC)    2023   TIA (transient ischemic attack) 11/2022   Type 2 diabetes mellitus with diabetic polyneuropathy [facility])     Past Surgical History:  Procedure Laterality Date   CATARACT EXTRACTION W/PHACO Left 10/05/2024   Procedure: PHACOEMULSIFICATION, CATARACT, WITH IOL INSERTION 3.52 00:39.4;  Surgeon: Novak Adine Anes, MD;  Location: [facility];  Service: Ophthalmology;  Laterality: Left;   CORONARY/GRAFT ACUTE MI REVASCULARIZATION N/A 11/19/2022   Procedure: Coronary/Graft Acute MI Revascularization;  Surgeon: Darron Deatrice LABOR, MD;  Location: [facility] INVASIVE CV LAB;  Service: Cardiovascular;  Laterality: N/A;   CYSTOSCOPY     LEFT HEART CATH AND CORONARY ANGIOGRAPHY N/A 11/19/2022   Procedure: LEFT HEART CATH AND CORONARY ANGIOGRAPHY;  Surgeon: Darron Deatrice LABOR, MD;  Location: [facility] INVASIVE CV LAB;  Service: Cardiovascular;  Laterality: N/A;    Prior to Admission medications   Medication Sig Start Date End Date Taking? Authorizing Provider  ACCU-CHEK GUIDE test strip  01/03/22  Yes [provider]  apixaban  (ELIQUIS ) 5 MG TABS tablet Take 1 tablet (5 mg total) by mouth 2 (two)  times daily. 06/09/24  Yes Dunn, Bernardino HERO, PA-C  carvedilol  (COREG ) 12.5 MG tablet Take 1 tablet (12.5 mg total) by mouth 2 (two) times daily. 06/09/24  Yes Dunn, Bernardino HERO, PA-C  chlorthalidone  (HYGROTON ) 25 MG tablet Take 12.5 mg by mouth daily.   Yes [provider]  Cholecalciferol (D3) 25 MCG (1000 UT) capsule Take 1,000 Units by mouth daily.   Yes [provider]  cyanocobalamin (VITAMIN B12) 1000 MCG tablet Take 1,000 mcg by mouth daily.   Yes [provider]  escitalopram  (LEXAPRO ) 5 MG tablet Take 5 mg by mouth daily. 08/29/22  Yes [provider]  JARDIANCE  10 MG TABS tablet Take 1 tablet (10 mg total) by mouth daily. 06/09/24  Yes Dunn, Bernardino HERO, PA-C  magnesium  oxide (MAG-OX) 400 (240 Mg) MG tablet Take 1 tablet by mouth daily. 05/16/21  Yes [provider]  pantoprazole  (PROTONIX ) 40 MG tablet Take 1 tablet (40 mg total) by mouth daily. 05/29/24 05/29/25 Yes Dunn, Bernardino HERO, PA-C  rosuvastatin  (CRESTOR ) 20 MG tablet Take 1 tablet (20 mg total) by mouth daily. 06/09/24  Yes Dunn, Ryan M, PA-C  Semaglutide (OZEMPIC, 0.25 OR 0.5 MG/DOSE, Blaine) Inject 0.5 mg into the skin once a week.   Yes [provider]  valsartan  (DIOVAN ) 160 MG tablet Take 1 tablet (160 mg total) by mouth daily. 06/09/24  Yes Abigail Bernardino HERO, PA-C    Allergies as of 09/21/2024 - Review Complete 06/09/2024  Allergen  Reaction Noted   Keflex [cephalexin] Hives 05/22/2015   Hydrochlorothiazide-triamterene Nausea Only 09/13/2015   Lisinopril Other (See Comments) 03/10/2016   Penicillins Hives 05/22/2015   Statins Other (See Comments) 03/10/2016    Family History  Problem Relation Age of Onset   CVA Mother    Diabetes Mother    CVA Father     Social History   Socioeconomic History   Marital status: Widowed    Spouse name: Not on file   Number of children: Not on file   Years of education: Not on file   Highest education level: Not on file  Occupational History   Not on file   Tobacco Use   Smoking status: Former    Current packs/day: 0.00    Average packs/day: 0.3 packs/day for 20.0 years (5.0 ttl pk-yrs)    Types: Cigarettes    Start date: 26    Quit date: 2002    Years since quitting: 23.8   Smokeless tobacco: Never  Vaping Use   Vaping status: Never Used  Substance and Sexual Activity   Alcohol use: No   Drug use: Never   Sexual activity: Not on file  Other Topics Concern   Not on file  Social History Narrative   Not on file   Social Drivers of Health   Financial Resource Strain: Low Risk  (03/06/2024)   Received from [facility]   Overall Financial Resource Strain (CARDIA)    Difficulty of Paying Living Expenses: Not hard at all  Food Insecurity: No Food Insecurity (03/06/2024)   Received from [facility]   Hunger Vital Sign    Within the past 12 months, you worried that your food would run out before you got the money to buy more.: Never true    Within the past 12 months, the food you bought just didn't last and you didn't have money to get more.: Never true  Transportation Needs: No Transportation Needs (03/06/2024)   Received from [facility] - Transportation    In the past 12 months, has lack of transportation kept you from medical appointments or from getting medications?: No    Lack of Transportation (Non-Medical): No  Physical Activity: Not on file  Stress: Not on file  Social Connections: Not on file  Intimate Partner Violence: Not At Risk (11/19/2022)   Humiliation, Afraid, Rape, and Kick questionnaire    Fear of Current or Ex-Partner: No    Emotionally Abused: No    Physically Abused: No    Sexually Abused: No    Review of Systems: See HPI, otherwise negative ROS  Physical Exam: BP (!) 156/86   Pulse 72   Temp (!) 97.5 F (36.4 C) (Temporal)   Resp 15   Ht 4' 9 (1.448 m)   Wt 58.1 kg   SpO2 95%   BMI 27.70 kg/m  General:   Alert, cooperative. Head:   Normocephalic and atraumatic. Respiratory:  Normal work of breathing. Cardiovascular:  NAD  Impression/Plan: CICLEY GANESH is here for cataract surgery.  Risks, benefits, limitations, and alternatives regarding cataract surgery have been reviewed with the patient.  Questions have been answered.  All parties agreeable.   Adine Novak, MD  10/19/2024, 7:17 AM

## 2024-10-19 NOTE — Op Note (Signed)
 OPERATIVE NOTE  KRIPA FOSKEY [id number] 10/19/2024   PREOPERATIVE DIAGNOSIS:  Nuclear sclerotic cataract right eye.  H25.11   POSTOPERATIVE DIAGNOSIS:    Nuclear sclerotic cataract right eye.     PROCEDURE:  Phacoemusification with posterior chamber intraocular lens placement of the right eye   LENS:   Implant Name Type Inv. Item Serial No. Manufacturer Lot No. LRB No. Used Action  LENS IOL TECNIS EYHANCE 22.5 - [id number] Intraocular Lens LENS IOL TECNIS EYHANCE 22.5 [id number] [employer]  Right 1 Implanted       Procedure(s): PHACOEMULSIFICATION, CATARACT, WITH IOL INSERTION 5.70, 00:49.9 (Right)  SURGEON:  Adine Novak, MD, MPH  ANESTHESIOLOGIST: Anesthesiologist: Leavy Ned, MD CRNA: Jahoo, Sonia, CRNA   ANESTHESIA:  Topical with tetracaine drops augmented with 1% preservative-free intracameral lidocaine .  ESTIMATED BLOOD LOSS: less than 1 mL.   COMPLICATIONS:  None.   DESCRIPTION OF PROCEDURE:  The patient was identified in the holding room and transported to the operating room and placed in the supine position under the operating microscope.  The right eye was identified as the operative eye and it was prepped and draped in the usual sterile ophthalmic fashion.   A 1.0 millimeter clear-corneal paracentesis was made at the 10:30 position. 0.5 ml of preservative-free 1% lidocaine  with epinephrine  was injected into the anterior chamber.  The anterior chamber was filled with viscoelastic.  A 2.4 millimeter keratome was used to make a near-clear corneal incision at the 8:00 position.  A curvilinear capsulorrhexis was made with a cystotome and capsulorrhexis forceps.  Balanced salt solution was used to hydrodissect and hydrodelineate the nucleus.   Phacoemulsification was then used in stop and chop fashion to remove the lens nucleus and epinucleus.  The remaining cortex was then removed using the irrigation and aspiration handpiece. Viscoelastic was then placed into the  capsular bag to distend it for lens placement.  A lens was then injected into the capsular bag.  The remaining viscoelastic was aspirated.   Wounds were hydrated with balanced salt solution.  The anterior chamber was inflated to a physiologic pressure with balanced salt solution.   Intracameral vigamox 0.1 mL undiluted was injected into the eye and a drop placed onto the ocular surface.  No wound leaks were noted.  The patient was taken to the recovery room in stable condition without complications of anesthesia or surgery  Adine Novak 10/19/2024, 7:51 AM

## 2024-10-19 NOTE — Transfer of Care (Signed)
 Immediate Anesthesia Transfer of Care Note  Patient: Vanessa Young  Procedure(s) Performed: PHACOEMULSIFICATION, CATARACT, WITH IOL INSERTION 5.70, 00:49.9 (Right)  Patient Location: PACU  Anesthesia Type: MAC  Level of Consciousness: awake, alert  and patient cooperative  Airway and Oxygen Therapy: Patient Spontanous Breathing and Patient connected to supplemental oxygen  Post-op Assessment: Post-op Vital signs reviewed, Patient's Cardiovascular Status Stable, Respiratory Function Stable, Patent Airway and No signs of Nausea or vomiting  Post-op Vital Signs: Reviewed and stable  Complications: No notable events documented.

## 2024-12-07 ENCOUNTER — Ambulatory Visit
Admission: EM | Admit: 2024-12-07 | Discharge: 2024-12-07 | Disposition: A | Discharge status: Home / Self Care | Attending: Emergency Medicine | Admitting: Emergency Medicine

## 2024-12-07 ENCOUNTER — Encounter: Payer: Self-pay | Admitting: Emergency Medicine

## 2024-12-07 DIAGNOSIS — J3489 Other specified disorders of nose and nasal sinuses: Secondary | ICD-10-CM | POA: Diagnosis not present

## 2024-12-07 DIAGNOSIS — U071 COVID-19: Secondary | ICD-10-CM

## 2024-12-07 LAB — POC SOFIA SARS ANTIGEN FIA: SARS Coronavirus 2 Ag: POSITIVE — AB

## 2024-12-07 LAB — POCT INFLUENZA A/B
Influenza A, POC: NEGATIVE
Influenza B, POC: NEGATIVE

## 2024-12-07 LAB — POCT RESPIRATORY SYNCYTIAL VIRUS: RSV Antigen, POC: NEGATIVE

## 2024-12-07 MED ORDER — PROMETHAZINE-DM 6.25-15 MG/5ML PO SYRP: 5.0000 mL | ORAL_SOLUTION | Freq: Four times a day (QID) | ORAL | 0 refills | Status: AC | PRN

## 2024-12-07 MED ORDER — IPRATROPIUM BROMIDE 0.06 % NA SOLN: 2.0000 | Freq: Four times a day (QID) | NASAL | 12 refills | Status: AC

## 2024-12-07 MED ORDER — BENZONATATE 100 MG PO CAPS: 200.0000 mg | ORAL_CAPSULE | Freq: Three times a day (TID) | ORAL | 0 refills | Status: AC

## 2024-12-07 NOTE — Discharge Instructions (Addendum)
 CDC guidelines state that you must wear a mask for the first 5 days of symptoms when you are around other people.  After 5 days you no longer need to mask as you are no longer considered infectious.  There is no longer need to quarantine unless you have a fever.  If you do have a fever then you need to quarantine until you have been fever free for 24 hours without taking Tylenol  and/or ibuprofen.  Use over-the-counter Tylenol  according to the package instructions as needed for fever and pain.  Use the Atrovent  nasal spray, 2 squirts up each nostril every 6 hours, as needed for nasal congestion and runny nose.  Use the Tessalon  Perles every 8 hours during the day as needed for cough.  Take them with a small sip of water.  You may experience numbness to the base of your tongue or metallic taste in her mouth, this is normal.  Use the Promethazine  DM cough syrup at bedtime as needed for cough and congestion.  Be mindful this medication will make you sleepy.  Due to the fact that you are taking Eliquis  antivirals are contraindicated.  If you develop any worsening respiratory symptoms such as shortness of breath, shortness of breath at rest, feel as though you cannot catch your breath, you are unable to speak in full sentences, or, as a late sign, your lips begin turning blue you need to call 911 and go to the ER for evaluation.

## 2024-12-07 NOTE — ED Provider Notes (Signed)
 " [facility]    CSN: [id number] Arrival date & time: 12/07/24  9160      History   Chief Complaint Chief Complaint  Patient presents with   Cough    HPI Vanessa Young is a 83 y.o. female.   HPI  83 year old female with past medical history significant for atrial fibrillation, hypertension, OSA on CPAP, SCC, type 2 diabetes, GERD, high cholesterol, general anxiety disorder, and OA presents for evaluation of respiratory symptoms that began 2 days ago and include subjective fever, runny nose, nasal congestion for Klindt nasal discharge, nonproductive cough, and intermittent wheezing.  She denies any sore throat, ear pain or shortness of breath.  She has not taken anything over-the-counter due to the fact that she takes Eliquis  twice daily.  Past Medical History:  Diagnosis Date   A-fib [facility])    Chronic kidney disease    Coronary artery disease    Depression    GERD (gastroesophageal reflux disease)    Grade I diastolic dysfunction    History of heart artery stent 11/2022   Hypercholesteremia    Hypertension    Myocardial infarction (HCC)    2023 with stent placement   OSA on CPAP    Paroxysmal atrial fibrillation with rapid ventricular response (HCC)    S/P drug eluting coronary stent placement    Sleep apnea    uses cpap   ST-segment elevation myocardial infarction (STEMI) of inferior wall (HCC) 11/19/2022   Stroke (HCC)    2023   TIA (transient ischemic attack) 11/2022   Type 2 diabetes mellitus with diabetic polyneuropathy [facility])     Patient Active Problem List   Diagnosis Date Noted   CAD (coronary atherosclerotic disease) 04/02/2023   Aphasia 04/02/2023   H/O: CVA (cerebrovascular accident) 12/18/2022   Overweight (BMI 25.0-29.9) 11/24/2022   Acute CVA (cerebrovascular accident) (HCC) 11/23/2022   AKI (acute kidney injury) 11/23/2022   Diabetes mellitus without complication (HCC) 11/23/2022   Mixed hyperlipidemia 11/22/2022   ST elevation  myocardial infarction (STEMI) (HCC) 11/19/2022   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   Hydronephrosis 09/05/2020   Increased frequency of urination 09/05/2020   Microhematuria 09/05/2020   Aerophagia 05/25/2019   Thoracic aortic atherosclerosis 12/23/2018   Paroxysmal atrial fibrillation with rapid ventricular response (HCC) 11/10/2018   SCC (squamous cell carcinoma), scalp/neck 03/23/2016   OSA on CPAP 06/11/2015   Lesion of bladder 11/08/2014   PAF (paroxysmal atrial fibrillation) (HCC) 01/20/2014   Osteoarthrosis, 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   Osteopenia 01/19/2013   Adenomatous polyp of colon 02/15/2012   OA (osteoarthritis) of knee 11/23/2011   Type 2 diabetes mellitus with microalbuminuria, without long-term current use of insulin  (HCC) 09/28/2011   GERD (gastroesophageal reflux disease) 09/28/2011   Major depressive disorder, recurrent, in partial remission 09/28/2011   Primary hypertension 06/21/2011   Hypercholesterolemia 06/21/2011   Generalized anxiety disorder 06/21/2011    Past Surgical History:  Procedure Laterality Date   CATARACT EXTRACTION W/PHACO Left 10/05/2024   Procedure: PHACOEMULSIFICATION, CATARACT, WITH IOL INSERTION 3.52 00:39.4;  Surgeon: Myrna Adine Anes, MD;  Location: [facility];  Service: Ophthalmology;  Laterality: Left;   CATARACT EXTRACTION W/PHACO Right 10/19/2024   Procedure: PHACOEMULSIFICATION, CATARACT, WITH IOL INSERTION 5.70, 00:49.9;  Surgeon: Myrna Adine Anes, MD;  Location: [facility];  Service: Ophthalmology;  Laterality: Right;   CORONARY/GRAFT ACUTE MI REVASCULARIZATION N/A 11/19/2022   Procedure: Coronary/Graft Acute MI Revascularization;  Surgeon: Darron Deatrice LABOR, MD;  Location: [facility] INVASIVE CV LAB;  Service:  Cardiovascular;  Laterality: N/A;   CYSTOSCOPY     LEFT HEART CATH AND CORONARY ANGIOGRAPHY N/A 11/19/2022   Procedure: LEFT HEART CATH AND CORONARY ANGIOGRAPHY;  Surgeon: Darron Deatrice LABOR, MD;  Location: [facility]  INVASIVE CV LAB;  Service: Cardiovascular;  Laterality: N/A;    OB History   No obstetric history on file.      Home Medications    Prior to Admission medications  Medication Sig Start Date End Date Taking? Authorizing Provider  ACCU-CHEK GUIDE test strip  01/03/22  Yes [provider]  apixaban  (ELIQUIS ) 5 MG TABS tablet Take 1 tablet (5 mg total) by mouth 2 (two) times daily. 06/09/24  Yes Dunn, Bernardino HERO, PA-C  benzonatate  (TESSALON ) 100 MG capsule Take 2 capsules (200 mg total) by mouth every 8 (eight) hours. 12/07/24  Yes Bernardino Ditch, NP  carvedilol  (COREG ) 12.5 MG tablet Take 1 tablet (12.5 mg total) by mouth 2 (two) times daily. 06/09/24  Yes Dunn, Bernardino HERO, PA-C  chlorthalidone  (HYGROTON ) 25 MG tablet Take 12.5 mg by mouth daily.   Yes [provider]  Cholecalciferol (D3) 25 MCG (1000 UT) capsule Take 1,000 Units by mouth daily.   Yes [provider]  cyanocobalamin (VITAMIN B12) 1000 MCG tablet Take 1,000 mcg by mouth daily.   Yes [provider]  escitalopram  (LEXAPRO ) 5 MG tablet Take 5 mg by mouth daily. 08/29/22  Yes [provider]  ipratropium (ATROVENT ) 0.06 % nasal spray Place 2 sprays into both nostrils 4 (four) times daily. 12/07/24  Yes Bernardino Ditch, NP  JARDIANCE  10 MG TABS tablet Take 1 tablet (10 mg total) by mouth daily. 06/09/24  Yes Dunn, Bernardino HERO, PA-C  magnesium  oxide (MAG-OX) 400 (240 Mg) MG tablet Take 1 tablet by mouth daily. 05/16/21  Yes [provider]  pantoprazole  (PROTONIX ) 40 MG tablet Take 1 tablet (40 mg total) by mouth daily. 05/29/24 05/29/25 Yes Dunn, Bernardino HERO, PA-C  promethazine -dextromethorphan (PROMETHAZINE -DM) 6.25-15 MG/5ML syrup Take 5 mLs by mouth 4 (four) times daily as needed. 12/07/24  Yes Bernardino Ditch, NP  rosuvastatin  (CRESTOR ) 20 MG tablet Take 1 tablet (20 mg total) by mouth daily. 06/09/24  Yes Dunn, Zaylee Cornia M, PA-C  Semaglutide (OZEMPIC, 0.25 OR 0.5 MG/DOSE, [place]) Inject 0.5 mg into the skin once a  week.   Yes [provider]  valsartan  (DIOVAN ) 160 MG tablet Take 1 tablet (160 mg total) by mouth daily. 06/09/24  Yes Dunn, Bernardino HERO, PA-C    Family History Family History  Problem Relation Age of Onset   CVA Mother    Diabetes Mother    CVA Father     Social History Social History[1]   Allergies   Keflex [cephalexin], Hydrochlorothiazide-triamterene, Lisinopril, Penicillins, and Statins   Review of Systems Review of Systems  Constitutional:  Positive for fever.  HENT:  Positive for congestion and rhinorrhea. Negative for ear pain and sore throat.   Respiratory:  Positive for cough and wheezing. Negative for shortness of breath.      Physical Exam Triage Vital Signs ED Triage Vitals  Encounter Vitals Group     BP      Girls Systolic BP Percentile      Girls Diastolic BP Percentile      Boys Systolic BP Percentile      Boys Diastolic BP Percentile      Pulse      Resp      Temp      Temp src  SpO2      Weight      Height      Head Circumference      Peak Flow      Pain Score      Pain Loc      Pain Education      Exclude from Growth Chart    No data found.  Updated Vital Signs BP (!) 137/90 (BP Location: Left Arm)   Pulse 83   Temp 98.9 F (37.2 C) (Oral)   Resp 17   Wt 128 lb (58.1 kg)   SpO2 97%   BMI 27.70 kg/m   Visual Acuity Right Eye Distance:   Left Eye Distance:   Bilateral Distance:    Right Eye Near:   Left Eye Near:    Bilateral Near:     Physical Exam Vitals and nursing note reviewed.  Constitutional:      Appearance: Normal appearance. She is not ill-appearing.  HENT:     Head: Normocephalic and atraumatic.     Right Ear: Tympanic membrane, ear canal and external ear normal. There is no impacted cerumen.     Left Ear: Tympanic membrane, ear canal and external ear normal. There is no impacted cerumen.     Nose: Congestion and rhinorrhea present.     Comments: Nasal mucosa is edematous and erythematous with  yellow discharge in both nares.    Mouth/Throat:     Mouth: Mucous membranes are moist.     Pharynx: Oropharynx is clear. No oropharyngeal exudate or posterior oropharyngeal erythema.  Cardiovascular:     Rate and Rhythm: Normal rate and regular rhythm.     Pulses: Normal pulses.     Heart sounds: Normal heart sounds. No murmur heard.    No friction rub. No gallop.  Pulmonary:     Effort: Pulmonary effort is normal.     Breath sounds: Normal breath sounds. No wheezing, rhonchi or rales.  Musculoskeletal:     Cervical back: Normal range of motion and neck supple. No tenderness.  Lymphadenopathy:     Cervical: No cervical adenopathy.  Skin:    General: Skin is warm and dry.     Capillary Refill: Capillary refill takes less than 2 seconds.     Findings: No rash.  Neurological:     General: No focal deficit present.     Mental Status: She is alert and oriented to person, place, and time.      UC Treatments / Results  Labs (all labs ordered are listed, but only abnormal results are displayed) Labs Reviewed  POC SOFIA SARS ANTIGEN FIA - Abnormal; Notable for the following components:      Result Value   SARS Coronavirus 2 Ag Positive (*)    All other components within normal limits  POCT INFLUENZA A/B - Normal  POCT RESPIRATORY SYNCYTIAL VIRUS - Normal    EKG   Radiology No results found.  Procedures Procedures (including critical care time)  Medications Ordered in UC Medications - No data to display  Initial Impression / Assessment and Plan / UC Course  I have reviewed the triage vital signs and the nursing notes.  Pertinent labs & imaging results that were available during my care of the patient were reviewed by me and considered in my medical decision making (see chart for details).   Patient is a nontoxic-appearing 83 year old female presenting for evaluation 2 days with respiratory symptoms as outlined in HPI above.  She came in because she reports that  she is  prone to bronchitis and she wanted to ensure that she did not have that.  She reports that she is experiencing a nonproductive cough but also some intermittent wheezing.  In the exam room she is able to speak in full sentence without dyspnea or tachypnea and her lung sounds are clear to auscultation in all fields.  Differential diagnosis include COVID, influenza, RSV, viral respiratory illness.  I will order antigen test for the above 3 illnesses.  COVID antigen test is positive.  Influenza antigen test is negative.  RSV antigen test is negative.  I will discharge patient home with a diagnosis of COVID-19.  Given that she is on Eliquis  antivirals are contraindicated.  I will prescribe Atrovent  nasal spray for her congestion along with Tessalon  Perles and Promethazine  DM cough syrup for cough and congestion.  Return and ER precautions reviewed.   Final Clinical Impressions(s) / UC Diagnoses   Final diagnoses:  Sinus pressure  COVID-19     Discharge Instructions      CDC guidelines state that you must wear a mask for the first 5 days of symptoms when you are around other people.  After 5 days you no longer need to mask as you are no longer considered infectious.  There is no longer need to quarantine unless you have a fever.  If you do have a fever then you need to quarantine until you have been fever free for 24 hours without taking Tylenol  and/or ibuprofen.  Use over-the-counter Tylenol  according to the package instructions as needed for fever and pain.  Use the Atrovent  nasal spray, 2 squirts up each nostril every 6 hours, as needed for nasal congestion and runny nose.  Use the Tessalon  Perles every 8 hours during the day as needed for cough.  Take them with a small sip of water.  You may experience numbness to the base of your tongue or metallic taste in her mouth, this is normal.  Use the Promethazine  DM cough syrup at bedtime as needed for cough and congestion.  Be mindful this  medication will make you sleepy.  Due to the fact that you are taking Eliquis  antivirals are contraindicated.  If you develop any worsening respiratory symptoms such as shortness of breath, shortness of breath at rest, feel as though you cannot catch your breath, you are unable to speak in full sentences, or, as a late sign, your lips begin turning blue you need to call 911 and go to the ER for evaluation.      ED Prescriptions     Medication Sig Dispense Auth. Provider   benzonatate  (TESSALON ) 100 MG capsule Take 2 capsules (200 mg total) by mouth every 8 (eight) hours. 21 capsule Bernardino Ditch, NP   ipratropium (ATROVENT ) 0.06 % nasal spray Place 2 sprays into both nostrils 4 (four) times daily. 15 mL Bernardino Ditch, NP   promethazine -dextromethorphan (PROMETHAZINE -DM) 6.25-15 MG/5ML syrup Take 5 mLs by mouth 4 (four) times daily as needed. 118 mL Bernardino Ditch, NP      PDMP not reviewed this encounter.     [1]  Social History Tobacco Use   Smoking status: Former    Current packs/day: 0.00    Average packs/day: 0.3 packs/day for 20.0 years (5.0 ttl pk-yrs)    Types: Cigarettes    Start date: 69    Quit date: 2002    Years since quitting: 23.9   Smokeless tobacco: Never  Vaping Use   Vaping status: Never Used  Substance  Use Topics   Alcohol use: No   Drug use: Never     Bernardino Ditch, NP 12/07/24 [phone]  "

## 2024-12-07 NOTE — ED Triage Notes (Signed)
 Sx x 2 days  Can't take OTC meds. Patient states that she's on 2 blood thinners  Cough  Fever

## 2024-12-11 ENCOUNTER — Ambulatory Visit: Admitting: Physician Assistant

## 2024-12-29 NOTE — Progress Notes (Unsigned)
 "  Cardiology Office Note    Date:  01/01/2025   ID:  Vanessa Young, DOB 05-Jun-1941, MRN [id number]  PCP:  Calista Bradley, MD  Cardiologist:  Deatrice Cage, MD  Electrophysiologist:  None   Chief Complaint: Follow up  History of Present Illness:   Vanessa Young is a 84 y.o. female with history of CAD with inferior STEMI in 11/2022 s/p PCI/DES to the RCA, PAF on apixaban , CVA/TIA, DM2, HTN, and HLD who presents for follow-up of CAD and A-fib.   She was previously followed by Drs. Bosie armin Cleveland, with [facility], for A-fib with transition of her care to our office during admission in 11/2022.   Prior to her admission in 11/2022, she had no known history of ischemic heart disease.  She was seen in the ED on 11/18/2022 with generalized weakness and headache and felt to be volume depleted with symptomatic improvement following IV fluids.  Following discharge, she began to have substernal chest pain described as a burning sensation across the whole chest radiating into the bilateral arms.  She slept and felt better, though was woken up with substernal chest tightness and heartburn.  EMS was called with EKG in the field showing evidence of an inferior ST elevation MI leading to activation of code STEMI.  Upon cardiology evaluation, she was still having angina.  She reported that she did not take Xarelto  daily, instead took this 3 times weekly.  Emergent LHC showed severe three-vessel CAD with the culprit lesion being a subtotal occlusion of the mid RCA.  In addition, there was an occluded OM1 with collaterals and diffuse subocclusive disease throughout the mid and distal LAD.  Moderately reduced LV systolic function estimated at 35 to 45% with severely elevated LVEDP at 35 mmHg.  She underwent successful PCI/DES to the mid RCA.  It was felt the LAD was not optimal for PCI given long diffuse disease with medical therapy favored.  Post intervention echo during the admission showed an EF of 60 to 65%, mild  hypokinesis of the basal mid inferior wall and inferolateral wall, mild LVH, grade 1 diastolic dysfunction, normal RV systolic function and ventricular cavity size, and trivial aortic insufficiency.  High-sensitivity troponin peaked at 3612.     Following discharge, she was readmitted to the hospital with sudden onset of left-sided weakness with expressive aphasia and dysarthria which resolved prior to presentation.  MRI of the brain notable for multiple small embolic infarcts.  She was evaluated by neurology with recommendation to continue apixaban  and clopidogrel  as well as recently titrated dose of rosuvastatin .  Prilosec was changed to Protonix .     She was seen in the ED on 04/02/2023 with aphasia and headache that started several hours prior.  BP in the 190s to low 200s systolic in the ER.  CT head showed no acute intracranial abnormality.  Neurology was tele-consulted.  MRI of the brain showed no acute intracranial abnormality.  MRA of the head/neck showed no emergent large vessel occlusive disease or high-grade stenosis with mild narrowing of the right MCA M1 segment and findings consistent with chronic small vessel ischemia and volume loss.  Patient returned to baseline without further intervention.  No changes were made in outpatient medications.    She was seen in the office in 03/2023 and reported some exertional chest heaviness that improved after episodes of belching, and predated her MI.  She was started on Imdur  30 mg, however this had to be discontinued due to headache.  Given her TIA, she underwent an echo on 05/23/2023 demonstrated an EF of 55 to 60%, no regional wall motion abnormalities, mild LVH, grade 1 diastolic dysfunction, normal RV systolic function and ventricular cavity size, RVSP 33.5 mmHg, mild mitral regurgitation, mild to moderate tricuspid regurgitation, mild aortic insufficiency, normal CVP, and negative bubble study.   She was seen in the office in 08/2023 for discussion of  elevated BP readings.  She reported BPs predominantly in the 130s mmHg systolic with an occasional 150s.  Previously noted fatigue was improved off amlodipine .  She was transitioned from losartan  to valsartan  160 mg with continuation of carvedilol  12.5 mg twice daily.  She was last seen in the office in 05/2024 and continued to do well from a cardiac perspective.  BP was well-controlled.  No changes in cardiac pharmacotherapy were pursued at that time.  She comes in continuing to do very well from a cardiac perspective and is without symptoms of angina or cardiac decompensation.  She did have COVID infection over the Christmas holiday without significant lingering aftereffect.  No dyspnea, dizziness, presyncope, or syncope.  No lower extremity swelling or progressive orthopnea.  No falls or symptoms concerning for bleeding.  Has not yet taken the medications this morning.  She does not have any acute cardiac concerns at this time.   Labs independently reviewed: 09/2024 - A1c 7.0 02/2024 - TC 134, TG 222, HDL 36, LDL 54, potassium 4, BUN 31, serum creatinine 1.2 11/2023 - Hgb 13.6, PLT 245 03/2023 - albumin 3.8, AST/ALT normal 11/2022 - LP(a) 17.6  Past Medical History:  Diagnosis Date   A-fib (HCC)    Chronic kidney disease    Coronary artery disease    Depression    GERD (gastroesophageal reflux disease)    Grade I diastolic dysfunction    History of heart artery stent 11/2022   Hypercholesteremia    Hypertension    Myocardial infarction (HCC)    2023 with stent placement   OSA on CPAP    Paroxysmal atrial fibrillation with rapid ventricular response (HCC)    S/P drug eluting coronary stent placement    Sleep apnea    uses cpap   ST-segment elevation myocardial infarction (STEMI) of inferior wall (HCC) 11/19/2022   Stroke (HCC)    2023   TIA (transient ischemic attack) 11/2022   Type 2 diabetes mellitus with diabetic polyneuropathy [facility])     Past Surgical History:  Procedure  Laterality Date   CATARACT EXTRACTION W/PHACO Left 10/05/2024   Procedure: PHACOEMULSIFICATION, CATARACT, WITH IOL INSERTION 3.52 00:39.4;  Surgeon: Myrna Adine Anes, MD;  Location: [facility];  Service: Ophthalmology;  Laterality: Left;   CATARACT EXTRACTION W/PHACO Right 10/19/2024   Procedure: PHACOEMULSIFICATION, CATARACT, WITH IOL INSERTION 5.70, 00:49.9;  Surgeon: Myrna Adine Anes, MD;  Location: [facility];  Service: Ophthalmology;  Laterality: Right;   CORONARY/GRAFT ACUTE MI REVASCULARIZATION N/A 11/19/2022   Procedure: Coronary/Graft Acute MI Revascularization;  Surgeon: Darron Deatrice LABOR, MD;  Location: [facility];  Service: Cardiovascular;  Laterality: N/A;   CYSTOSCOPY     LEFT HEART CATH AND CORONARY ANGIOGRAPHY N/A 11/19/2022   Procedure: LEFT HEART CATH AND CORONARY ANGIOGRAPHY;  Surgeon: Darron Deatrice LABOR, MD;  Location: [facility] CV [facility];  Service: Cardiovascular;  Laterality: N/A;    Current Medications: Active Medications[1]  Allergies:   Keflex [cephalexin], Hydrochlorothiazide-triamterene, Lisinopril, Penicillins, and Statins   Social History   Socioeconomic History   Marital status: Widowed    Spouse  name: Not on file   Number of children: Not on file   Years of education: Not on file   Highest education level: Not on file  Occupational History   Not on file  Tobacco Use   Smoking status: Former    Current packs/day: 0.00    Average packs/day: 0.3 packs/day for 20.0 years (5.0 ttl pk-yrs)    Types: Cigarettes    Start date: 32    Quit date: 2002    Years since quitting: 24.0   Smokeless tobacco: Never  Vaping Use   Vaping status: Never Used  Substance and Sexual Activity   Alcohol use: No   Drug use: Never   Sexual activity: Not on file  Other Topics Concern   Not on file  Social History Narrative   Not on file   Social Drivers of Health   Tobacco Use: Medium Risk (01/01/2025)   Patient History    Smoking  Tobacco Use: Former    Smokeless Tobacco Use: Never    Passive Exposure: Not on Actuary Strain: Low Risk  (03/06/2024)   Received from [facility]   Overall Financial Resource Strain (CARDIA)    Difficulty of Paying Living Expenses: Not hard at all  Food Insecurity: No Food Insecurity (03/06/2024)   Received from [facility]   Epic    Within the past 12 months, you worried that your food would run out before you got the money to buy more.: Never true    Within the past 12 months, the food you bought just didn't last and you didn't have money to get more.: Never true  Transportation Needs: No Transportation Needs (03/06/2024)   Received from [facility] - Transportation    In the past 12 months, has lack of transportation kept you from medical appointments or from getting medications?: No    Lack of Transportation (Non-Medical): No  Physical Activity: Not on file  Stress: Not on file  Social Connections: Not on file  Depression (PHQ2-9): Low Risk (02/18/2023)   Depression (PHQ2-9)    PHQ-2 Score: 2  Recent Concern: Depression (PHQ2-9) - Medium Risk (12/12/2022)   Depression (PHQ2-9)    PHQ-2 Score: 9  Alcohol Screen: Not on file  Housing: Unknown (03/06/2024)   Received from [facility]   Epic    In the last 12 months, was there a time when you were not able to pay the mortgage or rent on time?: No    Number of Times Moved in the Last Year: Not on file    At any time in the past 12 months, were you homeless or living in a shelter (including now)?: No  Utilities: Not At Risk (03/06/2024)   Received from [facility] Utilities    Threatened with loss of utilities: No  Health Literacy: Not on file     Family History:  The patient's family history includes CVA in her father and mother; Diabetes in her mother.  ROS:   12-point review of systems is negative unless  otherwise noted in the HPI.   EKGs/Labs/Other Studies Reviewed:    Studies reviewed were summarized above. The additional studies were reviewed today:  2D echo 11/10/2018: - Left ventricle: Wall thickness was increased in a pattern of mild    LVH. Systolic function was normal. The estimated ejection    fraction was in the range of 55% to 60%.  Doppler parameters are    consistent with abnormal left ventricular relaxation (grade 1    diastolic dysfunction).  - Right ventricle: The cavity size was mildly dilated.  - Pulmonary arteries: PA peak pressure: 37 mm Hg (S).  __________   LHC 11/19/2022:   1st Mrg lesion is 100% stenosed.   Prox LAD to Mid LAD lesion is 40% stenosed.   1st Diag lesion is 60% stenosed.   Mid LAD to Dist LAD lesion is 99% stenosed.   RPDA lesion is 85% stenosed.   Prox RCA to Mid RCA lesion is 40% stenosed.   Mid RCA to Dist RCA lesion is 99% stenosed.   A drug-eluting stent was successfully placed using a STENT ONYX FRONTIER 3.5X30.   Post intervention, there is a 0% residual stenosis.   There is moderate left ventricular systolic dysfunction.   LV end diastolic pressure is severely elevated.   The left ventricular ejection fraction is 35-45% by visual estimate.   1.  Severe three-vessel coronary artery disease.  The culprit is a subtotal occlusion of the mid right coronary artery.  In addition, the patient has occluded OM1 with collaterals and diffuse subocclusive disease throughout the mid and distal LAD. 2.  Moderately reduced LV systolic function with severely elevated left ventricular end-diastolic pressure 35 mmHg. 3.  Successful angioplasty and drug-eluting stent placement to the mid right coronary artery. 4.  Door to device was delayed by difficult access via the right radial artery given calcified, tortuous and stenosed proximal radial artery.  This was navigated successfully with a run-through wire using balloon assisted tracking.    Recommendations: Will treat with aspirin  and ticagrelor  for now. The patient is chronically anticoagulated for atrial fibrillation.  Anticoagulation can likely be resumed tomorrow if no bleeding issues but should consider switching Xarelto  to Eliquis  and stopping aspirin  to minimize the risk of bleeding considering her age. I do not think the LAD is optimal for PCI given long diffuse disease.  Favor medical therapy. __________   2D echo 11/20/2022: 1. Left ventricular ejection fraction, by estimation, is 60 to 65%. The  left ventricle has normal function. The left ventricle demonstrates  regional wall motion abnormalities (see scoring diagram/findings for  description). There is mild left ventricular   hypertrophy. Left ventricular diastolic parameters are consistent with  Grade I diastolic dysfunction (impaired relaxation). Elevated left atrial  pressure. There is mild hypokinesis of the left ventricular, basal-mid  inferior wall and inferolateral wall.   2. Right ventricular systolic function is normal. The right ventricular  size is normal. Tricuspid regurgitation signal is inadequate for assessing  PA pressure.   3. The mitral valve is normal in structure. No evidence of mitral valve  regurgitation. No evidence of mitral stenosis.   4. The aortic valve is tricuspid. Aortic valve regurgitation is trivial.  No aortic stenosis is present. __________   2D echo 05/23/2023: 1. Left ventricular ejection fraction, by estimation, is 55 to 60%. Left  ventricular ejection fraction by PLAX is 63 %. The left ventricle has  normal function. The left ventricle has no regional wall motion  abnormalities. There is mild left ventricular  hypertrophy. Left ventricular diastolic parameters are consistent with  Grade I diastolic dysfunction (impaired relaxation). The average left  ventricular global longitudinal strain is -16.4 %.   2. Right ventricular systolic function is normal. The right  ventricular  size is normal. There is normal pulmonary artery systolic pressure. The  estimated right ventricular systolic pressure is 33.5  mmHg.   3. The mitral valve is normal in structure. Mild mitral valve  regurgitation. No evidence of mitral stenosis.   4. Tricuspid valve regurgitation is mild to moderate.   5. The aortic valve is tricuspid. Aortic valve regurgitation is mild. No  aortic stenosis is present.   6. The inferior vena cava is normal in size with greater than 50%  respiratory variability, suggesting right atrial pressure of 3 mmHg.   7. Agitated saline contrast bubble study was negative, with no evidence  of any interatrial shunt.   Comparison(s): EF 60%, mild LVH, mild hypokinesis basal-mid inferior wall,  and inferolateral wall.    EKG:  EKG is ordered today.  The EKG ordered today demonstrates NSR, 64 bpm, first-degree AV block, possible prior anterior infarct, prior inferior infarct, poor R wave progression along the precordial leads, nonspecific lateral ST-T changes, consistent with prior tracings  Recent Labs: No results found for requested labs within last 365 days.  Recent Lipid Panel    Component Value Date/Time   CHOL 111 04/03/2023 0513   TRIG 226 (H) 04/03/2023 0513   HDL 34 (L) 04/03/2023 0513   CHOLHDL 3.3 04/03/2023 0513   VLDL 45 (H) 04/03/2023 0513   LDLCALC 32 04/03/2023 0513   LDLDIRECT 154 (H) 11/19/2022 1446    PHYSICAL EXAM:    VS:  BP (!) 150/80 (BP Location: Left Arm, Patient Position: Sitting, Cuff Size: Normal) Comment: NO meds yet  Pulse 64 Comment: 80 oximeter  Ht 4' 9 (1.448 m)   Wt 131 lb 3.2 oz (59.5 kg)   SpO2 98%   BMI 28.39 kg/m   BMI: Body mass index is 28.39 kg/m.  Physical Exam Vitals reviewed.  Constitutional:      Appearance: She is well-developed.  HENT:     Head: Normocephalic and atraumatic.  Eyes:     General:        Right eye: No discharge.        Left eye: No discharge.  Cardiovascular:     Rate and  Rhythm: Normal rate and regular rhythm.     Pulses:          Posterior tibial pulses are 2+ on the right side and 2+ on the left side.     Heart sounds: Normal heart sounds, S1 normal and S2 normal. Heart sounds not distant. No midsystolic click and no opening snap. No murmur heard.    No friction rub.  Pulmonary:     Effort: Pulmonary effort is normal. No respiratory distress.     Breath sounds: Normal breath sounds. No decreased breath sounds, wheezing, rhonchi or rales.  Musculoskeletal:     Cervical back: Normal range of motion.     Right lower leg: No edema.     Left lower leg: No edema.  Skin:    General: Skin is warm and dry.     Nails: There is no clubbing.  Neurological:     Mental Status: She is alert and oriented to person, place, and time.  Psychiatric:        Speech: Speech normal.        Behavior: Behavior normal.        Thought Content: Thought content normal.        Judgment: Judgment normal.     Wt Readings from Last 3 Encounters:  01/01/25 131 lb 3.2 oz (59.5 kg)  12/07/24 128 lb (58.1 kg)  10/19/24 128 lb (58.1 kg)     ASSESSMENT &  PLAN:   CAD involving the native coronary arteries without angina: She continues to do very well and is without symptoms of angina or cardiac decompensation.  Continue aggressive risk factor modification and secondary prevention including apixaban  in lieu of aspirin  given underlying A-fib as well as carvedilol  12.5 mg twice daily and rosuvastatin  20 mg daily.  No indication for further ischemic testing at this time as residual LAD stenosis is not optimal for PCI, and in the context of lack of anginal symptoms.  PAF: Maintaining sinus rhythm on carvedilol  12.5 mg twice daily which will be continued.  CHA2DS2-VASc at least 8.  Remains on apixaban  5 mg twice daily.  Monitor weight trend, if weight remains less than 60 kg follow-up (currently uptrending) would need to reduce her apixaban  dose to 2.5 mg twice daily.  No symptoms concerning  for bleeding or falls.  Check BMP and CBC.  HTN: Blood pressure is mildly elevated in the office this morning, though she has not yet taken morning medications.  Remains on carvedilol  12.5 mg twice daily and valsartan  160 mg daily.  Check BMP.  HLD: LDL 54 in 02/2024.  Remains on rosuvastatin  20 mg.  Lipid panel and LFT.  CVA/TIA: No new focal neurological deficits.  Remains on apixaban  as outlined above as well as rosuvastatin .    Disposition: F/u with Dr. Darron or an APP in 6 months.   Medication Adjustments/Labs and Tests Ordered: Current medicines are reviewed at length with the patient today.  Concerns regarding medicines are outlined above. Medication changes, Labs and Tests ordered today are summarized above and listed in the Patient Instructions accessible in Encounters.   Signed, Bernardino Bring, PA-C 01/01/2025 9:49 AM     [facility] [address] [place], [place] [address] [phone]     [1]  Current Meds  Medication Sig   ACCU-CHEK GUIDE test strip    apixaban  (ELIQUIS ) 5 MG TABS tablet Take 1 tablet (5 mg total) by mouth 2 (two) times daily.   benzonatate  (TESSALON ) 100 MG capsule Take 2 capsules (200 mg total) by mouth every 8 (eight) hours.   carvedilol  (COREG ) 12.5 MG tablet Take 1 tablet (12.5 mg total) by mouth 2 (two) times daily.   chlorthalidone  (HYGROTON ) 25 MG tablet Take 12.5 mg by mouth daily.   Cholecalciferol (D3) 25 MCG (1000 UT) capsule Take 1,000 Units by mouth daily.   cyanocobalamin (VITAMIN B12) 1000 MCG tablet Take 1,000 mcg by mouth daily.   escitalopram  (LEXAPRO ) 5 MG tablet Take 5 mg by mouth daily.   ipratropium (ATROVENT ) 0.06 % nasal spray Place 2 sprays into both nostrils 4 (four) times daily.   JARDIANCE  10 MG TABS tablet Take 1 tablet (10 mg total) by mouth daily.   magnesium  oxide (MAG-OX) 400 (240 Mg) MG tablet Take 1 tablet by mouth daily.   pantoprazole  (PROTONIX ) 40 MG tablet Take 1 tablet (40 mg  total) by mouth daily.   promethazine -dextromethorphan (PROMETHAZINE -DM) 6.25-15 MG/5ML syrup Take 5 mLs by mouth 4 (four) times daily as needed.   rosuvastatin  (CRESTOR ) 20 MG tablet Take 1 tablet (20 mg total) by mouth daily.   Semaglutide (OZEMPIC, 0.25 OR 0.5 MG/DOSE, [place]) Inject 0.5 mg into the skin once a week.   valsartan  (DIOVAN ) 160 MG tablet Take 1 tablet (160 mg total) by mouth daily.   "

## 2025-01-01 ENCOUNTER — Encounter: Payer: Self-pay | Admitting: Physician Assistant

## 2025-01-01 ENCOUNTER — Ambulatory Visit: Attending: Physician Assistant | Admitting: Physician Assistant

## 2025-01-01 VITALS — BP 150/80 | HR 64 | Ht <= 58 in | Wt 131.2 lb

## 2025-01-01 DIAGNOSIS — E785 Hyperlipidemia, unspecified: Secondary | ICD-10-CM

## 2025-01-01 DIAGNOSIS — I10 Essential (primary) hypertension: Secondary | ICD-10-CM

## 2025-01-01 DIAGNOSIS — I480 Paroxysmal atrial fibrillation: Secondary | ICD-10-CM | POA: Diagnosis not present

## 2025-01-01 DIAGNOSIS — Z79899 Other long term (current) drug therapy: Secondary | ICD-10-CM | POA: Diagnosis not present

## 2025-01-01 DIAGNOSIS — Z8673 Personal history of transient ischemic attack (TIA), and cerebral infarction without residual deficits: Secondary | ICD-10-CM | POA: Diagnosis not present

## 2025-01-01 DIAGNOSIS — G459 Transient cerebral ischemic attack, unspecified: Secondary | ICD-10-CM | POA: Diagnosis not present

## 2025-01-01 DIAGNOSIS — I2510 Atherosclerotic heart disease of native coronary artery without angina pectoris: Secondary | ICD-10-CM | POA: Diagnosis not present

## 2025-01-01 NOTE — Patient Instructions (Signed)
 Medication Instructions:  Your physician recommends that you continue on your current medications as directed. Please refer to the Current Medication list given to you today.  *If you need a refill on your cardiac medications before your next appointment, please call your pharmacy*  Lab Work: Your provider would like for you to have following labs drawn today CBC, CMET, LIPID PANEL.   If you have labs (blood work) drawn today and your tests are completely normal, you will receive your results only by: MyChart Message (if you have MyChart) OR A paper copy in the mail If you have any lab test that is abnormal or we need to change your treatment, we will call you to review the results.  Testing/Procedures: No test ordered today   Follow-Up: At [facility], you and your health needs are our priority.  As part of our continuing mission to provide you with exceptional heart care, our providers are all part of one team.  This team includes your primary Cardiologist (physician) and Advanced Practice Providers or APPs (Physician Assistants and Nurse Practitioners) who all work together to provide you with the care you need, when you need it.  Your next appointment:   6 month(s)  Provider:   You may see Deatrice Cage, MD or one of the following Advanced Practice Providers on your designated Care Team:    Bernardino Bring, PA-C   We recommend signing up for the patient portal called MyChart.  Sign up information is provided on this After Visit Summary.  MyChart is used to connect with patients for Virtual Visits (Telemedicine).  Patients are able to view lab/test results, encounter notes, upcoming appointments, etc.  Non-urgent messages can be sent to your provider as well.   To learn more about what you can do with MyChart, go to [email or web address].

## 2025-01-02 LAB — COMPREHENSIVE METABOLIC PANEL WITH GFR
ALT: 12 IU/L (ref 0–32)
AST: 17 IU/L (ref 0–40)
Albumin: 4.2 g/dL (ref 3.7–4.7)
Alkaline Phosphatase: 70 IU/L (ref 48–129)
BUN/Creatinine Ratio: 20 (ref 12–28)
BUN: 24 mg/dL (ref 8–27)
Bilirubin Total: 0.4 mg/dL (ref 0.0–1.2)
CO2: 23 mmol/L (ref 20–29)
Calcium: 9.4 mg/dL (ref 8.7–10.3)
Chloride: 100 mmol/L (ref 96–106)
Creatinine, Ser: 1.21 mg/dL — ABNORMAL HIGH (ref 0.57–1.00)
Globulin, Total: 2.4 g/dL (ref 1.5–4.5)
Glucose: 157 mg/dL — ABNORMAL HIGH (ref 70–99)
Potassium: 4.1 mmol/L (ref 3.5–5.2)
Sodium: 139 mmol/L (ref 134–144)
Total Protein: 6.6 g/dL (ref 6.0–8.5)
eGFR: 44 mL/min/1.73 — ABNORMAL LOW

## 2025-01-02 LAB — CBC
Hematocrit: 46.5 % (ref 34.0–46.6)
Hemoglobin: 14.5 g/dL (ref 11.1–15.9)
MCH: 28.3 pg (ref 26.6–33.0)
MCHC: 31.2 g/dL — ABNORMAL LOW (ref 31.5–35.7)
MCV: 91 fL (ref 79–97)
Platelets: 200 x10E3/uL (ref 150–450)
RBC: 5.13 x10E6/uL (ref 3.77–5.28)
RDW: 13.6 % (ref 11.7–15.4)
WBC: 5.1 x10E3/uL (ref 3.4–10.8)

## 2025-01-02 LAB — LIPID PANEL
Chol/HDL Ratio: 3.9 ratio (ref 0.0–4.4)
Cholesterol, Total: 146 mg/dL (ref 100–199)
HDL: 37 mg/dL — ABNORMAL LOW
LDL Chol Calc (NIH): 67 mg/dL (ref 0–99)
Triglycerides: 260 mg/dL — ABNORMAL HIGH (ref 0–149)
VLDL Cholesterol Cal: 42 mg/dL — ABNORMAL HIGH (ref 5–40)

## 2025-01-03 ENCOUNTER — Ambulatory Visit: Payer: Self-pay | Admitting: Physician Assistant
